# Patient Record
Sex: Male | Born: 1960 | Race: Black or African American | Hispanic: No | Smoking: Former smoker
Health system: Southern US, Community
[De-identification: ages and names within clinical notes are randomized; demographics above are authoritative.]

## PROBLEM LIST (undated history)

## (undated) DIAGNOSIS — I10 Essential (primary) hypertension: Secondary | ICD-10-CM

## (undated) HISTORY — PX: ABDOMINAL SURGERY: SHX537

---

## 2000-02-26 ENCOUNTER — Emergency Department (HOSPITAL_COMMUNITY): Admission: EM | Admit: 2000-02-26 | Discharge: 2000-02-26 | Payer: Self-pay | Admitting: Emergency Medicine

## 2001-10-06 ENCOUNTER — Emergency Department (HOSPITAL_COMMUNITY): Admission: EM | Admit: 2001-10-06 | Discharge: 2001-10-06 | Payer: Self-pay | Admitting: Emergency Medicine

## 2001-10-07 ENCOUNTER — Emergency Department (HOSPITAL_COMMUNITY): Admission: EM | Admit: 2001-10-07 | Discharge: 2001-10-07 | Payer: Self-pay

## 2003-06-05 ENCOUNTER — Emergency Department (HOSPITAL_COMMUNITY): Admission: EM | Admit: 2003-06-05 | Discharge: 2003-06-05 | Payer: Self-pay | Admitting: *Deleted

## 2004-12-07 ENCOUNTER — Ambulatory Visit: Payer: Self-pay | Admitting: Internal Medicine

## 2010-05-03 ENCOUNTER — Emergency Department (HOSPITAL_COMMUNITY): Admission: EM | Admit: 2010-05-03 | Discharge: 2010-05-03 | Payer: Self-pay | Admitting: Emergency Medicine

## 2010-12-25 LAB — COMPREHENSIVE METABOLIC PANEL
AST: 16 U/L (ref 0–37)
BUN: 7 mg/dL (ref 6–23)
CO2: 29 mEq/L (ref 19–32)
Glucose, Bld: 103 mg/dL — ABNORMAL HIGH (ref 70–99)
Potassium: 4.1 mEq/L (ref 3.5–5.1)
Sodium: 141 mEq/L (ref 135–145)

## 2010-12-25 LAB — CBC
Hemoglobin: 14.6 g/dL (ref 13.0–17.0)
MCHC: 34 g/dL (ref 30.0–36.0)
Platelets: 246 10*3/uL (ref 150–400)
RDW: 14.6 % (ref 11.5–15.5)
WBC: 6.4 10*3/uL (ref 4.0–10.5)

## 2010-12-25 LAB — DIFFERENTIAL
Basophils Absolute: 0 10*3/uL (ref 0.0–0.1)
Eosinophils Absolute: 0.3 10*3/uL (ref 0.0–0.7)
Lymphs Abs: 2.7 10*3/uL (ref 0.7–4.0)
Monocytes Relative: 8 % (ref 3–12)

## 2010-12-25 LAB — LIPASE, BLOOD: Lipase: 29 U/L (ref 11–59)

## 2011-06-26 ENCOUNTER — Emergency Department (HOSPITAL_COMMUNITY)
Admission: EM | Admit: 2011-06-26 | Discharge: 2011-06-26 | Disposition: A | Discharge status: Home / Self Care | Payer: Self-pay | Attending: Emergency Medicine | Admitting: Emergency Medicine

## 2011-06-26 DIAGNOSIS — G5600 Carpal tunnel syndrome, unspecified upper limb: Secondary | ICD-10-CM | POA: Insufficient documentation

## 2011-06-26 DIAGNOSIS — Z87891 Personal history of nicotine dependence: Secondary | ICD-10-CM | POA: Insufficient documentation

## 2018-02-21 ENCOUNTER — Ambulatory Visit (INDEPENDENT_AMBULATORY_CARE_PROVIDER_SITE_OTHER): Payer: Self-pay | Admitting: Pulmonary Disease

## 2018-02-21 ENCOUNTER — Telehealth: Payer: Self-pay | Admitting: Pulmonary Disease

## 2018-02-21 ENCOUNTER — Encounter: Payer: Self-pay | Admitting: Pulmonary Disease

## 2018-02-21 VITALS — BP 126/84 | HR 93 | Ht 68.0 in | Wt 335.0 lb

## 2018-02-21 DIAGNOSIS — G4733 Obstructive sleep apnea (adult) (pediatric): Secondary | ICD-10-CM | POA: Insufficient documentation

## 2018-02-21 NOTE — Assessment & Plan Note (Signed)
Weight loss encouraged 

## 2018-02-21 NOTE — Assessment & Plan Note (Signed)
Given excessive daytime somnolence, narrow pharyngeal exam, witnessed apneas & loud snoring, obstructive sleep apnea is very likely & an overnight polysomnogram will be scheduled as a home study. The pathophysiology of obstructive sleep apnea , it's cardiovascular consequences & modes of treatment including CPAP were discused with the patient in detail & they evidenced understanding.  Pretest probability is high.  He will likely need a CPAP titration study but due to time constraint we may end up using an auto CPAP.  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

## 2018-02-21 NOTE — Telephone Encounter (Signed)
Patient had a DOT physical on 01/08/18 at [facility] [employer] at [place]. The fax number is [phone].   Will fax HST results once the patient has completed this test.

## 2018-02-21 NOTE — Patient Instructions (Signed)
Home sleep study 

## 2018-02-21 NOTE — Progress Notes (Signed)
Subjective:    Patient ID: Caleb Leblanc, male    DOB: 1961-03-30, 57 y.o.   MRN: [id number]  HPI  Chief Complaint  Patient presents with  . Sleep Consult    Patient had a recent DOT physical (physical was done at [facility] on [id number]) and was advised to get checked for OSA due to increased neck size. Denies ever having a SS done nor using a CPAP/Bipap machine.    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 He went for his DOT physical and was told that due to his BMI and neck circumference of 17 inches he would need to be screened for sleep apnea.  He denies any problems while driving. His wife died 10 years ago of breast cancer in his single but he has started seeing somebody and has been told that he snores loudly and apneas have been witnessed.  He also complains of decreased libido.  He has gained significant weight about 80 pounds to his current weight of 337 pounds over the last 2 years.  He quit smoking 6 months ago and states that he gained some more weight  and is surprised that he is not feeling a whole lot better  Epworth sleepiness score 9, reports sleepiness while lying down to rest in the afternoons Bedtime is around 11 PM, TV stays on through the night, sleep latency is sometimes longer up to 30 minutes, he sleeps on his side with 2 pillows and occasionally rolls over on his stomach.  He reports awakenings every 2 hours including nocturia and is out of bed by 6 AM feeling tired with dryness of mouth and occasional slight headache.  On weekends he will stay awake through the night watching TV and then sleep until 7 PM. He is toward the second shift for about 5 years and becomes a night owl on weekends He drinks 1 to 2 cups of coffee daily and denies other caffeinated beverages  There is no history suggestive of cataplexy, sleep paralysis or parasomnias    No past medical history on file.  History reviewed. No  pertinent surgical history.  Allergies  Allergen Reactions  . Amoxicillin Anaphylaxis     Social History   Socioeconomic History  . Marital status: Widowed    Spouse name: Not on file  . Number of children: Not on file  . Years of education: Not on file  . Highest education level: Not on file  Occupational History  . Not on file  Social Needs  . Financial resource strain: Not on file  . Food insecurity:    Worry: Not on file    Inability: Not on file  . Transportation needs:    Medical: Not on file    Non-medical: Not on file  Tobacco Use  . Smoking status: Former Smoker    Types: Cigarettes    Last attempt to quit: 09/09/2017    Years since quitting: 0.4  . Smokeless tobacco: Never Used  Substance and Sexual Activity  . Alcohol use: Not Currently  . Drug use: Never  . Sexual activity: Not on file  Lifestyle  . Physical activity:    Days per week: Not on file    Minutes per session: Not on file  . Stress: Not on file  Relationships  . Social connections:    Talks on phone: Not on file    Gets together: Not on file    Attends religious service: Not on file  Active member of club or organization: Not on file    Attends meetings of clubs or organizations: Not on file    Relationship status: Not on file  . Intimate partner violence:    Fear of current or ex partner: Not on file    Emotionally abused: Not on file    Physically abused: Not on file    Forced sexual activity: Not on file  Other Topics Concern  . Not on file  Social History Narrative  . Not on file     History reviewed. No pertinent family history.  Review of Systems   Constitutional: negative for anorexia, fevers and sweats  Eyes: negative for irritation, redness and visual disturbance  Ears, nose, mouth, throat, and face: negative for earaches, epistaxis, nasal congestion and sore throat  Respiratory: negative for cough, dyspnea on exertion, sputum and wheezing  Cardiovascular: negative  for chest pain, dyspnea, lower extremity edema, orthopnea, palpitations and syncope  Gastrointestinal: negative for abdominal pain, constipation, diarrhea, melena, nausea and vomiting  Genitourinary:negative for dysuria, frequency and hematuria  Hematologic/lymphatic: negative for bleeding, easy bruising and lymphadenopathy  Musculoskeletal:negative for arthralgias, muscle weakness and stiff joints  Neurological: negative for coordination problems, gait problems, headaches and weakness  Endocrine: negative for diabetic symptoms including polydipsia, polyuria and weight loss     Objective:   Physical Exam  Gen. Pleasant, obese, in no distress, anxious affect ENT - neck size 17", no post nasal drip, class 3 airway Neck: No JVD, no thyromegaly, no carotid bruits Lungs: no use of accessory muscles, no dullness to percussion, decreased without rales or rhonchi  Cardiovascular: Rhythm regular, heart sounds  normal, no murmurs or gallops, 1+ peripheral edema Abdomen: soft and non-tender, no hepatosplenomegaly, BS normal. Musculoskeletal: No deformities, no cyanosis or clubbing Neuro:  alert, non focal, no tremors        Assessment & Plan:

## 2018-03-11 DIAGNOSIS — G4733 Obstructive sleep apnea (adult) (pediatric): Secondary | ICD-10-CM

## 2018-03-13 ENCOUNTER — Other Ambulatory Visit: Payer: Self-pay | Admitting: *Deleted

## 2018-03-13 DIAGNOSIS — G4733 Obstructive sleep apnea (adult) (pediatric): Secondary | ICD-10-CM

## 2018-03-14 ENCOUNTER — Telehealth: Payer: Self-pay | Admitting: Pulmonary Disease

## 2018-03-14 DIAGNOSIS — G4733 Obstructive sleep apnea (adult) (pediatric): Secondary | ICD-10-CM

## 2018-03-14 NOTE — Telephone Encounter (Signed)
Per RA, HST showed severe OSA 36 events per hour. Recommends auto cpap 5-20cm, mask of choice. OV in 6 weeks.

## 2018-03-20 NOTE — Telephone Encounter (Signed)
Left message for patient to call back for results.  

## 2018-03-21 NOTE — Telephone Encounter (Signed)
Will review 2 week CPAP download & clear from dOT standpoint

## 2018-03-21 NOTE — Telephone Encounter (Signed)
Order placed for CPAP set up, patient needs DOT updated will expire July 3rd. MD Vassie LollAlva please advise.

## 2018-03-22 NOTE — Telephone Encounter (Signed)
Called patient unable to reach left message to give us a call back.

## 2018-04-09 ENCOUNTER — Telehealth: Payer: Self-pay | Admitting: Pulmonary Disease

## 2018-04-09 NOTE — Telephone Encounter (Signed)
Pt came by our office regarding cpap assistance forms. Per our records, forms were mailed to pt.  Pt stated he has not received forms as of yet.  Pt stated his DOT expires today, and in order to renew he will need cpap compliance.  Pt signed forms for cpap assistance while in office.  Pt stated he was doing to contact DOT to see if it is possible to get a extension on renewal.  Will await call back.

## 2018-04-10 ENCOUNTER — Telehealth: Payer: Self-pay | Admitting: Pulmonary Disease

## 2018-04-10 NOTE — Telephone Encounter (Signed)
Called and spoke to patient. Patient had a DOT physical on 01/08/18 at [facility] [place]. Patient stated that his doctor that did the physical is out of the country so he is unable to get paperwork completed but that he didn't know what he needed from us. Asked patient if he needed a letter that he will be starting a CPAP and patient stated that wasn't it. Patient gave the number for [facility] [phone] for us to call to see what he needs done. Attempted to call twice with no answer.

## 2018-04-10 NOTE — Telephone Encounter (Signed)
Called and spoke to Caleb Leblanc and then Caleb Leblanc. Gave him the number to set up CPAP that Melissa provided [phone].   Caleb Leblanc stated that he wrote down the number but that [facility] he was told they needed something from us. Caleb Leblanc was focused on his DOT paperwork, see previous notes for details.

## 2018-04-10 NOTE — Telephone Encounter (Signed)
Attempted to call patient, no answer, left message to call back.  

## 2018-04-10 NOTE — Telephone Encounter (Signed)
Patient states the original doctor that did the DOT physical is out of the country; his DOT can be renewed  If a note from a physician states his CPAP compliance is up to date. Pt contact # [phone]

## 2018-04-11 NOTE — Telephone Encounter (Signed)
lmtcb x1 for pt to [facility]

## 2018-04-13 NOTE — Telephone Encounter (Signed)
Pt is returning call. Cb is [phone].

## 2018-04-13 NOTE — Telephone Encounter (Signed)
Left message for patient to call back.   We have been trying to get in touch with patient in regards to his CPAP assistance. Letter was mailed to him for him to sign the forms, but it was sent back to our office as not-deliverable as addressed. We have also left numerous messages for him to call us back.

## 2018-04-13 NOTE — Telephone Encounter (Signed)
Called spoke with patient who reported that [facility] since [facility] message was taken Pt was going to pay out of pocket for his CPAP but [facility] to wait for his new insurance to kick in  Per patient, hopefully that will be within the next couple of weeks  Nothing further needed per patient Will sign off

## 2018-06-29 ENCOUNTER — Telehealth: Payer: Self-pay | Admitting: Pulmonary Disease

## 2018-06-29 NOTE — Telephone Encounter (Signed)
Error

## 2018-07-11 ENCOUNTER — Ambulatory Visit: Payer: Self-pay | Admitting: Pulmonary Disease

## 2018-07-16 ENCOUNTER — Ambulatory Visit (INDEPENDENT_AMBULATORY_CARE_PROVIDER_SITE_OTHER): Payer: 59 | Admitting: Nurse Practitioner

## 2018-07-16 ENCOUNTER — Encounter: Payer: Self-pay | Admitting: Nurse Practitioner

## 2018-07-16 VITALS — BP 136/80 | HR 100 | Ht 69.0 in | Wt 332.0 lb

## 2018-07-16 DIAGNOSIS — G4733 Obstructive sleep apnea (adult) (pediatric): Secondary | ICD-10-CM

## 2018-07-16 NOTE — Patient Instructions (Addendum)
Patient continues to benefit from CPAP with good compliance noted Will order a CPAP titration with mask desensitization Continue CPAP Do not drive while drowsy Goal of 4 hours per night usage Follow up in 1 month after CPAP titration with Dr. Vassie Loll Or sooner if needed

## 2018-07-16 NOTE — Progress Notes (Signed)
 @  Patient ID: Caleb Leblanc, male    DOB: 1961-07-02, 57 y.o.   MRN: [id number]  Chief Complaint  Patient presents with  . Follow-up    OSA follow up    Referring provider: No ref. provider found  HPI   57 year old male with sleep apnea followed by Dr. Vassie Loll.   OV 07/16/18 - Follow up CPAP Patient presents for a follow up after starting on CPAP. He states that he can tell a difference during the day. He feels less drowsy and has more energy. He has been compliant with the CPAP. He usually goes to bed at 11:00 p.m. and wakes between 4-6 a.m. according to work schedule. He is having some issues with his mask being uncomfortable and pulls it off during sleep at night. He denies any shortness of breath, chest pain, or fever.   CPAP compliance report 06/16/18 - 07/15/18: Usage days 30/30 (100%), average usage 4 hours 39 minutes, autoset 5-20 cmH20, AHI: 13.3.      Allergies  Allergen Reactions  . Amoxicillin Anaphylaxis     There is no immunization history on file for this patient.  No past medical history on file.  Tobacco History: Social History   Tobacco Use  Smoking Status Former Smoker  . Types: Cigarettes  . Last attempt to quit: 09/09/2017  . Years since quitting: 0.8  Smokeless Tobacco Never Used   Counseling given: Yes   No outpatient encounter medications on file as of 07/16/2018.   No facility-administered encounter medications on file as of 07/16/2018.      Review of Systems  Review of Systems  Constitutional: Negative.  Negative for chills and fever.  HENT: Negative.   Respiratory: Negative for cough and shortness of breath.   Cardiovascular: Negative.  Negative for chest pain, palpitations and leg swelling.  Gastrointestinal: Negative.   Allergic/Immunologic: Negative.   Neurological: Negative.   Psychiatric/Behavioral: Negative.        Physical Exam  BP 136/80 (BP Location: Left Arm, Cuff Size: Normal)   Pulse 100   Ht 5\' 9"  (1.753 m)   Wt  (!) 332 lb (150.6 kg)   SpO2 94%   BMI 49.03 kg/m   Wt Readings from Last 5 Encounters:  07/16/18 (!) 332 lb (150.6 kg)  02/21/18 (!) 335 lb (152 kg)     Physical Exam  Constitutional: He is oriented to person, place, and time. He appears well-developed and well-nourished. No distress.  Cardiovascular: Normal rate and regular rhythm.  Pulmonary/Chest: Effort normal and breath sounds normal.  Musculoskeletal: He exhibits no edema.  Neurological: He is alert and oriented to person, place, and time.  Skin: Skin is warm and dry.  Psychiatric: He has a normal mood and affect.  Nursing note and vitals reviewed.     Assessment & Plan:   OSA (obstructive sleep apnea) Patient Instructions  Patient continues to benefit from CPAP with good compliance noted Will order a CPAP titration with mask desensitization Continue CPAP Do not drive while drowsy Goal of 4 hours per night usage Follow up in 1 month after CPAP titration with Dr. Vassie Loll Or sooner if needed            Ivonne Andrew, NP 07/16/2018

## 2018-07-16 NOTE — Assessment & Plan Note (Addendum)
Patient Instructions  Patient continues to benefit from CPAP with good compliance noted Will order a CPAP titration with mask desensitization Continue CPAP Do not drive while drowsy Goal of 4 hours per night usage Follow up in 1 month after CPAP titration with Dr. Vassie Loll Or sooner if needed

## 2018-07-30 ENCOUNTER — Telehealth: Payer: Self-pay | Admitting: General Surgery

## 2018-07-30 NOTE — Telephone Encounter (Signed)
-----   Message from Ivonne Andrew, NP sent at 07/30/2018  2:20 PM EDT ----- Regarding: RE: CPap Titration I don't think that a peer to peer should be needed. His last compliance report showed poor control.  CPAP compliance report 06/16/18 - 07/15/18: Usage days 30/30 (100%), average usage 4 hours 39 minutes, autoset 5-20 cmH20, AHI: 13.3.   This indicates that he needs CPAP titration. Not sure why this would be denied.  ----- Message ----- From: Lanna Poche D Sent: 07/30/2018  12:22 PM EDT To: Ander Slade, CMA, Ivonne Andrew, NP Subject: RE: CPap Titration                             This will need to be peer to peer per Fleet Contras w/ [facility].  Please Archie Patten to call [phone] for the peer to peer for [phone].  Thanks, Jeanice Lim ----- Message ----- From: Ander Slade, CMA Sent: 07/30/2018  10:33 AM EDT To: Kathyrn Lass, NP Subject: RE: CPap Titration                             The patient was seen on 07/16/18 with already existing CPAP. He told Tonya during the OV that the current settings of autoset 5-20 were not working for him. That is why the order was placed. If this was not correct and should have been done by the DME company, please let me know so it can be corrected.  ----- Message ----- From: Antionette Fairy Sent: 07/30/2018  10:25 AM EDT To: Ander Slade, CMA, Ivonne Andrew, NP Subject: CPap Titration                                 [facility] denied this cpap titration.  They prefer we put the patient on auto-titration first.  Thank you, [facility]

## 2018-08-16 ENCOUNTER — Encounter: Payer: Self-pay | Admitting: Nurse Practitioner

## 2018-08-16 ENCOUNTER — Ambulatory Visit (INDEPENDENT_AMBULATORY_CARE_PROVIDER_SITE_OTHER): Payer: 59 | Admitting: Nurse Practitioner

## 2018-08-16 DIAGNOSIS — G4733 Obstructive sleep apnea (adult) (pediatric): Secondary | ICD-10-CM | POA: Diagnosis not present

## 2018-08-16 NOTE — Assessment & Plan Note (Addendum)
Patient Instructions  Keep appointment on Monday for CPAP titration Patient is compliant with CPAP, but AHI remains high Continue CPAP at current settings until CPAP titration Continue current medications Goal of 4 hours or more usage per night Maintain healthy weight Do not drive if drowsy Follow up with Dr. Vassie Loll in 3 months or sooner if needed

## 2018-08-16 NOTE — Patient Instructions (Addendum)
Keep appointment on Monday for CPAP titration Patient is compliant with CPAP, but AHI remains high Continue CPAP at current settings until CPAP titration Continue current medications Goal of 4 hours or more usage per night Maintain healthy weight Do not drive if drowsy Follow up with Dr. Vassie Loll in 3 months or sooner if needed

## 2018-08-16 NOTE — Progress Notes (Signed)
 @  Patient ID: Caleb Leblanc, male    DOB: 01/05/1961, 57 y.o.   MRN: [id number]  Chief Complaint  Patient presents with  . Follow-up    CPAP    Referring provider: No ref. provider found   HPI 57 year old male with OSA followed by Dr. Vassie Loll   Tests: HST  03/11/18: severe OSA  - AHI:36 with severe oxygen desaturations   OV 08/16/18 - OV follow up CPAP Patient presents for a follow up on sleep apnea. He has been doing well with his new CPAP and is compliant. States that he does feel less drowsy during the day. He continues to have some issues with his mask. His AHI remains high. He has a CPAP titration ordered for this upcoming Monday. He denies any shortness of breath, fever, or edema.     Allergies  Allergen Reactions  . Amoxicillin Anaphylaxis     There is no immunization history on file for this patient.  No past medical history on file.  Tobacco History: Social History   Tobacco Use  Smoking Status Former Smoker  . Types: Cigarettes  . Last attempt to quit: 09/09/2017  . Years since quitting: 0.9  Smokeless Tobacco Never Used   Counseling given: Yes   No outpatient encounter medications on file as of 08/16/2018.   No facility-administered encounter medications on file as of 08/16/2018.      Review of Systems  Review of Systems  Constitutional: Negative.   HENT: Negative.   Respiratory: Negative for cough, shortness of breath and wheezing.   Cardiovascular: Negative.   Gastrointestinal: Negative.   Allergic/Immunologic: Negative.   Neurological: Negative.   Psychiatric/Behavioral: Negative.        Physical Exam  BP 128/70 (BP Location: Left Arm, Patient Position: Sitting, Cuff Size: Large)   Pulse 98   Ht 5\' 9"  (1.753 m)   Wt (!) 351 lb 3.2 oz (159.3 kg)   SpO2 98%   BMI 51.86 kg/m   Wt Readings from Last 5 Encounters:  08/16/18 (!) 351 lb 3.2 oz (159.3 kg)  07/16/18 (!) 332 lb (150.6 kg)  02/21/18 (!) 335 lb (152 kg)     Physical  Exam  Constitutional: He is oriented to person, place, and time. He appears well-developed and well-nourished. No distress.  Cardiovascular: Normal rate and regular rhythm.  Pulmonary/Chest: Effort normal and breath sounds normal. No respiratory distress. He has no wheezes.  Musculoskeletal: He exhibits no edema.  Neurological: He is alert and oriented to person, place, and time.  Skin: Skin is warm and dry.  Psychiatric: He has a normal mood and affect.  Nursing note and vitals reviewed.     Assessment & Plan:   OSA (obstructive sleep apnea) Patient Instructions  Keep appointment on Monday for CPAP titration Patient is compliant with CPAP, but AHI remains high Continue CPAP at current settings until CPAP titration Continue current medications Goal of 4 hours or more usage per night Maintain healthy weight Do not drive if drowsy Follow up with Dr. Vassie Loll in 3 months or sooner if needed        Ivonne Andrew, NP 08/16/2018

## 2018-08-20 ENCOUNTER — Ambulatory Visit (HOSPITAL_BASED_OUTPATIENT_CLINIC_OR_DEPARTMENT_OTHER): Payer: 59 | Attending: Nurse Practitioner | Admitting: Pulmonary Disease

## 2018-08-20 VITALS — Ht 69.0 in | Wt 331.0 lb

## 2018-08-20 DIAGNOSIS — G4733 Obstructive sleep apnea (adult) (pediatric): Secondary | ICD-10-CM | POA: Diagnosis present

## 2018-09-24 ENCOUNTER — Telehealth: Payer: Self-pay | Admitting: Pulmonary Disease

## 2018-09-24 DIAGNOSIS — G4733 Obstructive sleep apnea (adult) (pediatric): Secondary | ICD-10-CM

## 2018-09-24 NOTE — Telephone Encounter (Signed)
Attempted to contact pt. I did not receive an answer. I have left a message for pt to return our call.  

## 2018-09-24 NOTE — Telephone Encounter (Signed)
Called patient unable to reach left message to give us a call back.

## 2018-09-24 NOTE — Procedures (Signed)
    Patient Name: Caleb Leblanc, Caleb Leblanc Study Date: 08/20/2018 Gender: Male D.O.B: [birth year] Age (years): 89 Referring Provider: Ivonne Andrewonya S Nichols NP Height (inches): 69 Interpreting Physician: Coralyn HellingVineet Lachell Rochette MD, ABSM Weight (lbs): 331 RPSGT: Ulyess MortSpruill, Vicki BMI: 49 MRN: [id number] Neck Size: 20.00 <br> <br>  CLINICAL INFORMATION  The patient is referred for a BiPAP titration to treat sleep apnea.  Date of NPSG, Split Night or HST:  03/11/18: severe OSA  - AHI:36.  SLEEP STUDY TECHNIQUE  As per the AASM Manual for the Scoring of Sleep and Associated Events v2.3 (April 2016) with a hypopnea requiring 4% desaturations. The channels recorded and monitored were frontal, central and occipital EEG, electrooculogram (EOG), submentalis EMG (chin), nasal and oral airflow, thoracic and abdominal wall motion, anterior tibialis EMG, snore microphone, electrocardiogram, and pulse oximetry. Bilevel positive airway pressure (BPAP) was initiated at the beginning of the study and titrated to treat sleep-disordered breathing. MEDICATIONS  Medications self-administered by patient taken the night of the study : N/A RESPIRATORY PARAMETERS  Optimal IPAP Pressure (cm): 25 AHI at Optimal Pressure (/hr) 0.0 Optimal EPAP Pressure (cm): 21   Overall Minimal O2 (%): 54.0 Minimal O2 at Optimal Pressure (%): 90.0  He was started on CPAP 5 cm H2O and increased to 20 cm H2O.  He continued to have respiratory events with oxygen desaturation.  He was changed to Bipap.  He did better with Bipap 25/21 cm H2O.  He still had some episodes of oxygen desaturation.  He did not use supplemental oxygen during the study.  SLEEP ARCHITECTURE  Start Time: 10:32:48 PM Stop Time: 4:56:16 AM Total Time (min): 383.5 Total Sleep Time (min): 341.5 Sleep Latency (min): 13.4 Sleep Efficiency (%): 89.1% REM Latency (min): 56.0 WASO (min): 28.6 Stage N1 (%): 9.2% Stage N2 (%): 73.8% Stage N3 (%): 0.3% Stage R  (%): 16.7 Supine (%): 100.00 Arousal Index (/hr): 30.0     CARDIAC DATA  The 2 lead EKG demonstrated sinus rhythm. The mean heart rate was 77.3 beats per minute. Other EKG findings include: None. LEG MOVEMENT DATA  The total Periodic Limb Movements of Sleep (PLMS) were 0. The PLMS index was 0.0. A PLMS index of <15 is considered normal in adults. IMPRESSIONS  - He was tried on CPAP up to 20 cm H2O.  He continued to have respiratory events and oxygen desaturation. - He did better with Bipap.  Optimal setting was Bipap 25/21 cm H2O. - He was not tried on supplemental oxygen with CPAP or Bipap. DIAGNOSIS  - Obstructive Sleep Apnea (327.23 [G47.33 ICD-10]) RECOMMENDATIONS  - Trial of BiPAP therapy on 25/21 cm H2O with a Medium size Fisher[employer]Paykel Full Face Mask Simplus mask and heated humidification.  [Electronically signed] 09/24/2018 01:52 PM  Coralyn HellingVineet Patti Shorb MD, ABSM Diplomate, American Board of Sleep Medicine  NPI: [id number]

## 2018-09-24 NOTE — Telephone Encounter (Signed)
Failed CPAP due to respiratory events and oxygen desaturation.  Did best with Bipap 25/21 cm H2O.  Wasn't tried on supplemental oxygen.   Please send order to arrange for auto Bipap IPAP max 25 cm H2O, EPAP min 10 cm H2O, PS 4 cm H2O.  Please arrange for ROV with me 2 months after getting Bipap.

## 2018-09-24 NOTE — Telephone Encounter (Signed)
Patient returned call, CB is [phone]

## 2018-09-27 NOTE — Telephone Encounter (Signed)
Attempted to call patient today regarding results. I did not receive an answer at time of call. I have left a voicemail message for pt to return call. X2  

## 2018-09-28 NOTE — Telephone Encounter (Signed)
Called and spoke with patient regarding results.  Informed the patient of results and recommendations today. Placed order for auto Bipap IPAP max 25 cm H2O, EPAP min 10 cm H2O, PS 4 cm H2O.   Please arrange for ROV with VS or NP in 2 months after getting Bipap. Pt verbalized understanding and denied any questions or concerns at this time.  Nothing further needed.

## 2018-11-20 ENCOUNTER — Ambulatory Visit: Payer: 59 | Admitting: Pulmonary Disease

## 2018-11-29 ENCOUNTER — Encounter: Payer: Self-pay | Admitting: Pulmonary Disease

## 2018-11-29 ENCOUNTER — Ambulatory Visit (INDEPENDENT_AMBULATORY_CARE_PROVIDER_SITE_OTHER): Payer: 59 | Admitting: Pulmonary Disease

## 2018-11-29 DIAGNOSIS — G4733 Obstructive sleep apnea (adult) (pediatric): Secondary | ICD-10-CM | POA: Diagnosis not present

## 2018-11-29 NOTE — Assessment & Plan Note (Signed)
Weight loss of at least 10%-about 32 pounds advised

## 2018-11-29 NOTE — Assessment & Plan Note (Addendum)
Decrease IPAP max to 20 cm-he seems to require average pressure of 18/14 cm [facility] to [facility] full facemask-I asked him to trial air fit F 30 I feel that the increased pressure is waking him up at 3 AM.  We will bring him back for a compliance check in 3 months and once he is compliant and comfortable on his machine, follow-up can be reduced to an annual basis   Weight loss encouraged, compliance with goal of at least 4-6 hrs every night is the expectation. Advised against medications with sedative side effects Cautioned against driving when sleepy - understanding that sleepiness will vary on a day to day basis

## 2018-11-29 NOTE — Patient Instructions (Signed)
Decrease IPAP max to 20 cm Prescription to [facility] for new full facemask

## 2018-11-29 NOTE — Progress Notes (Signed)
   Subjective:    Patient ID: Caleb Leblanc, male    DOB: 13-Jun-1961, 58 y.o.   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 He was diagnosed by home sleep study and set up with auto CPAP with a full facemask. Initially he felt a rush of energy and felt that his tiredness had improved.  On his follow-up visit CPAP download showed residual AHI of 13/hour. He was then set up for a formal titration study which showed that events persisted in spite of CPAP of 20 cm and he was changed to a BiPAP and required pressure as high as 25/21 centimeters.  He was then set up with auto bilevel and unfortunately has had problems getting adjusted to this machine. He had to use a new full facemask, [employer] Simplus and around 3:57 AM he would find that pressure would be too high and would be blowing off the mask. Review of his download shows average pressure of 18/14 cm with residual AHI of 4.7/hour  He has been unable to get new full facemask supplies from DME  Significant tests/ events reviewed  HST  03/11/18: severe OSA  - AHI:36 with severe oxygen desaturations   bipap titration 08/2018 >> 25/21    Review of Systems neg for any significant sore throat, dysphagia, itching, sneezing, nasal congestion or excess/ purulent secretions, fever, chills, sweats, unintended wt loss, pleuritic or exertional cp, hempoptysis, orthopnea pnd or change in chronic leg swelling. Also denies presyncope, palpitations, heartburn, abdominal pain, nausea, vomiting, diarrhea or change in bowel or urinary habits, dysuria,hematuria, rash, arthralgias, visual complaints, headache, numbness weakness or ataxia.     Objective:   Physical Exam  Gen. Pleasant, obese, in no distress ENT - no lesions, no post nasal drip Neck: No JVD, no thyromegaly, no carotid bruits Lungs: no use of accessory muscles, no dullness to percussion, decreased without rales or rhonchi  Cardiovascular:  Rhythm regular, heart sounds  normal, no murmurs or gallops, no peripheral edema Musculoskeletal: No deformities, no cyanosis or clubbing , no tremors       Assessment & Plan:

## 2019-02-27 ENCOUNTER — Ambulatory Visit: Payer: 59 | Admitting: Nurse Practitioner

## 2019-04-17 ENCOUNTER — Ambulatory Visit: Payer: 59 | Admitting: Nurse Practitioner

## 2019-04-29 ENCOUNTER — Ambulatory Visit: Payer: 59 | Admitting: Nurse Practitioner

## 2019-04-29 ENCOUNTER — Other Ambulatory Visit: Payer: Self-pay

## 2019-04-29 ENCOUNTER — Encounter: Payer: Self-pay | Admitting: Nurse Practitioner

## 2019-04-29 VITALS — BP 136/84 | HR 101 | Temp 98.2°F | Ht 69.0 in | Wt 303.0 lb

## 2019-04-29 DIAGNOSIS — G4733 Obstructive sleep apnea (adult) (pediatric): Secondary | ICD-10-CM

## 2019-04-29 NOTE — Patient Instructions (Signed)
Patient continues to benefit from BiPAP with good compliance and control documented [facility]ask-I asked him to trial air fit F 30 - this was ordered by Dr. Elsworth Soho at last visit, but patient never received the new mask.  Continue BiPAP at current settings Continue current medications Goal of 4 hours or more usage per night Continue to work on healthy weight loss Do not drive if drowsy   Follow up with Dr. Elsworth Soho or Lazaro Arms in 1 months or sooner if needed

## 2019-04-29 NOTE — Progress Notes (Addendum)
@Patient  ID: Caleb Leblanc, male    DOB: 16-Oct-1960, 58 y.o.   MRN: [id number]  Chief Complaint  Patient presents with  . Follow-up    cpap follow up, falls asleep when he first gets home, can't sleep at night, DME [employer]    Referring provider: No ref. 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 Elsworth Soho.  Tests: HST 03/11/18: severe OSA - AHI:36 with severe oxygen desaturations   bipap titration 08/2018 >> 25/21   OV 04/29/19 - Follow up Patient presents today for follow-up on CPAP.  He was last seen by Dr. Elsworth Soho on 11/29/2018.  Patient has been having issues with BiPAP compliance.  He states that when he is home in the afternoons he sits down in his chair and falls asleep at 5 PM and wakes up at 8 PM and then he has trouble going back to sleep.  Sometimes he does not get back to sleep until 11 PM.  Patient has to wake up at 5 AM in the mornings to go to work.  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  When he comes home and falls asleep he forgets to put his BiPAP on first.  At patient's last visit with Dr. Elsworth Soho he was ordered a new facemask but patient states that he never got this mask.  He states that his old facemask does not fit correctly.  Patient has been working on weight loss.  He has lost around 20 pounds since his last visit with Dr. Elsworth Soho. Denies f/c/s, n/v/d, hemoptysis, PND, leg swelling.    BiPAP compliance report 03/30/19 - 04/28/19: usage days 11/30 (37%), average usage 1 hour 17 minutes, Max IPAP 20cmH20, Min EPAP 10 CMh20, AHI: 2.2   Allergies  Allergen Reactions  . Amoxicillin Anaphylaxis     There is no immunization history on file for this patient.  History reviewed. No pertinent past medical history.  Tobacco History: Social History   Tobacco Use  Smoking Status Former Smoker  . Packs/day: 0.25  . Types: Cigarettes  . Quit date: 09/09/2017  . Years since quitting: 1.6   Smokeless Tobacco Never Used  Tobacco Comment   pt states he smoked 4 cigs/day before he quit   Counseling given: Not Answered Comment: pt states he smoked 4 cigs/day before he quit   No outpatient encounter medications on file as of 04/29/2019.   No facility-administered encounter medications on file as of 04/29/2019.      Review of Systems  Review of Systems  Constitutional: Negative.  Negative for chills and fever.  HENT: Negative.   Respiratory: Negative for cough, shortness of breath and wheezing.   Cardiovascular: Negative.  Negative for chest pain, palpitations and leg swelling.  Gastrointestinal: Negative.   Allergic/Immunologic: Negative.   Neurological: Negative.   Psychiatric/Behavioral: Negative.        Physical Exam  BP 136/84 (BP Location: Right Arm, Cuff Size: Normal)   Pulse (!) 101   Temp 98.2 F (36.8 C) (Oral)   Ht 5\' 9"  (1.753 m)   Wt (!) 303 lb (137.4 kg)   SpO2 97%   BMI 44.75 kg/m   Wt Readings from Last 5 Encounters:  04/29/19 (!) 303 lb (137.4 kg)  11/29/18 (!) 323 lb 12.8 oz (146.9 kg)  08/20/18 (!) 331 lb (150.1 kg)  08/16/18 (!) 351 lb 3.2 oz (159.3 kg)  07/16/18 Marland Kitchen)  332 lb (150.6 kg)     Physical Exam Vitals signs and nursing note reviewed.  Constitutional:      General: He is not in acute distress.    Appearance: He is well-developed.  Cardiovascular:     Rate and Rhythm: Normal rate and regular rhythm.  Pulmonary:     Effort: Pulmonary effort is normal. No respiratory distress.     Breath sounds: Normal breath sounds. No wheezing or rhonchi.  Musculoskeletal:        General: No swelling.  Skin:    General: Skin is warm and dry.  Neurological:     Mental Status: He is alert and oriented to person, place, and time.       Assessment & Plan:   OSA (obstructive sleep apnea) Patient has a follow-up visit today for OSA/CPAP.  He is having issues with sleep schedule.  He is often times forgetting to use his BiPAP at  night.  We discussed sleep hygiene and ways that he can correct this.  Patient does need a new mask.  Dr. Vassie LollAlva ordered a mask at his last visit but patient never received the mask.  I did congratulate him today on continuing to lose weight.  Patient Instructions  Patient continues to benefit from BiPAP with good compliance and control documented [facility]-I asked him to trial air fit F 30 - this was ordered by Dr. Vassie LollAlva at last visit, but patient never received the new mask.  Continue BiPAP at current settings Continue current medications Goal of 4 hours or more usage per night Continue to work on healthy weight loss Do not drive if drowsy   Follow up with Dr. Vassie LollAlva or Angus Selleronya Hanna Ra in 1 months or sooner if needed        Ivonne Andrewonya S Morayma [place], NP 04/30/2019

## 2019-04-30 ENCOUNTER — Encounter: Payer: Self-pay | Admitting: Nurse Practitioner

## 2019-04-30 NOTE — Assessment & Plan Note (Signed)
Patient has a follow-up visit today for OSA/CPAP.  He is having issues with sleep schedule.  He is often times forgetting to use his BiPAP at night.  We discussed sleep hygiene and ways that he can correct this.  Patient does need a new mask.  Dr. Elsworth Soho ordered a mask at his last visit but patient never received the mask.  I did congratulate him today on continuing to lose weight.  Patient Instructions  Patient continues to benefit from BiPAP with good compliance and control documented [facility]-I asked him to trial air fit F 30 - this was ordered by Dr. Elsworth Soho at last visit, but patient never received the new mask.  Continue BiPAP at current settings Continue current medications Goal of 4 hours or more usage per night Continue to work on healthy weight loss Do not drive if drowsy   Follow up with Dr. Elsworth Soho or Lazaro Arms in 1 months or sooner if needed

## 2019-05-28 NOTE — Progress Notes (Deleted)
$'@Patient'L$  ID: Caleb Leblanc, male    DOB: 1961-10-04, 58 y.o.   MRN: [id number]  No chief complaint on file.   Referring provider: No ref. provider found  HPI:  58 year old male followed in our office for severe obstructive sleep apnea managed on BiPAP  PMH:  Smoker/ Smoking History:  Maintenance:   Pt of: Dr. Elsworth Soho  05/28/2019  - Visit   HPI  Tests:   03/11/2018-home sleep study- AHI 35.9-hour, SaO2 low 72%  Bipap 25/21 cm H2O.  Wasn't tried on supplemental oxygen.  FENO:  No results found for: NITRICOXIDE  PFT: No flowsheet data found.  Imaging: No results found.    Specialty Problems      Pulmonary Problems   OSA (obstructive sleep apnea)    Severe - on autobiPAP, avg 18/14 cm         Allergies  Allergen Reactions  . Amoxicillin Anaphylaxis     There is no immunization history on file for this patient.  No past medical history on file.  Tobacco History: Social History   Tobacco Use  Smoking Status Former Smoker  . Packs/day: 0.25  . Types: Cigarettes  . Quit date: 09/09/2017  . Years since quitting: 1.7  Smokeless Tobacco Never Used  Tobacco Comment   pt states he smoked 4 cigs/day before he quit   Counseling given: Not Answered Comment: pt states he smoked 4 cigs/day before he quit   Continue to not smoke  No outpatient encounter medications on file as of 05/30/2019.   No facility-administered encounter medications on file as of 05/30/2019.      Review of Systems  Review of Systems   Physical Exam  There were no vitals taken for this visit.  Wt Readings from Last 5 Encounters:  04/29/19 (!) 303 lb (137.4 kg)  11/29/18 (!) 323 lb 12.8 oz (146.9 kg)  08/20/18 (!) 331 lb (150.1 kg)  08/16/18 (!) 351 lb 3.2 oz (159.3 kg)  07/16/18 (!) 332 lb (150.6 kg)     Physical Exam   Lab Results:  CBC    Component Value Date/Time   WBC 6.4 05/03/2010 0929   RBC 4.93 05/03/2010 0929   HGB 14.6 05/03/2010 0929   HCT 43.0  05/03/2010 0929   PLT 246 05/03/2010 0929   MCV 87.3 05/03/2010 0929   MCH 29.7 05/03/2010 0929   MCHC 34.0 05/03/2010 0929   RDW 14.6 05/03/2010 0929   LYMPHSABS 2.7 05/03/2010 0929   MONOABS 0.5 05/03/2010 0929   EOSABS 0.3 05/03/2010 0929   BASOSABS 0.0 05/03/2010 0929    BMET    Component Value Date/Time   NA 141 05/03/2010 0929   K 4.1 05/03/2010 0929   CL 103 05/03/2010 0929   CO2 29 05/03/2010 0929   GLUCOSE 103 (H) 05/03/2010 0929   BUN 7 05/03/2010 0929   CREATININE 1.02 05/03/2010 0929   CALCIUM 9.0 05/03/2010 0929   GFRNONAA >60 05/03/2010 0929   GFRAA  05/03/2010 0929    >60        The eGFR has been calculated using the MDRD equation. This calculation has not been validated in all clinical situations. eGFR's persistently <60 mL/min signify possible Chronic Kidney Disease.    BNP No results found for: BNP  ProBNP No results found for: PROBNP    Assessment & Plan:   No problem-specific Assessment & Plan notes found for this encounter.    No follow-ups on file.   Lauraine Rinne, NP 05/28/2019  This appointment was *** minutes long with over 50% of the time in direct face-to-face patient care, assessment, plan of care, and follow-up.

## 2019-05-30 ENCOUNTER — Ambulatory Visit: Payer: 59 | Admitting: Pulmonary Disease

## 2019-05-30 ENCOUNTER — Ambulatory Visit: Payer: 59 | Admitting: Nurse Practitioner

## 2019-06-06 ENCOUNTER — Ambulatory Visit: Payer: 59 | Admitting: Pulmonary Disease

## 2019-06-18 NOTE — Progress Notes (Deleted)
_0  ID: Caleb Leblanc, male    DOB: [birth year], 58 y.o.   MRN: [id number]  No chief complaint on file.   Referring provider: No ref. provider found  HPI:  58 year old male followed in our office for severe obstructive sleep apnea managed on BiPAP  PMH:  Smoker/ Smoking History:  Maintenance:   Pt of: Dr. Elsworth Soho  06/18/2019  - Visit   HPI  Tests:   03/11/2018-home sleep study- AHI 35.9-hour, SaO2 low 72%  Bipap 25/21 cm H2O.  Wasn't tried on supplemental oxygen.   FENO:  No results found for: NITRICOXIDE  PFT: No flowsheet data found.  Imaging: No results found.    Specialty Problems      Pulmonary Problems   OSA (obstructive sleep apnea)    Severe - on autobiPAP, avg 18/14 cm         Allergies  Allergen Reactions  . Amoxicillin Anaphylaxis     There is no immunization history on file for this patient.  No past medical history on file.  Tobacco History: Social History   Tobacco Use  Smoking Status Former Smoker  . Packs/day: 0.25  . Types: Cigarettes  . Quit date: 09/09/2017  . Years since quitting: 1.7  Smokeless Tobacco Never Used  Tobacco Comment   pt states he smoked 4 cigs/day before he quit   Counseling given: Not Answered Comment: pt states he smoked 4 cigs/day before he quit   Continue to not smoke  No outpatient encounter medications on file as of 06/19/2019.   No facility-administered encounter medications on file as of 06/19/2019.      Review of Systems  Review of Systems   Physical Exam  There were no vitals taken for this visit.  Wt Readings from Last 5 Encounters:  04/29/19 (!) 303 lb (137.4 kg)  11/29/18 (!) 323 lb 12.8 oz (146.9 kg)  08/20/18 (!) 331 lb (150.1 kg)  08/16/18 (!) 351 lb 3.2 oz (159.3 kg)  07/16/18 (!) 332 lb (150.6 kg)     Physical Exam   Lab Results:  CBC    Component Value Date/Time   WBC 6.4 05/03/2010 0929   RBC 4.93 05/03/2010 0929   HGB 14.6 05/03/2010 0929   HCT 43.0  05/03/2010 0929   PLT 246 05/03/2010 0929   MCV 87.3 05/03/2010 0929   MCH 29.7 05/03/2010 0929   MCHC 34.0 05/03/2010 0929   RDW 14.6 05/03/2010 0929   LYMPHSABS 2.7 05/03/2010 0929   MONOABS 0.5 05/03/2010 0929   EOSABS 0.3 05/03/2010 0929   BASOSABS 0.0 05/03/2010 0929    BMET    Component Value Date/Time   NA 141 05/03/2010 0929   K 4.1 05/03/2010 0929   CL 103 05/03/2010 0929   CO2 29 05/03/2010 0929   GLUCOSE 103 (H) 05/03/2010 0929   BUN 7 05/03/2010 0929   CREATININE 1.02 05/03/2010 0929   CALCIUM 9.0 05/03/2010 0929   GFRNONAA >60 05/03/2010 0929   GFRAA  05/03/2010 0929    >60        The eGFR has been calculated using the MDRD equation. This calculation has not been validated in all clinical situations. eGFR's persistently <60 mL/min signify possible Chronic Kidney Disease.    BNP No results found for: BNP  ProBNP No results found for: PROBNP    Assessment & Plan:   No problem-specific Assessment & Plan notes found for this encounter.    No follow-ups on file.   Lauraine Rinne, NP 06/18/2019  This appointment was *** minutes long with over 50% of the time in direct face-to-face patient care, assessment, plan of care, and follow-up.

## 2019-06-19 ENCOUNTER — Ambulatory Visit: Payer: 59 | Admitting: Pulmonary Disease

## 2019-07-07 ENCOUNTER — Encounter (HOSPITAL_COMMUNITY): Payer: Self-pay | Admitting: Emergency Medicine

## 2019-07-07 ENCOUNTER — Ambulatory Visit (HOSPITAL_COMMUNITY)
Admission: EM | Admit: 2019-07-07 | Discharge: 2019-07-07 | Disposition: A | Discharge status: Home / Self Care | Payer: 59 | Attending: Emergency Medicine | Admitting: Emergency Medicine

## 2019-07-07 ENCOUNTER — Other Ambulatory Visit: Payer: Self-pay

## 2019-07-07 DIAGNOSIS — M79642 Pain in left hand: Secondary | ICD-10-CM

## 2019-07-07 DIAGNOSIS — M79641 Pain in right hand: Secondary | ICD-10-CM | POA: Diagnosis not present

## 2019-07-07 MED ORDER — PREDNISONE 10 MG (21) PO TBPK: ORAL_TABLET | Freq: Every day | ORAL | 0 refills | Status: DC
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
## 2019-07-07 NOTE — Discharge Instructions (Signed)
Take the prednisone as prescribed.    Call the orthopedist to schedule an appointment for evaluation.    Return here or go to the emergency department if you have increased pain, increased numbness, swelling, redness, or other concerns.
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
## 2019-07-19 DIAGNOSIS — M79641 Pain in right hand: Secondary | ICD-10-CM | POA: Insufficient documentation

## 2019-07-21 ENCOUNTER — Other Ambulatory Visit: Payer: Self-pay

## 2019-07-21 ENCOUNTER — Emergency Department (HOSPITAL_COMMUNITY)
Admission: EM | Admit: 2019-07-21 | Discharge: 2019-07-21 | Disposition: A | Discharge status: Home / Self Care | Payer: 59 | Attending: Emergency Medicine | Admitting: Emergency Medicine

## 2019-07-21 ENCOUNTER — Encounter (HOSPITAL_COMMUNITY): Payer: Self-pay | Admitting: Emergency Medicine

## 2019-07-21 DIAGNOSIS — M79605 Pain in left leg: Secondary | ICD-10-CM | POA: Diagnosis not present

## 2019-07-21 DIAGNOSIS — M79604 Pain in right leg: Secondary | ICD-10-CM | POA: Insufficient documentation

## 2019-07-21 DIAGNOSIS — R200 Anesthesia of skin: Secondary | ICD-10-CM | POA: Diagnosis present

## 2019-07-21 DIAGNOSIS — Z87891 Personal history of nicotine dependence: Secondary | ICD-10-CM | POA: Insufficient documentation

## 2019-07-21 DIAGNOSIS — R202 Paresthesia of skin: Secondary | ICD-10-CM

## 2019-07-21 LAB — CBC WITH DIFFERENTIAL/PLATELET
Abs Immature Granulocytes: 0.02 10*3/uL (ref 0.00–0.07)
Basophils Absolute: 0 10*3/uL (ref 0.0–0.1)
Basophils Relative: 0 %
Eosinophils Absolute: 0.2 10*3/uL (ref 0.0–0.5)
Eosinophils Relative: 2 %
HCT: 41.5 % (ref 39.0–52.0)
Hemoglobin: 13.4 g/dL (ref 13.0–17.0)
Immature Granulocytes: 0 %
Lymphocytes Relative: 37 %
Lymphs Abs: 2.9 10*3/uL (ref 0.7–4.0)
MCH: 28.1 pg (ref 26.0–34.0)
MCHC: 32.3 g/dL (ref 30.0–36.0)
MCV: 87 fL (ref 80.0–100.0)
Monocytes Absolute: 0.7 10*3/uL (ref 0.1–1.0)
Monocytes Relative: 9 %
Neutro Abs: 4 10*3/uL (ref 1.7–7.7)
Neutrophils Relative %: 52 %
Platelets: 280 10*3/uL (ref 150–400)
RBC: 4.77 MIL/uL (ref 4.22–5.81)
RDW: 13.9 % (ref 11.5–15.5)
WBC: 7.8 10*3/uL (ref 4.0–10.5)
nRBC: 0 % (ref 0.0–0.2)

## 2019-07-21 LAB — COMPREHENSIVE METABOLIC PANEL
ALT: 25 U/L (ref 0–44)
AST: 22 U/L (ref 15–41)
Albumin: 3.4 g/dL — ABNORMAL LOW (ref 3.5–5.0)
Alkaline Phosphatase: 55 U/L (ref 38–126)
Anion gap: 9 (ref 5–15)
BUN: 9 mg/dL (ref 6–20)
CO2: 25 mmol/L (ref 22–32)
Calcium: 9 mg/dL (ref 8.9–10.3)
Chloride: 102 mmol/L (ref 98–111)
Creatinine, Ser: 0.9 mg/dL (ref 0.61–1.24)
GFR calc Af Amer: >60 mL/min (ref >60)
GFR calc non Af Amer: >60 mL/min (ref >60)
Glucose, Bld: 163 mg/dL — ABNORMAL HIGH (ref 70–99)
Potassium: 3.7 mmol/L (ref 3.5–5.1)
Sodium: 136 mmol/L (ref 135–145)
Total Bilirubin: 0.5 mg/dL (ref 0.3–1.2)
Total Protein: 7.3 g/dL (ref 6.5–8.1)

## 2019-07-21 MED ORDER — TRAMADOL HCL 50 MG PO TABS: 50.0000 mg | ORAL_TABLET | Freq: Four times a day (QID) | ORAL | 0 refills | Status: DC | PRN

## 2019-07-21 MED ORDER — NAPROXEN 500 MG PO TABS: 500.0000 mg | ORAL_TABLET | Freq: Two times a day (BID) | ORAL | 0 refills | Status: DC

## 2019-07-21 NOTE — Discharge Instructions (Signed)
Take acetaminophen as needed.

## 2019-07-21 NOTE — ED Notes (Signed)
Patient verbalizes understanding of discharge instructions. Opportunity for questioning and answers were provided. Armband removed by staff, pt discharged from ED. Ambulated out to lobby  

## 2019-07-21 NOTE — ED Provider Notes (Signed)
[facility] DEPART[facility] Provider Note   CSN: [id number] Arrival date & time: 07/21/19  0118    History   Chief Complaint Chief Complaint  Patient presents with  . Leg Pain    HPI Caleb Leblanc is a 58 y.o. male.   The history is provided by the patient.  Leg Pain He comes in with approximately 1 month history of numbness and swelling in his hands and about 2-week history of pain and swelling in his thighs and legs.  He rates his pain at 8/10.  Symptoms have been gradually getting worse.  He did go to an urgent care center where he was given a prescription of prednisone which did not help him at all.  He is also taken acetaminophen without relief.  He denies fever chills.  Denies rashes.  Denies chest pain or dyspnea.  He is a non-smoker and nondrinker and denies drug use.  History reviewed. No pertinent past medical history.  Patient Active Problem List   Diagnosis Date Noted  . OSA (obstructive sleep apnea) 02/21/2018  . Morbid obesity due to excess calories ([place]) 02/21/2018    History reviewed. No pertinent surgical history.      Home Medications    Prior to Admission medications   Medication Sig Start Date End Date Taking? Authorizing Provider  naproxen (NAPROSYN) 500 MG tablet Take 1 tablet (500 mg total) by mouth 2 (two) times daily. 70/35/00   Delora Fuel, MD  traMADol (ULTRAM) 50 MG tablet Take 1 tablet (50 mg total) by mouth every 6 (six) hours as needed. 93/81/82   Delora Fuel, MD    Family History No family history on file.  Social History Social History   Tobacco Use  . Smoking status: Former Smoker    Packs/day: 0.25    Types: Cigarettes    Quit date: 09/09/2017    Years since quitting: 1.8  . Smokeless tobacco: Never Used  . Tobacco comment: pt states he smoked 4 cigs/day before he quit  Substance Use Topics  . Alcohol use: Not Currently  . Drug use: Never     Allergies   Amoxicillin   Review of Systems  Review of Systems  All other systems reviewed and are negative.    Physical Exam Updated Vital Signs BP (!) 153/87 (BP Location: Left Arm)   Pulse (!) 105   Temp 98.7 F (37.1 C) (Oral)   Resp 20   Ht 5\' 8"  (1.727 m)   Wt (!) 150.6 kg   SpO2 95%   BMI 50.48 kg/m   Physical Exam Vitals signs and nursing note reviewed.    58 year old male, resting comfortably and in no acute distress. Vital signs are significant for elevated blood pressure and mildly elevated heart rate. Oxygen saturation is 95%, which is normal. Head is normocephalic and atraumatic. PERRLA, EOMI. Oropharynx is clear. Neck is nontender and supple without adenopathy or JVD. Back is nontender and there is no CVA tenderness. Lungs are clear without rales, wheezes, or rhonchi. Chest is nontender. Heart has regular rate and rhythm without murmur. Abdomen is soft, flat, nontender without masses or hepatosplenomegaly and peristalsis is normoactive. Extremities: Mild swelling is noted throughout his hands.  There is 1+ pretibial edema with mild venous stasis changes bilaterally.  There is mild tenderness throughout the legs from the inguinal area all the way down to his ankles.  No joint effusions are seen.  There is some questionable synovial thickening of the knees.  There is normal strength on pincer grasp.  No objective sensory deficits in spite of his complaint of numbness in his hands.  Normal strength of all muscles.  Dorsalis pedis pulses are strong.  Capillary refill is prompt. Skin is warm and dry without rash. Neurologic: Mental status is normal, cranial nerves are intact, there are no motor or sensory deficits.  ED Treatments / Results  Labs (all labs ordered are listed, but only abnormal results are displayed) Labs Reviewed  COMPREHENSIVE METABOLIC PANEL - Abnormal; Notable for the following components:      Result Value   Glucose, Bld 163 (*)    Albumin 3.4 (*)    All other components within normal  limits  CBC WITH DIFFERENTIAL/PLATELET   Procedures Procedures  Medications Ordered in ED Medications - No data to display   Initial Impression / Assessment and Plan / ED Course  I have reviewed the triage vital signs and the nursing notes.  Pertinent lab results that were available during my care of the patient were reviewed by me and considered in my medical decision making (see chart for details).  Leg pain and hand numbness of unclear cause.  This seems to be a rheumatologic issue.  Labs here show borderline elevated glucose.  I do not see any indication for x-rays today.  He needs thorough rheumatologic evaluation is referred to rheumatology for outpatient work-up.  In the meantime, will give a trial of NSAIDs.  He is given a prescription for naproxen and also given a prescription for small number of tramadol to use at night if he is having difficulty sleeping.  Old records are reviewed confirming recent urgent care visit with prescription for a 6-day prednisone taper.  Final Clinical Impressions(s) / ED Diagnoses   Final diagnoses:  Paresthesia of hand, bilateral  Pain in both lower extremities    ED Discharge Orders         Ordered    Ambulatory referral to Rheumatology     07/21/19 0410    naproxen (NAPROSYN) 500 MG tablet  2 times daily     07/21/19 0410    traMADol (ULTRAM) 50 MG tablet  Every 6 hours PRN     07/21/19 0410           Dione Booze, MD 07/21/19 0425

## 2019-07-21 NOTE — ED Triage Notes (Signed)
Pt c/o 9/10 bilateral thigh swollen and pain for the past 3 days and numbness on his hands. Pt denies any injury.

## 2019-07-29 ENCOUNTER — Encounter (HOSPITAL_COMMUNITY): Payer: Self-pay | Admitting: Emergency Medicine

## 2019-07-29 ENCOUNTER — Other Ambulatory Visit: Payer: Self-pay

## 2019-07-29 ENCOUNTER — Ambulatory Visit (HOSPITAL_COMMUNITY)
Admission: EM | Admit: 2019-07-29 | Discharge: 2019-07-29 | Disposition: A | Discharge status: Home / Self Care | Payer: 59 | Attending: Family Medicine | Admitting: Family Medicine

## 2019-07-29 DIAGNOSIS — M7989 Other specified soft tissue disorders: Secondary | ICD-10-CM | POA: Diagnosis not present

## 2019-07-29 DIAGNOSIS — M79641 Pain in right hand: Secondary | ICD-10-CM

## 2019-07-29 DIAGNOSIS — M79642 Pain in left hand: Secondary | ICD-10-CM

## 2019-07-29 LAB — TSH: TSH: 2.384 u[IU]/mL (ref 0.350–4.500)

## 2019-07-29 LAB — C-REACTIVE PROTEIN: CRP: <0.8 mg/dL (ref <1.0)

## 2019-07-29 MED ORDER — GABAPENTIN 300 MG PO CAPS: 300.0000 mg | ORAL_CAPSULE | Freq: Every day | ORAL | 0 refills | Status: DC

## 2019-07-29 NOTE — ED Triage Notes (Signed)
Patient reports being [facility] at [facility] for bilateral hand, thigh and feet swelling.  Reports naproxen made swelling worse.  Patient then was seen in ED and discharged home.  Reports things are not worse, but no better.    Patient was asleep in lobby chair

## 2019-07-29 NOTE — Discharge Instructions (Addendum)
I am doing additional blood work to check for rheumatoid disease/inflammatory disease Try taking gabapentin at bedtime.  This is for nerve pain Continue extra strength Tylenol for pain as needed See the specialist on Wednesday Follow-up with a primary care doctor for your regular medical needs

## 2019-07-29 NOTE — ED Provider Notes (Signed)
[facility]    CSN: [id number] Arrival date & time: 07/29/19  1404      History   Chief Complaint Chief Complaint  Patient presents with  . Edema    HPI Caleb Leblanc is a 58 y.o. male.   HPI  Patient has been seen in the emergency room and urgent care center 3 times in the last couple of weeks.  He was seen on 9/27, 10/11, 10/17, and again today for hand pain and swelling. He has had 2 evaluations including CBC and comprehensive metabolic panel. He has been given a course of steroids.  Naprosyn.  Tramadol.  Nothing is working. He is here today because he could not go to work today.  When he woke up this morning his hands were swollen and painful.  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  Even this evening he cannot make a fist due to hand swelling.  He states the pain is in the hand joint, but the swelling is diffuse.  He states he has numbness in all 5 fingers.  He states he has burning in both hands in a glove distribution with more discomfort palmar than dorsum.  He states that both feet have some swelling and burning pain as well. He has no medical history.  He is taking no prescription medications. Evaluation this week with orthopedics.  He has an evaluation in November with rheumatology.  History reviewed. No pertinent past medical history.  Patient Active Problem List   Diagnosis Date Noted  . OSA (obstructive sleep apnea) 02/21/2018  . Morbid obesity due to excess calories ([facility]) 02/21/2018    History reviewed. No pertinent surgical history.     Home Medications    Prior to Admission medications   Medication Sig Start Date End Date Taking? Authorizing Provider  gabapentin (NEURONTIN) 300 MG capsule Take 1 capsule (300 mg total) by mouth at bedtime. 07/29/19   Eustace MooreNelson, Tanicia Wolaver Sue, MD    Family History No family history on file.  Social History Social History   Tobacco Use  . Smoking  status: Former Smoker    Packs/day: 0.25    Types: Cigarettes    Quit date: 09/09/2017    Years since quitting: 1.8  . Smokeless tobacco: Never Used  . Tobacco comment: pt states he smoked 4 cigs/day before he quit  Substance Use Topics  . Alcohol use: Not Currently  . Drug use: Never     Allergies   Amoxicillin   Review of Systems Review of Systems  Constitutional: Negative for chills and fever.  HENT: Negative for ear pain and sore throat.   Eyes: Negative for pain and visual disturbance.  Respiratory: Negative for cough and shortness of breath.   Cardiovascular: Negative for chest pain and palpitations.  Gastrointestinal: Negative for abdominal pain and vomiting.  Genitourinary: Negative for dysuria and hematuria.  Musculoskeletal: Positive for arthralgias and joint swelling. Negative for back pain.  Skin: Negative for color change and rash.  Neurological: Positive for weakness and numbness. Negative for seizures and syncope.  All other systems reviewed and are negative.    Physical Exam Triage Vital Signs ED Triage Vitals  Enc Vitals Group     BP 07/29/19 1448 (!) 148/92     Pulse Rate 07/29/19 1448 87     Resp 07/29/19 1448 (!) 22     Temp 07/29/19 1448 98.6 F (37 C)     Temp  Source 07/29/19 1448 Oral     SpO2 07/29/19 1448 98 %     Weight --      Height --      Head Circumference --      Peak Flow --      Pain Score 07/29/19 1445 10     Pain Loc --      Pain Edu? --      Excl. in [place]? --    No data found.  Updated Vital Signs BP (!) 148/92 (BP Location: Left Arm) Comment (BP Location): large cuff  Pulse 87   Temp 98.6 F (37 C) (Oral)   Resp (!) 22   SpO2 98%       Physical Exam Constitutional:      General: He is not in acute distress.    Appearance: He is well-developed. He is obese.  HENT:     Head: Normocephalic and atraumatic.  Eyes:     Conjunctiva/sclera: Conjunctivae normal.     Pupils: Pupils are equal, round, and reactive to  light.     Comments: Question exophthalmos  Neck:     Musculoskeletal: Normal range of motion.  Cardiovascular:     Rate and Rhythm: Normal rate and regular rhythm.     Heart sounds: Normal heart sounds.  Pulmonary:     Effort: Pulmonary effort is normal. No respiratory distress.     Breath sounds: Normal breath sounds.  Abdominal:     General: There is no distension.     Palpations: Abdomen is soft.  Musculoskeletal: Normal range of motion.     Comments: Hands are diffusely swollen.  Patient can make a partial fist and extend fully.  There is no tenderness or synovitis of the joints.  Sensation is present in all 5 fingertips although he states it is diminished.  Phalen's and Tinel's are negative.  Skin:    General: Skin is warm and dry.  Neurological:     Mental Status: He is alert.      UC Treatments / Results  Labs (all labs ordered are listed, but only abnormal results are displayed) Labs Reviewed  C-REACTIVE PROTEIN  TSH  RHEUMATOID FACTOR    EKG   Radiology No results found.  Procedures Procedures (including critical care time)  Medications Ordered in UC Medications - No data to display  Initial Impression / Assessment and Plan / UC Course  I have reviewed the triage vital signs and the nursing notes.  Pertinent labs & imaging results that were available during my care of the patient were reviewed by me and considered in my medical decision making (see chart for details).     I do not know what is causing his hand swelling.  Has feet swelling by his history but this is not observed.  He only has trace swelling in the ankles.  He does not have any joint tenderness or synovitis.  I doubt he will get much more information from the orthopedist, possibly rheumatology can help him.  I am going to order rheumatoid factor and CRP just to get a couple more lab test that may be helpful for his condition.  Getting a TSH because of his prominent eyes although this may be  normal for him.  I told her we have reached the limit of what we can do for him at an urgent care or ER.  He needs to have a PCP. Final Clinical Impressions(s) / UC Diagnoses   Final diagnoses:  Bilateral hand  pain  Bilateral hand swelling     Discharge Instructions     I am doing additional blood work to check for rheumatoid disease/inflammatory disease Try taking gabapentin at bedtime.  This is for nerve pain Continue extra strength Tylenol for pain as needed See the specialist on Wednesday Follow-up with a primary care doctor for your regular medical needs   ED Prescriptions    Medication Sig Dispense Auth. Provider   gabapentin (NEURONTIN) 300 MG capsule Take 1 capsule (300 mg total) by mouth at bedtime. 30 capsule Eustace Moore, MD     PDMP not reviewed this encounter.   Eustace Moore, MD 07/29/19 2125

## 2019-07-31 LAB — RHEUMATOID FACTOR: Rhuematoid fact SerPl-aCnc: 12.6 IU/mL (ref 0.0–13.9)

## 2019-08-02 ENCOUNTER — Telehealth (HOSPITAL_COMMUNITY): Payer: Self-pay | Admitting: Emergency Medicine

## 2019-08-02 NOTE — Telephone Encounter (Signed)
All labs normal. Attempted to reach patient. No answer at this time.

## 2019-09-09 DIAGNOSIS — G5603 Carpal tunnel syndrome, bilateral upper limbs: Secondary | ICD-10-CM | POA: Insufficient documentation

## 2019-10-15 ENCOUNTER — Other Ambulatory Visit: Payer: Self-pay

## 2019-10-15 ENCOUNTER — Encounter (HOSPITAL_COMMUNITY): Payer: Self-pay | Admitting: Family Medicine

## 2019-10-15 ENCOUNTER — Emergency Department (HOSPITAL_COMMUNITY): Payer: No Typology Code available for payment source

## 2019-10-15 ENCOUNTER — Emergency Department (HOSPITAL_COMMUNITY)
Admission: EM | Admit: 2019-10-15 | Discharge: 2019-10-15 | Disposition: A | Discharge status: Home / Self Care | Payer: No Typology Code available for payment source | Attending: Emergency Medicine | Admitting: Emergency Medicine

## 2019-10-15 DIAGNOSIS — Y92410 Unspecified street and highway as the place of occurrence of the external cause: Secondary | ICD-10-CM | POA: Diagnosis not present

## 2019-10-15 DIAGNOSIS — Z87891 Personal history of nicotine dependence: Secondary | ICD-10-CM | POA: Diagnosis not present

## 2019-10-15 DIAGNOSIS — Y9389 Activity, other specified: Secondary | ICD-10-CM | POA: Diagnosis not present

## 2019-10-15 DIAGNOSIS — Y999 Unspecified external cause status: Secondary | ICD-10-CM | POA: Insufficient documentation

## 2019-10-15 DIAGNOSIS — M7918 Myalgia, other site: Secondary | ICD-10-CM

## 2019-10-15 DIAGNOSIS — Z79899 Other long term (current) drug therapy: Secondary | ICD-10-CM | POA: Diagnosis not present
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
## 2019-10-15 NOTE — ED Notes (Signed)
An After Visit Summary was printed and given to the patient. Discharge instructions given and no further questions at this time.  
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
## 2019-10-15 NOTE — Discharge Instructions (Addendum)
Tylenol or ibuprofen for pain/discomfort. Please refer to the attached instructions.

## 2020-08-14 ENCOUNTER — Emergency Department (HOSPITAL_COMMUNITY)
Admission: EM | Admit: 2020-08-14 | Discharge: 2020-08-14 | Disposition: A | Discharge status: Home / Self Care | Source: Home / Self Care | Attending: Emergency Medicine | Admitting: Emergency Medicine

## 2020-08-14 ENCOUNTER — Other Ambulatory Visit: Payer: Self-pay

## 2020-08-14 ENCOUNTER — Encounter (HOSPITAL_COMMUNITY): Payer: Self-pay

## 2020-08-14 ENCOUNTER — Emergency Department (HOSPITAL_COMMUNITY)

## 2020-08-14 DIAGNOSIS — E876 Hypokalemia: Secondary | ICD-10-CM

## 2020-08-14 DIAGNOSIS — G9341 Metabolic encephalopathy: Secondary | ICD-10-CM | POA: Diagnosis not present

## 2020-08-14 DIAGNOSIS — R4182 Altered mental status, unspecified: Secondary | ICD-10-CM | POA: Diagnosis not present

## 2020-08-14 DIAGNOSIS — I10 Essential (primary) hypertension: Secondary | ICD-10-CM | POA: Insufficient documentation

## 2020-08-14 DIAGNOSIS — Z79899 Other long term (current) drug therapy: Secondary | ICD-10-CM | POA: Insufficient documentation

## 2020-08-14 DIAGNOSIS — Z87891 Personal history of nicotine dependence: Secondary | ICD-10-CM | POA: Insufficient documentation

## 2020-08-14 HISTORY — DX: Essential (primary) hypertension: I10

## 2020-08-14 LAB — CBC WITH DIFFERENTIAL/PLATELET
Abs Immature Granulocytes: 0.04 10*3/uL (ref 0.00–0.07)
Basophils Absolute: 0 10*3/uL (ref 0.0–0.1)
Basophils Relative: 0 %
Eosinophils Absolute: 0 10*3/uL (ref 0.0–0.5)
Eosinophils Relative: 0 %
HCT: 44.8 % (ref 39.0–52.0)
Hemoglobin: 15.1 g/dL (ref 13.0–17.0)
Immature Granulocytes: 0 %
Lymphocytes Relative: 32 %
Lymphs Abs: 3.1 10*3/uL (ref 0.7–4.0)
MCH: 28.3 pg (ref 26.0–34.0)
MCHC: 33.7 g/dL (ref 30.0–36.0)
MCV: 83.9 fL (ref 80.0–100.0)
Monocytes Absolute: 0.9 10*3/uL (ref 0.1–1.0)
Monocytes Relative: 9 %
Neutro Abs: 5.6 10*3/uL (ref 1.7–7.7)
Neutrophils Relative %: 59 %
Platelets: 337 10*3/uL (ref 150–400)
RBC: 5.34 MIL/uL (ref 4.22–5.81)
RDW: 13.8 % (ref 11.5–15.5)
WBC: 9.6 10*3/uL (ref 4.0–10.5)
nRBC: 0 % (ref 0.0–0.2)

## 2020-08-14 LAB — RAPID URINE DRUG SCREEN, HOSP PERFORMED
Amphetamines: NOT DETECTED
Barbiturates: NOT DETECTED
Benzodiazepines: NOT DETECTED
Cocaine: NOT DETECTED
Opiates: NOT DETECTED
Tetrahydrocannabinol: NOT DETECTED

## 2020-08-14 LAB — URINALYSIS, ROUTINE W REFLEX MICROSCOPIC
Bilirubin Urine: NEGATIVE
Glucose, UA: NEGATIVE mg/dL
Hgb urine dipstick: NEGATIVE
Ketones, ur: NEGATIVE mg/dL
Leukocytes,Ua: NEGATIVE
Nitrite: NEGATIVE
Protein, ur: NEGATIVE mg/dL
Specific Gravity, Urine: 1.016 (ref 1.005–1.030)
pH: 5 (ref 5.0–8.0)

## 2020-08-14 LAB — COMPREHENSIVE METABOLIC PANEL
ALT: 21 U/L (ref 0–44)
AST: 21 U/L (ref 15–41)
Albumin: 4 g/dL (ref 3.5–5.0)
Alkaline Phosphatase: 43 U/L (ref 38–126)
Anion gap: 15 (ref 5–15)
BUN: 14 mg/dL (ref 6–20)
CO2: 26 mmol/L (ref 22–32)
Calcium: 8.8 mg/dL — ABNORMAL LOW (ref 8.9–10.3)
Chloride: 95 mmol/L — ABNORMAL LOW (ref 98–111)
Creatinine, Ser: 0.87 mg/dL (ref 0.61–1.24)
GFR, Estimated: >60 mL/min (ref >60)
Glucose, Bld: 114 mg/dL — ABNORMAL HIGH (ref 70–99)
Potassium: 2.6 mmol/L — CL (ref 3.5–5.1)
Sodium: 136 mmol/L (ref 135–145)
Total Bilirubin: 0.6 mg/dL (ref 0.3–1.2)
Total Protein: 8.6 g/dL — ABNORMAL HIGH (ref 6.5–8.1)

## 2020-08-14 LAB — TSH: TSH: 1.822 u[IU]/mL (ref 0.350–4.500)

## 2020-08-14 LAB — CBG MONITORING, ED: Glucose-Capillary: 108 mg/dL — ABNORMAL HIGH (ref 70–99)

## 2020-08-14 LAB — ETHANOL: Alcohol, Ethyl (B): <10 mg/dL (ref <10)

## 2020-08-14 LAB — MAGNESIUM: Magnesium: 2.4 mg/dL (ref 1.7–2.4)

## 2020-08-14 MED ORDER — POTASSIUM CHLORIDE 10 MEQ/100ML IV SOLN
10.0000 meq | Freq: Once | INTRAVENOUS | Status: AC
  Administered 2020-08-14: 10 meq via INTRAVENOUS
  Filled 2020-08-14: qty 100

## 2020-08-14 MED ORDER — POTASSIUM CHLORIDE CRYS ER 20 MEQ PO TBCR
40.0000 meq | EXTENDED_RELEASE_TABLET | Freq: Once | ORAL | Status: AC
  Administered 2020-08-14: 40 meq via ORAL
  Filled 2020-08-14: qty 2

## 2020-08-14 MED ORDER — POTASSIUM CHLORIDE CRYS ER 20 MEQ PO TBCR: 20.0000 meq | EXTENDED_RELEASE_TABLET | Freq: Two times a day (BID) | ORAL | 0 refills | Status: DC
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
## 2020-08-14 NOTE — ED Notes (Signed)
CRITICAL VALUE STICKER  CRITICAL VALUE: K 2.6  RECEIVER (on-site recipient of call): Jake T RN  DATE & TIME NOTIFIED: 08/14/20 727p  MESSENGER (representative from lab): Vernona Rieger  MD NOTIFIED: Rubin Payor, MD  TIME OF NOTIFICATION: 728p  RESPONSE: see orders

## 2020-08-14 NOTE — Discharge Instructions (Signed)
Your potassium was low.  This could have had you feeling off.  Your blood pressure was also elevated but did normalize partially while you are here.  Have been followed and medications adjusted as needed.

## 2020-08-14 NOTE — ED Provider Notes (Signed)
[facility]-EMERGENCY DEPT Provider Note   CSN: [id number] Arrival date & time: 08/14/20  1704     History Chief Complaint  Patient presents with  . Medical Clearance    Caleb Leblanc is a 59 y.o. male.  HPI Patient presents from jail.  Reportedly sent in for mental status changes.  Has had some confusion.  Has been going on for a few days.  States he is a little anxious but also has more trouble getting his thoughts together.  Some nausea.  Mild hypertension.  History of hypertension and is on medicines for it.  No trauma.  No confusion.  No headaches.  No chest pain.  Denies any substance abuse.  States has been getting his medications recently.  No lateralizing numbness or weakness.    Past Medical History:  Diagnosis Date  . Hypertension     Patient Active Problem List   Diagnosis Date Noted  . OSA (obstructive sleep apnea) 02/21/2018  . Morbid obesity due to excess calories (HCC) 02/21/2018    History reviewed. No pertinent surgical history.     History reviewed. No pertinent family history.  Social History   Tobacco Use  . Smoking status: Former Smoker    Packs/day: 0.25    Types: Cigarettes    Quit date: 09/09/2017    Years since quitting: 2.9  . Smokeless tobacco: Never Used  . Tobacco comment: pt states he smoked 4 cigs/day before he quit  Vaping Use  . Vaping Use: Never used  Substance Use Topics  . Alcohol use: Not Currently  . Drug use: Never    Home Medications Prior to Admission medications   Medication Sig Start Date End Date Taking? Authorizing Provider  amLODipine (NORVASC) 10 MG tablet Take 10 mg by mouth daily.   Yes [provider]  hydrochlorothiazide (HYDRODIURIL) 25 MG tablet Take 25 mg by mouth daily.   Yes [provider]  gabapentin (NEURONTIN) 300 MG capsule Take 1 capsule (300 mg total) by mouth at bedtime. Patient not taking: Reported on 08/14/2020 07/29/19   Eustace Moore, MD    potassium chloride SA (KLOR-CON) 20 MEQ tablet Take 1 tablet (20 mEq total) by mouth 2 (two) times daily. 08/14/20   Benjiman Core, MD    Allergies    Amoxicillin and Augmentin [amoxicillin-pot clavulanate]  Review of Systems   Review of Systems  Constitutional: Negative for appetite change and fever.  HENT: Negative for congestion.   Respiratory: Negative for shortness of breath.   Cardiovascular: Negative for chest pain.  Gastrointestinal: Positive for nausea. Negative for abdominal pain.  Genitourinary: Negative for flank pain.  Musculoskeletal: Negative for back pain.  Skin: Negative for rash.  Neurological: Negative for headaches.  Psychiatric/Behavioral: Positive for confusion.    Physical Exam Updated Vital Signs BP (!) 165/82 (BP Location: Left Arm)   Pulse 88   Temp 98.2 F (36.8 C) (Oral)   Resp 16   SpO2 95%   Physical Exam Vitals and nursing note reviewed.  HENT:     Head: Normocephalic and atraumatic.  Eyes:     Extraocular Movements: Extraocular movements intact.     Pupils: Pupils are equal, round, and reactive to light.  Cardiovascular:     Rate and Rhythm: Regular rhythm.  Pulmonary:     Effort: Pulmonary effort is normal.  Abdominal:     Tenderness: There is no abdominal tenderness.  Musculoskeletal:        General: No tenderness.  Cervical back: Neck supple.  Skin:    General: Skin is warm.     Capillary Refill: Capillary refill takes less than 2 seconds.  Neurological:     Mental Status: He is alert and oriented to person, place, and time.     Comments: Patient is in handcuffs and foot shackles.  However awake appropriate and able answer questions.  Eye movements intact.  Clear speech.  Psychiatric:        Mood and Affect: Mood normal.     ED Results / Procedures / Treatments   Labs (all labs ordered are listed, but only abnormal results are displayed) Labs Reviewed  COMPREHENSIVE METABOLIC PANEL - Abnormal; Notable for the  following components:      Result Value   Potassium 2.6 (*)    Chloride 95 (*)    Glucose, Bld 114 (*)    Calcium 8.8 (*)    Total Protein 8.6 (*)    All other components within normal limits  CBG MONITORING, ED - Abnormal; Notable for the following components:   Glucose-Capillary 108 (*)    All other components within normal limits  RAPID URINE DRUG SCREEN, HOSP PERFORMED  CBC WITH DIFFERENTIAL/PLATELET  ETHANOL  TSH  URINALYSIS, ROUTINE W REFLEX MICROSCOPIC  MAGNESIUM    EKG None  Radiology CT Head Wo Contrast  Result Date: 08/14/2020 CLINICAL DATA:  Mental status change EXAM: CT HEAD WITHOUT CONTRAST TECHNIQUE: Contiguous axial images were obtained from the base of the skull through the vertex without intravenous contrast. COMPARISON:  None. FINDINGS: Brain: No evidence of acute infarction, hemorrhage, hydrocephalus, extra-axial collection or mass lesion/mass effect. Vascular: Negative for hyperdense vessel Skull: Negative Sinuses/Orbits: Paranasal sinuses clear.  Negative orbit Other: None IMPRESSION: Negative CT head Electronically Signed   By: Marlan Palau M.D.   On: 08/14/2020 19:39    Procedures Procedures (including critical care time)  Medications Ordered in ED Medications  potassium chloride 10 mEq in 100 mL IVPB (0 mEq Intravenous Stopped 08/14/20 2120)  potassium chloride SA (KLOR-CON) CR tablet 40 mEq (40 mEq Oral Given 08/14/20 2015)    ED Course  I have reviewed the triage vital signs and the nursing notes.  Pertinent labs & imaging results that were available during my care of the patient were reviewed by me and considered in my medical decision making (see chart for details).    MDM Rules/Calculators/A&P                          Patient brought in for jail for generalized weakness.  Some confusion.  Work-up reassuring except for hypokalemia.  Supplement both IV and orally.  Will discharge with oral supplementation.  Otherwise labs reassuring.  Magnesium  reassuring.  Blood pressure mildly elevated but is on blood pressure medicines and that can be followed as an outpatient.  Will discharge home. Final Clinical Impression(s) / ED Diagnoses Final diagnoses:  Hypokalemia    Rx / DC Orders ED Discharge Orders         Ordered    potassium chloride SA (KLOR-CON) 20 MEQ tablet  2 times daily,   Status:  Discontinued        08/14/20 2233    potassium chloride SA (KLOR-CON) 20 MEQ tablet  2 times daily        08/14/20 2234           Benjiman Core, MD 08/15/20 1504

## 2020-08-15 ENCOUNTER — Other Ambulatory Visit: Payer: Self-pay

## 2020-08-15 ENCOUNTER — Encounter (HOSPITAL_COMMUNITY): Payer: Self-pay | Admitting: Emergency Medicine

## 2020-08-15 ENCOUNTER — Emergency Department (HOSPITAL_COMMUNITY)

## 2020-08-15 ENCOUNTER — Inpatient Hospital Stay (HOSPITAL_COMMUNITY)
Admission: EM | Admit: 2020-08-15 | Discharge: 2020-08-19 | DRG: 071 | Attending: Internal Medicine | Admitting: Internal Medicine

## 2020-08-15 DIAGNOSIS — G9341 Metabolic encephalopathy: Principal | ICD-10-CM | POA: Diagnosis present

## 2020-08-15 DIAGNOSIS — Z6841 Body Mass Index (BMI) 40.0 and over, adult: Secondary | ICD-10-CM

## 2020-08-15 DIAGNOSIS — Z87891 Personal history of nicotine dependence: Secondary | ICD-10-CM

## 2020-08-15 DIAGNOSIS — G9340 Encephalopathy, unspecified: Secondary | ICD-10-CM | POA: Diagnosis present

## 2020-08-15 DIAGNOSIS — E860 Dehydration: Secondary | ICD-10-CM | POA: Diagnosis present

## 2020-08-15 DIAGNOSIS — I10 Essential (primary) hypertension: Secondary | ICD-10-CM | POA: Diagnosis present

## 2020-08-15 DIAGNOSIS — Z1389 Encounter for screening for other disorder: Secondary | ICD-10-CM

## 2020-08-15 DIAGNOSIS — R509 Fever, unspecified: Secondary | ICD-10-CM

## 2020-08-15 DIAGNOSIS — R4182 Altered mental status, unspecified: Secondary | ICD-10-CM | POA: Diagnosis present

## 2020-08-15 DIAGNOSIS — N179 Acute kidney failure, unspecified: Secondary | ICD-10-CM

## 2020-08-15 DIAGNOSIS — Z79899 Other long term (current) drug therapy: Secondary | ICD-10-CM

## 2020-08-15 DIAGNOSIS — Z20822 Contact with and (suspected) exposure to covid-19: Secondary | ICD-10-CM | POA: Diagnosis present

## 2020-08-15 DIAGNOSIS — E876 Hypokalemia: Secondary | ICD-10-CM

## 2020-08-15 LAB — CBC WITH DIFFERENTIAL/PLATELET
Abs Immature Granulocytes: 0.03 10*3/uL (ref 0.00–0.07)
Basophils Absolute: 0 10*3/uL (ref 0.0–0.1)
Basophils Relative: 0 %
Eosinophils Absolute: 0 10*3/uL (ref 0.0–0.5)
Eosinophils Relative: 0 %
HCT: 43.6 % (ref 39.0–52.0)
Hemoglobin: 14 g/dL (ref 13.0–17.0)
Immature Granulocytes: 0 %
Lymphocytes Relative: 26 %
Lymphs Abs: 2.5 10*3/uL (ref 0.7–4.0)
MCH: 27.3 pg (ref 26.0–34.0)
MCHC: 32.1 g/dL (ref 30.0–36.0)
MCV: 85 fL (ref 80.0–100.0)
Monocytes Absolute: 1.1 10*3/uL — ABNORMAL HIGH (ref 0.1–1.0)
Monocytes Relative: 11 %
Neutro Abs: 5.9 10*3/uL (ref 1.7–7.7)
Neutrophils Relative %: 63 %
Platelets: 315 10*3/uL (ref 150–400)
RBC: 5.13 MIL/uL (ref 4.22–5.81)
RDW: 14 % (ref 11.5–15.5)
WBC: 9.5 10*3/uL (ref 4.0–10.5)
nRBC: 0 % (ref 0.0–0.2)

## 2020-08-15 LAB — MAGNESIUM: Magnesium: 2.4 mg/dL (ref 1.7–2.4)

## 2020-08-15 LAB — COMPREHENSIVE METABOLIC PANEL
ALT: 26 U/L (ref 0–44)
AST: 26 U/L (ref 15–41)
Albumin: 4.2 g/dL (ref 3.5–5.0)
Alkaline Phosphatase: 43 U/L (ref 38–126)
Anion gap: 13 (ref 5–15)
BUN: 26 mg/dL — ABNORMAL HIGH (ref 6–20)
CO2: 25 mmol/L (ref 22–32)
Calcium: 9.2 mg/dL (ref 8.9–10.3)
Chloride: 100 mmol/L (ref 98–111)
Creatinine, Ser: 1.23 mg/dL (ref 0.61–1.24)
GFR, Estimated: >60 mL/min (ref >60)
Glucose, Bld: 131 mg/dL — ABNORMAL HIGH (ref 70–99)
Potassium: 3.3 mmol/L — ABNORMAL LOW (ref 3.5–5.1)
Sodium: 138 mmol/L (ref 135–145)
Total Bilirubin: 1.2 mg/dL (ref 0.3–1.2)
Total Protein: 8.5 g/dL — ABNORMAL HIGH (ref 6.5–8.1)

## 2020-08-15 LAB — ETHANOL: Alcohol, Ethyl (B): <10 mg/dL (ref <10)

## 2020-08-15 LAB — TSH: TSH: 2.372 u[IU]/mL (ref 0.350–4.500)

## 2020-08-15 LAB — ACETAMINOPHEN LEVEL: Acetaminophen (Tylenol), Serum: <10 ug/mL — ABNORMAL LOW (ref 10–30)

## 2020-08-15 LAB — SALICYLATE LEVEL: Salicylate Lvl: <7 mg/dL — ABNORMAL LOW (ref 7.0–30.0)

## 2020-08-15 NOTE — ED Provider Notes (Cosign Needed)
[facility]-EMERGENCY DEPT Provider Note   CSN: [id number] Arrival date & time: 08/15/20  2118     History No chief complaint on file.   Caleb Leblanc is a 59 y.o. male presenting for altered mental status.  Level 5 caveat due to AMS.  Patient unable to provide any history other than he has pain all over.  Additional history obtained from [facility] office.  Patient is currently incarcerated.  They state he refused all meals today.  He was not acting right, and the nurse there was concern for possible stroke.  Also requesting a psych consult with patient is medically cleared.  Additional history obtained from chart review.  Patient was seen yesterday in the ED for altered mental status.  He had a negative work-up at that time, other than hypokalemia which was replenished in the ED and plan for p.o. replenishment at home.  HPI     Past Medical History:  Diagnosis Date  . Hypertension     Patient Active Problem List   Diagnosis Date Noted  . OSA (obstructive sleep apnea) 02/21/2018  . Morbid obesity due to excess calories (HCC) 02/21/2018    History reviewed. No pertinent surgical history.     No family history on file.  Social History   Tobacco Use  . Smoking status: Former Smoker    Packs/day: 0.25    Types: Cigarettes    Quit date: 09/09/2017    Years since quitting: 2.9  . Smokeless tobacco: Never Used  . Tobacco comment: pt states he smoked 4 cigs/day before he quit  Vaping Use  . Vaping Use: Never used  Substance Use Topics  . Alcohol use: Not Currently  . Drug use: Never    Home Medications Prior to Admission medications   Medication Sig Start Date End Date Taking? Authorizing Provider  amLODipine (NORVASC) 10 MG tablet Take 10 mg by mouth daily.    [provider]  gabapentin (NEURONTIN) 300 MG capsule Take 1 capsule (300 mg total) by mouth at bedtime. Patient not taking: Reported on 08/14/2020 07/29/19   Eustace Moore, MD  hydrochlorothiazide (HYDRODIURIL) 25 MG tablet Take 25 mg by mouth daily.    [provider]  potassium chloride SA (KLOR-CON) 20 MEQ tablet Take 1 tablet (20 mEq total) by mouth 2 (two) times daily. 08/14/20   Benjiman Core, MD    Allergies    Amoxicillin and Augmentin [amoxicillin-pot clavulanate]  Review of Systems   Review of Systems  Unable to perform ROS: Mental status change    Physical Exam Updated Vital Signs BP 135/79   Pulse (!) 107   Temp 99.6 F (37.6 C) (Axillary)   Resp 20   Ht 5\' 9"  (1.753 m)   SpO2 94%   BMI 49.03 kg/m   Physical Exam Vitals and nursing note reviewed.  Constitutional:      General: He is not in acute distress.    Appearance: He is well-developed.     Comments: Not responding verbally, however tracking movements in the room.  HENT:     Head: Normocephalic and atraumatic.     Comments: No signs of head trauma Eyes:     Extraocular Movements: Extraocular movements intact.     Conjunctiva/sclera: Conjunctivae normal.     Pupils: Pupils are equal, round, and reactive to light.     Comments: Will not follow finger for EOMs, but will look in all directions with verbal commands.  Mild proptosis.   Cardiovascular:  Rate and Rhythm: Regular rhythm. Tachycardia present.     Pulses: Normal pulses.     Comments: Mildly tachycardic Pulmonary:     Effort: Pulmonary effort is normal. No respiratory distress.     Breath sounds: Normal breath sounds. No wheezing.     Comments: Clear lung sounds Abdominal:     General: There is no distension.     Palpations: Abdomen is soft. There is no mass.     Tenderness: There is no abdominal tenderness. There is no guarding or rebound.     Comments: No TTP.  No rigidity, guarding, distention.  Musculoskeletal:     Cervical back: Normal range of motion and neck supple.     Comments: Shackled at wrists and ankles.  Patient not participating with exam with any movement or strength  requests.  Skin:    General: Skin is warm and dry.     Capillary Refill: Capillary refill takes less than 2 seconds.  Neurological:     GCS: GCS eye subscore is 4. GCS verbal subscore is 2. GCS motor subscore is 6.     Comments: Mostly not responding verbally.  Occasionally will have a one-word response which is usually not appropriate in the context of the question.  Tracking movements in the room and will obey commands such as eye movements, however not participating in any strength or movement examination.     ED Results / Procedures / Treatments   Labs (all labs ordered are listed, but only abnormal results are displayed) Labs Reviewed  CBC WITH DIFFERENTIAL/PLATELET - Abnormal; Notable for the following components:      Result Value   Monocytes Absolute 1.1 (*)    All other components within normal limits  COMPREHENSIVE METABOLIC PANEL - Abnormal; Notable for the following components:   Potassium 3.3 (*)    Glucose, Bld 131 (*)    BUN 26 (*)    Total Protein 8.5 (*)    All other components within normal limits  SALICYLATE LEVEL - Abnormal; Notable for the following components:   Salicylate Lvl <7.0 (*)    All other components within normal limits  ACETAMINOPHEN LEVEL - Abnormal; Notable for the following components:   Acetaminophen (Tylenol), Serum <10 (*)    All other components within normal limits  RESPIRATORY PANEL BY RT PCR (FLU A&B, COVID)  TSH  MAGNESIUM  ETHANOL  RAPID URINE DRUG SCREEN, HOSP PERFORMED  URINALYSIS, ROUTINE W REFLEX MICROSCOPIC    EKG None  Radiology CT Head Wo Contrast  Result Date: 08/15/2020 CLINICAL DATA:  Possible stroke EXAM: CT HEAD WITHOUT CONTRAST TECHNIQUE: Contiguous axial images were obtained from the base of the skull through the vertex without intravenous contrast. COMPARISON:  CT 08/14/2020 FINDINGS: Brain: No evidence of acute infarction, hemorrhage, hydrocephalus, extra-axial collection, visible mass lesion or mass effect.  Stable benign dural calcifications. Vascular: No hyperdense vessel or unexpected calcification. Skull: There is some questionable thickening and possible contusive change the left parietal scalp without subjacent calvarial fracture. No other acute osseous or soft tissue abnormality of skull scalp tissues. Sinuses/Orbits: Paranasal sinuses and mastoid air cells are predominantly clear. Included orbital structures are unremarkable. Other: None. IMPRESSION: 1. No acute intracranial abnormality. 2. Thickening, possibly contusive change, of the left parietal scalp without subjacent calvarial fracture. Correlate for point tenderness. Electronically Signed   By: Kreg Shropshire M.D.   On: 08/15/2020 23:07   CT Head Wo Contrast  Result Date: 08/14/2020 CLINICAL DATA:  Mental status change EXAM: CT HEAD  WITHOUT CONTRAST TECHNIQUE: Contiguous axial images were obtained from the base of the skull through the vertex without intravenous contrast. COMPARISON:  None. FINDINGS: Brain: No evidence of acute infarction, hemorrhage, hydrocephalus, extra-axial collection or mass lesion/mass effect. Vascular: Negative for hyperdense vessel Skull: Negative Sinuses/Orbits: Paranasal sinuses clear.  Negative orbit Other: None IMPRESSION: Negative CT head Electronically Signed   By: Marlan Palau M.D.   On: 08/14/2020 19:39    Procedures Procedures (including critical care time)  Medications Ordered in ED Medications - No data to display  ED Course  I have reviewed the triage vital signs and the nursing notes.  Pertinent labs & imaging results that were available during my care of the patient were reviewed by me and considered in my medical decision making (see chart for details).    MDM Rules/Calculators/A&P                          Pt presenting for evaluation of AMS. On exam, pt is altered, and with limited participation in the exam. Hard to ascertain if this is inability to participate vs unwillingness.  Per chart  review yesterday, patient had a Foley oriented verbal response, this does appear acutely different today.  As such, we will repeat labs and CT imaging. Concern that pt's K may be low again.   Labs interpreted by me, overall reassuring.  Potassium much improved from yesterday.  Electrolytes stable.  CT head negative for acute findings.  As patient is without focal deficit, low suspicion for stroke, I do not believe he needs emergent MRI.  At this time, patient is medically cleared.  Will consult TTS.  The patient has been placed in psychiatric observation due to the need to provide a safe environment for the patient while obtaining psychiatric consultation and evaluation, as well as ongoing medical and medication management to treat the patient's condition.  The patient has not been placed under full IVC at this time.  Final Clinical Impression(s) / ED Diagnoses Final diagnoses:  Altered mental status, unspecified altered mental status type    Rx / DC Orders ED Discharge Orders    None       Alveria Apley, PA-C 08/15/20 2347

## 2020-08-15 NOTE — ED Triage Notes (Signed)
Sent from jail for possible stoke. Pt was seen here yesterday for same. Minimal information given by sheriffs that have accompanied the pt here.

## 2020-08-15 NOTE — ED Notes (Signed)
Sheriff is now requesting psych consult for the pt.

## 2020-08-16 ENCOUNTER — Emergency Department (HOSPITAL_COMMUNITY)

## 2020-08-16 ENCOUNTER — Encounter (HOSPITAL_COMMUNITY): Payer: Self-pay | Admitting: Adult Health

## 2020-08-16 DIAGNOSIS — N179 Acute kidney failure, unspecified: Secondary | ICD-10-CM

## 2020-08-16 DIAGNOSIS — E876 Hypokalemia: Secondary | ICD-10-CM

## 2020-08-16 DIAGNOSIS — R4182 Altered mental status, unspecified: Secondary | ICD-10-CM | POA: Diagnosis present

## 2020-08-16 LAB — RESPIRATORY PANEL BY RT PCR (FLU A&B, COVID)
Influenza A by PCR: NEGATIVE
Influenza B by PCR: NEGATIVE
SARS Coronavirus 2 by RT PCR: NEGATIVE

## 2020-08-16 LAB — LACTIC ACID, PLASMA
Lactic Acid, Venous: 2.1 mmol/L (ref 0.5–1.9)
Lactic Acid, Venous: 1.4 mmol/L (ref 0.5–1.9)

## 2020-08-16 MED ORDER — ACETAMINOPHEN 650 MG RE SUPP: 650.0000 mg | Freq: Four times a day (QID) | RECTAL | Status: DC | PRN

## 2020-08-16 MED ORDER — SODIUM CHLORIDE 0.9% FLUSH
3.0000 mL | Freq: Two times a day (BID) | INTRAVENOUS | Status: DC
  Administered 2020-08-16 – 2020-08-18 (×3): 3 mL via INTRAVENOUS

## 2020-08-16 MED ORDER — LORAZEPAM 2 MG/ML IJ SOLN
1.0000 mg | Freq: Once | INTRAMUSCULAR | Status: AC | PRN
  Administered 2020-08-16: 1 mg via INTRAVENOUS
  Filled 2020-08-16: qty 1

## 2020-08-16 MED ORDER — AMLODIPINE BESYLATE 10 MG PO TABS
10.0000 mg | ORAL_TABLET | Freq: Every day | ORAL | Status: DC
  Administered 2020-08-19: 10 mg via ORAL
  Filled 2020-08-16: qty 1

## 2020-08-16 MED ORDER — SODIUM CHLORIDE 0.9 % IV BOLUS
1000.0000 mL | Freq: Once | INTRAVENOUS | Status: AC
  Administered 2020-08-16: 1000 mL via INTRAVENOUS

## 2020-08-16 MED ORDER — ACETAMINOPHEN 325 MG PO TABS
650.0000 mg | ORAL_TABLET | Freq: Four times a day (QID) | ORAL | Status: DC | PRN
  Administered 2020-08-19: 650 mg via ORAL
  Filled 2020-08-16: qty 2

## 2020-08-16 MED ORDER — SODIUM CHLORIDE 0.9 % IV SOLN: INTRAVENOUS | Status: DC

## 2020-08-16 MED ORDER — HEPARIN SODIUM (PORCINE) 5000 UNIT/ML IJ SOLN
5000.0000 [IU] | Freq: Three times a day (TID) | INTRAMUSCULAR | Status: DC
  Administered 2020-08-16 – 2020-08-19 (×8): 5000 [IU] via SUBCUTANEOUS
  Filled 2020-08-16 (×8): qty 1

## 2020-08-16 NOTE — Progress Notes (Signed)
Spoke with radiologist (Dr. Chestine Spore), patient is approved for MRI.

## 2020-08-16 NOTE — Assessment & Plan Note (Addendum)
-  Unclear etiology.  Medical etiologies seem to have been ruled out at this point with no obvious lab derangements but will obtain new labs in a.m.; MRI brain also rather unremarkable, slightly motion degraded but patient has also had 2 CT head scans which were also unremarkable; he has also been evaluated by psych however may still warrant some involvement with psychiatry if ongoing refusal to cooperate/participate -Given his incarcerated status, malingering is also a possibility vs factitious (patient refusing to eat and kidney function does show some signs of rising BUN/creat) - monitor further overnight - other considered differentials at this time yet to be ruled out would be CNS involvement (encephalitis/meningitis) vs non-convulsive epilepsy however patient refusal of exams is voluntary and he is intermittently cooperative while obviously refusing at other times. If still having suspicion for AMS in am, may need to involve neuro to weigh in on need for LP (as suspicion too low on admission to warrant test, especially with patient refusing care) -Follow-up pending blood cultures as well -Check thiamine level

## 2020-08-16 NOTE — ED Notes (Signed)
Called MRI 2x without answer.

## 2020-08-16 NOTE — ED Notes (Signed)
TTS eval in process

## 2020-08-16 NOTE — ED Notes (Signed)
Pt irritated when attempting to re-apply spo2 monitor onto pt finger. Pt refusing to open his eyes and agitated when staff attempted to re-assess. No urine spec collected @ this time.

## 2020-08-16 NOTE — ED Provider Notes (Signed)
Patient care was taken over from Dr. Bernette Mayers.  In short, patient is a 59 year old male who presents with confusion.  Is unclear the etiology of this.  There was some concern for stroke and an MRI was performed which shows no acute abnormality.  Got some Ativan for the MRI and was sedated after that.  Once he was awake, I reassessed him.  He will answer questions at times.  He is not fully oriented.  He will tell me his name and that he is in the hospital but when I ask him what year it is he says is the 75s.  He then starts talking about something on the TV.  It is unclear whether this is some sort of encephalopathy versus a psychiatric disorder.  I attempted to have psychiatry reevaluate the patient as they had seen him last night and cleared him.  TTS try to evaluate him and said that they would not be able to evaluate him until he is willing to talk to them.  At this point he is noncommunicative with them.  He does not have a fever or other meningeal symptoms.  His labs are grossly nonconcerning.  He initially had a minimally elevated lactate but this normalized.  He has a mild bump in his creatinine and was given IV fluids.  His other vital signs are stable.  Given his ongoing symptoms with unclear etiology, I did speak with the hospitalist who has agreed to admit him for observation.   Caleb Bucco, MD 08/16/20 [phone]

## 2020-08-16 NOTE — Progress Notes (Signed)
Delay in patient care. Patient unable to provide medical history (safely clear patient for MRI). Spoke with RN.

## 2020-08-16 NOTE — ED Notes (Signed)
Spoke with TTS [phone] for re-eval, notified MD. Pt awaiting re-eval, is resting, no s/s distress noted

## 2020-08-16 NOTE — ED Notes (Signed)
Attempted stroke swallow screen prior to giving breakfast tray. Pt gave one incomprehensible response. Did not attempt screen, meal not given.

## 2020-08-16 NOTE — Assessment & Plan Note (Signed)
-  Previously treated from recent ER visit -Repeat CMP in a.m. and replete as indicated -Magnesium also ordered

## 2020-08-16 NOTE — ED Notes (Signed)
TTS at bedside. 

## 2020-08-16 NOTE — ED Notes (Signed)
Date and time results received: 08/16/20 2:46 AM  (use smartphrase ".now" to insert current time)  Test: Lactic Critical Value: 2.1  Name of Provider Notified: Lynelle Doctor  Orders Received? Or Actions Taken?: Orders Received - See Orders for details

## 2020-08-16 NOTE — Assessment & Plan Note (Signed)
-   patient reported to be refusing to eat at jail since altered behavior on 11/5; renal function shows signs of dehydration/volume depletion - start on IVF - repeat labs in am - see AMS given this may be self-induced

## 2020-08-16 NOTE — BH Assessment (Addendum)
Made contact w/ pt's nurse via the Tele-Assessment machine in an effort to complete pt's re-assessment at the request of his EDP. Introduced self to pt and informed him of my purpose, which is to re-assess him to determine how we might best meet his needs. Request he inform me why he is in the hospital; pt did not respond. Asked pt if he knew how many days he has been in the hospital; pt did not respond. Asked pt if he was able to hear me; the police officer sitting in pt's room stated that they were able to hear be but that pt was choosing not to participate in the assessment. Inquired as to whether pt had been verbal/interacting prior to me calling in and the police officer stated he had told the nurses when the vitals/tests they were doing were upsetting him. Clinician thanked pt's police officer for the information and requested he inform pt's nurses if pt expressed an interest in speaking to clinician in the future; pt's police officer agreed to do so.  Elenore Paddy, NP, reviewed pt's chart and information and determined pt does not meet inpatient criteria and can be d/c back to jail once he is medically cleared. This information was provided to pt's EDP, Rolan Bucco, at 2137.

## 2020-08-16 NOTE — ED Notes (Signed)
Per Bernette Mayers MD, request sent to MRI tech on call. MD and RN advised. [employer]

## 2020-08-16 NOTE — Hospital Course (Signed)
Caleb Leblanc is a 59 yo AA male with PMH HTN who was brought to the ER via police escort from his detention center for a change in mental status. He first was brought to the ER on 11/5 for AMS and underwent workup.   His only obvious abnormality on workup on 11/5 was hypokalemia (2.6) but otherwise workup was unremarkable.  CT head showed no acute findings as well.  He received potassium supplementation and was discharged back to jail.  He was again sent back to the ER the evening of 08/15/2020 due to ongoing altered mental status and refusing to eat meals at the jail.  He was again brought back to the ER for further work-up.  Request for psych evaluation by the jail had been made as well. He was evaluated at 1 AM on 08/16/2020 and was not considered to meet inpatient psych criteria and was cleared for discharge back to the jail when medically stable.  He continued to be observed in the ER and was noted to be still confused but also refusing to engage in examinations and questions at times. He then underwent repeat CT head on 08/15/2020 which showed no acute abnormalities (possibly a contusive change in the left parietal scalp?).  An MRI brain was then obtained which showed no acute infarct and mild white matter changes consistent with chronic microvascular ischemia.  Study was slightly motion degraded.  After MRI was complete and no obvious medical abnormalities have been found, psychiatry was requested to repeat assessment.  This was performed around 8:45 PM on 08/16/2020, however patient refused to partake in the exam and interview.  He was nonverbal and not answering questions.  There was no report of any seizure activity at the jail prior to his presentations and he has no prior history of seizures.  Initial screens of Tylenol level, salicylate, and ethanol were negative as well as UDS.  He had no true fever.  T-max was 99.6 on assessment.  There was no obvious concern for neck stiffness/pain/back  pain. During different ER provider evaluations he was cooperative at times but only partially with exams and other times uncooperative. Due to concern for underlying ongoing AMS, decision was made to monitor patient further overnight.

## 2020-08-16 NOTE — BH Assessment (Signed)
Tele Assessment Note   Patient Name: Caleb Leblanc MRN: [id number] Referring Physician: Alveria Apley, PA-C Location of Patient: [facility], [phone] Location of Provider: Behavioral Health TTS Department  Caleb Leblanc is an 59 y.o. male who presents to [facility] ED from jail accompanied by law enforcement officers due to altered mental status. Law Engineer, manufacturing systems states Pt appeared confused today, refused all meals, and the RN at the jail was concerned Pt had a stroke. Pt has difficulty answering questions and speaks in a low, mumbled voice. He did not give his correct date of birth. Pt knows the year and location. He does not respond when asked why he was brought to the ED. He does not respond when asked if he is feeling depressed. Pt denies current suicidal ideation or current homicidal ideation. He does not respond when asked if he is experiencing auditory or visual hallucinations. He does not respond when asked if he has a history of using substances. Pt's medical record does not indicate any history of mental health treatment. Pt reports he has seen a psychiatrist in the past but does not respond when asked why he was receiving mental health treatment. No further information was able to be obtained.  Diagnosis: Deferred  Past Medical History:  Past Medical History:  Diagnosis Date  . Hypertension     History reviewed. No pertinent surgical history.  Family History: No family history on file.  Social History:  reports that he quit smoking about 2 years ago. His smoking use included cigarettes. He smoked 0.25 packs per day. He has never used smokeless tobacco. He reports previous alcohol use. He reports that he does not use drugs.  Additional Social History:  Alcohol / Drug Use Pain Medications: Unknown Prescriptions: Unknown Over the Counter: Unknown History of alcohol / drug use?: No history of alcohol / drug abuse Longest period of sobriety (when/how long):  NA  CIWA: CIWA-Ar BP: (!) 150/95 Pulse Rate: (!) 104 COWS:    Allergies:  Allergies  Allergen Reactions  . Amoxicillin Anaphylaxis  . Augmentin [Amoxicillin-Pot Clavulanate] Anaphylaxis    Home Medications: (Not in a hospital admission)   OB/GYN Status:  No LMP for male patient.  General Assessment Data Location of Assessment: [facility] TTS Assessment: In system Is this a Tele or Face-to-Face Assessment?: Tele Assessment Is this an Initial Assessment or a Re-assessment for this encounter?: Initial Assessment Patient Accompanied by:: Other Mudlogger) Language Other than English: No Living Arrangements: Other (Comment) (Incarcerated) What gender do you identify as?: Male Date Telepsych consult ordered in [facility]: 08/15/20 Time Telepsych consult ordered in [facility]: 2347 Marital status: Other (comment) (Unknown) Maiden name: NA Pregnancy Status: No Living Arrangements: Other (Comment) (unknown) Can pt return to current living arrangement?: Yes Admission Status: Other (Comment) (In law enforcement custody) Is patient capable of signing voluntary admission?: Yes Referral Source: Other Mudlogger) Insurance type: unknown     Crisis Care Plan Living Arrangements: Other (Comment) (unknown) Legal Guardian: Other: (Self) Name of Psychiatrist: None Name of Therapist: None  Education Status Is patient currently in school?: No Is the patient employed, unemployed or receiving disability?:  (Unknown)  Risk to self with the past 6 months Suicidal Ideation: No Has patient been a risk to self within the past 6 months prior to admission? : No Suicidal Intent: No Has patient had any suicidal intent within the past 6 months prior to admission? : No Is patient at risk for suicide?: No Suicidal Plan?: No  Has patient had any suicidal plan within the past 6 months prior to admission? : No Access to Means: No What has been your use of drugs/alcohol within the last 12 months?:  unknown Previous Attempts/Gestures:  (unknown) How many times?: 0 Other Self Harm Risks: None identified Triggers for Past Attempts: Unknown Intentional Self Injurious Behavior: None Family Suicide History: Unable to assess Recent stressful life event(s): Legal Issues Persecutory voices/beliefs?:  (Unable to assess due to mental status) Depression: No Substance abuse history and/or treatment for substance abuse?:  (Unable to assess due to mental status) Suicide prevention information given to non-admitted patients: Not applicable  Risk to Others within the past 6 months Homicidal Ideation: No Does patient have any lifetime risk of violence toward others beyond the six months prior to admission? : Unknown Thoughts of Harm to Others: No Current Homicidal Intent: No Current Homicidal Plan: No Access to Homicidal Means: No Identified Victim: None History of harm to others?:  (Unable to assess due to mental status) Assessment of Violence:  (Unable to assess due to mental status) Violent Behavior Description: Unable to assess due to mental status Does patient have access to weapons?: No Criminal Charges Pending?: Yes Describe Pending Criminal Charges: unknown Does patient have a court date: Yes Court Date:  (Unknown) Is patient on probation?: No  Psychosis Hallucinations:  (Unable to assess due to mental status) Delusions:  (Unable to assess due to mental status)  Mental Status Report Appearance/Hygiene: Other (Comment) (In orange jump suit) Eye Contact: Good Motor Activity: Unable to assess Speech: Soft, Slurred Level of Consciousness: Alert Mood: Euthymic Affect: Blunted Anxiety Level:  (Unable to assess due to mental status) Thought Processes: Unable to Assess Judgement: Impaired Orientation: Place Obsessive Compulsive Thoughts/Behaviors: None  Cognitive Functioning Concentration: Poor Memory: Recent Impaired, Remote Impaired Is patient IDD: No Insight: Poor Impulse  Control: Unable to Assess Appetite:  (Unable to assess due to mental status) Have you had any weight changes? :  (Unable to assess due to mental status) Sleep: Unable to Assess Total Hours of Sleep:  (Unable to assess due to mental status) Vegetative Symptoms: Unable to Assess  ADLScreening [facility] Assessment Services) Patient's cognitive ability adequate to safely complete daily activities?:  (Unable to assess due to mental status) Patient able to express need for assistance with ADLs?:  (Unable to assess due to mental status) Independently performs ADLs?:  (Unable to assess due to mental status)  Prior Inpatient Therapy Prior Inpatient Therapy:  (Unable to assess due to mental status)  Prior Outpatient Therapy Prior Outpatient Therapy:  (Unable to assess due to mental status)  ADL Screening (condition at time of admission) Patient's cognitive ability adequate to safely complete daily activities?:  (Unable to assess due to mental status) Patient able to express need for assistance with ADLs?:  (Unable to assess due to mental status) Independently performs ADLs?:  (Unable to assess due to mental status)       Abuse/Neglect Assessment (Assessment to be complete while patient is alone) Abuse/Neglect Assessment Can Be Completed: Unable to assess, patient is non-responsive or altered mental status     Advance Directives (For Healthcare) Does Patient Have a Medical Advance Directive?: Unable to assess, patient is non-responsive or altered mental status          Disposition: Gave clinical report to Otila Back, NP who said Pt does not meet criteria for inpatient psychiatric treatment. He has no recommendations for psychiatric medications. Due to Pt's current incarceration, Pt's only options for mental  health treatment [facility] jail [facility] provider. Notified Dr. Devoria Albe and Alyson Reedy, RN of recommendation.  Disposition Initial Assessment Completed for this  Encounter: Yes  This service was provided via telemedicine using a 2-way, interactive audio and video technology.  Names of all persons participating in this telemedicine service and their role in this encounter. Name: Gilberto Better Role: Patient  Name: Shela Commons, [facility] Role: TTS counselor         Harlin Rain Patsy [place], [facility], [facility] Triage Specialist [phone]  Pamalee Leyden 08/16/2020 1:02 AM

## 2020-08-16 NOTE — H&P (Signed)
History and Physical    Caleb Leblanc  WER:[id number]  DOB: 29-Nov-1960  DOA: 08/15/2020  PCP: Patient, No Pcp Per Patient coming from: jail  Chief Complaint: Reported to have AMS, refusing to eat  HPI:  Caleb Leblanc is a 59 yo AA male with PMH HTN who was brought to the ER via police escort from his detention center for a change in mental status. He first was brought to the ER on 11/5 for AMS and underwent workup.   His only obvious abnormality on workup on 11/5 was hypokalemia (2.6) but otherwise workup was unremarkable.  CT head showed no acute findings as well.  He received potassium supplementation and was discharged back to jail.  He was again sent back to the ER the evening of 08/15/2020 due to ongoing altered mental status and refusing to eat meals at the jail.  He was again brought back to the ER for further work-up.  Request for psych evaluation by the jail had been made as well. He was evaluated at 1 AM on 08/16/2020 and was not considered to meet inpatient psych criteria and was cleared for discharge back to the jail when medically stable.  He continued to be observed in the ER and was noted to be still confused but also refusing to engage in examinations and questions at times. He then underwent repeat CT head on 08/15/2020 which showed no acute abnormalities (possibly a contusive change in the left parietal scalp?).  An MRI brain was then obtained which showed no acute infarct and mild white matter changes consistent with chronic microvascular ischemia.  Study was slightly motion degraded.  After MRI was complete and no obvious medical abnormalities have been found, psychiatry was requested to repeat assessment.  This was performed around 8:45 PM on 08/16/2020, however patient refused to partake in the exam and interview.  He was nonverbal and not answering questions.  There was no report of any seizure activity at the jail prior to his presentations and he has no prior history of  seizures.  Initial screens of Tylenol level, salicylate, and ethanol were negative as well as UDS.  He had no true fever.  T-max was 99.6 on assessment.  There was no obvious concern for neck stiffness/pain/back pain. During different ER provider evaluations he was cooperative at times but only partially with exams and other times uncooperative. Due to concern for underlying ongoing AMS, decision was made to monitor patient further overnight.   I have personally briefly reviewed patient's old medical records in [facility] and discussed patient with the ER provider when appropriate/indicated.  Assessment/Plan: * AMS (altered mental status) -Unclear etiology.  Medical etiologies seem to have been ruled out at this point with no obvious lab derangements but will obtain new labs in a.m.; MRI brain also rather unremarkable, slightly motion degraded but patient has also had 2 CT head scans which were also unremarkable; he has also been evaluated by psych however may still warrant some involvement with psychiatry if ongoing refusal to cooperate/participate -Given his incarcerated status, malingering is also a possibility vs factitious (patient refusing to eat and kidney function does show some signs of rising BUN/creat) - monitor further overnight - other considered differentials at this time yet to be ruled out would be CNS involvement (encephalitis/meningitis) vs non-convulsive epilepsy however patient refusal of exams is voluntary and he is intermittently cooperative while obviously refusing at other times. If still having suspicion for AMS in am, may need to involve neuro  to weigh in on need for LP (as suspicion too low on admission to warrant test, especially with patient refusing care) -Follow-up pending blood cultures as well -Check thiamine level  AKI (acute kidney injury) (HCC) - patient reported to be refusing to eat at jail since altered behavior on 11/5; renal function shows signs of  dehydration/volume depletion - start on IVF - repeat labs in am - see AMS given this may be self-induced  Hypokalemia -Previously treated from recent ER visit -Repeat CMP in a.m. and replete as indicated -Magnesium also ordered    Code Status: Full DVT Prophylaxis: HSQ Anticipated disposition is to: Jail  History: Past Medical History:  Diagnosis Date  . Hypertension     History reviewed. No pertinent surgical history.   reports that he quit smoking about 2 years ago. His smoking use included cigarettes. He smoked 0.25 packs per day. He has never used smokeless tobacco. He reports previous alcohol use. He reports that he does not use drugs.  Allergies  Allergen Reactions  . Amoxicillin Anaphylaxis  . Augmentin [Amoxicillin-Pot Clavulanate] Anaphylaxis    History reviewed. No pertinent family history.  Home Medications: Prior to Admission medications   Medication Sig Start Date End Date Taking? Authorizing Provider  amLODipine (NORVASC) 10 MG tablet Take 10 mg by mouth daily.   Yes [provider]  hydrochlorothiazide (HYDRODIURIL) 25 MG tablet Take 25 mg by mouth daily.   Yes [provider]  potassium chloride SA (KLOR-CON) 20 MEQ tablet Take 1 tablet (20 mEq total) by mouth 2 (two) times daily. 08/14/20   Benjiman Core, MD    Review of Systems:  Review of systems not obtained due to patient factors. Patient refused to interact or talk  Physical Exam: Vitals:   08/16/20 2111 08/16/20 2122 08/16/20 2200 08/16/20 2202  BP:  130/78 127/83   Pulse: 83 83 81   Resp:  18 17   Temp:      TempSrc:      SpO2: 96% 98%  92%  Height:       General appearance: Patient refused to talk to me or interact with physical exam but was noted to be resting in bed and purposefully resisting my attempts to examine him.  Officers bedside and patient in no distress.  Shackles noted in all 4 extremities Head: Normocephalic, without obvious abnormality,  atraumatic Eyes: Briefly able to open patient's eyes and there was no obvious nystagmus and patient continued to try and close eyes but ocular movements were symmetric and normal Chest wall: Equal chest rise and no obvious abnormalities Abdomen: Appeared obese but not able to examine beyond this Extremities: No edema Skin: Warm, dry, intact Neurologic: Unable to fully assess but moving all 4 extremities.  Resisted my attempts to move his upper extremities as he was flexing upper extremities bilaterally and trying to push my hands away with his feet when examining lower extremities  Labs on Admission:  I have personally reviewed following labs and imaging studies Results for orders placed or performed during the hospital encounter of 08/15/20 (from the past 24 hour(s))  Culture, blood (routine x 2)     Status: None (Preliminary result)   Collection Time: 08/16/20  1:13 AM   Specimen: BLOOD  Result Value Ref Range   Specimen Description      BLOOD RIGHT ANTECUBITAL Performed at [facility], [address], [place], [place] [address]    Special Requests      BOTTLES DRAWN AEROBIC  AND ANAEROBIC Blood Culture adequate volume Performed at [facility], [address] [place], [place] [address]    Culture      NO GROWTH < 12 HOURS Performed at [facility], [address], [place], [place] [address]    Report Status PENDING   Lactic acid, plasma     Status: Abnormal   Collection Time: 08/16/20  1:44 AM  Result Value Ref Range   Lactic Acid, Venous 2.1 (HH) 0.5 - 1.9 mmol/L  Lactic acid, plasma     Status: None   Collection Time: 08/16/20  3:28 AM  Result Value Ref Range   Lactic Acid, Venous 1.4 0.5 - 1.9 mmol/L     Radiological Exams on Admission: CT Head Wo Contrast  Result Date: 08/15/2020 CLINICAL DATA:  Possible stroke EXAM: CT HEAD WITHOUT CONTRAST TECHNIQUE: Contiguous axial images were obtained from the base of the skull through  the vertex without intravenous contrast. COMPARISON:  CT 08/14/2020 FINDINGS: Brain: No evidence of acute infarction, hemorrhage, hydrocephalus, extra-axial collection, visible mass lesion or mass effect. Stable benign dural calcifications. Vascular: No hyperdense vessel or unexpected calcification. Skull: There is some questionable thickening and possible contusive change the left parietal scalp without subjacent calvarial fracture. No other acute osseous or soft tissue abnormality of skull scalp tissues. Sinuses/Orbits: Paranasal sinuses and mastoid air cells are predominantly clear. Included orbital structures are unremarkable. Other: None. IMPRESSION: 1. No acute intracranial abnormality. 2. Thickening, possibly contusive change, of the left parietal scalp without subjacent calvarial fracture. Correlate for point tenderness. Electronically Signed   By: Kreg Shropshire M.D.   On: 08/15/2020 23:07   MR BRAIN WO CONTRAST  Result Date: 08/16/2020 CLINICAL DATA:  Mental status change EXAM: MRI HEAD WITHOUT CONTRAST TECHNIQUE: Multiplanar, multiecho pulse sequences of the brain and surrounding structures were obtained without intravenous contrast. COMPARISON:  CT head 08/15/2020 FINDINGS: Brain: Limited study. Images degraded by motion. Patient not able to complete all sequences. Negative for acute infarct. Mild white matter changes consistent with chronic microvascular ischemia. Negative for hemorrhage or mass. Ventricle size normal. Vascular: Normal arterial flow voids. Skull and upper cervical spine: Negative Sinuses/Orbits: Negative Other: None IMPRESSION: Limited study, degraded by motion and incomplete. Negative for acute infarct. Mild chronic microvascular ischemic change in the white matter. Electronically Signed   By: Marlan Palau M.D.   On: 08/16/2020 15:17   DG Chest Port 1 View  Result Date: 08/16/2020 CLINICAL DATA:  Fever. EXAM: PORTABLE CHEST 1 VIEW COMPARISON:  August 18, 2019 FINDINGS: The  heart size and mediastinal contours are within normal limits. Both lungs are clear. The visualized skeletal structures are unremarkable. IMPRESSION: No active disease. Electronically Signed   By: Katherine Mantle M.D.   On: 08/16/2020 02:10   DG Abd Portable 1V  Result Date: 08/16/2020 CLINICAL DATA:  59 year old male under evaluation prior to MRI for pre MRI screening. EXAM: PORTABLE ABDOMEN - 1 VIEW COMPARISON:  05/03/2010. FINDINGS: Gas and stool are seen scattered throughout the colon extending to the level of the distal rectum. No pathologic distension of small bowel is noted. No gross evidence of pneumoperitoneum. Surgical clips project over the right upper quadrant of the abdomen, likely from prior cholecystectomy. IMPRESSION: 1. Status post cholecystectomy. 2. No other unexpected metallic foreign bodies in the abdomen or pelvis. 3. Nonobstructive bowel gas pattern. Electronically Signed   By: Trudie Reed M.D.   On: 08/16/2020 13:14   MR BRAIN WO CONTRAST  Final Result  DG Abd Portable 1V  Final Result    DG Chest Port 1 View  Final Result    CT Head Wo Contrast  Final Result      Consults called:  Psychiatry called from ER  EKG: Independently reviewed. Prolonged QTC.  Inverted T waves V2 through V5   Lewie Chamberavid Danny Yackley, MD [facility] 08/16/2020, 11:13 PM

## 2020-08-16 NOTE — ED Provider Notes (Signed)
Per Ala Dach, TTS, patient has been evaluated and does not meet criteria for inpatient admission.  Patient has been seen in the past 2 days for altered mental status since being in jail.  The officer reports he thinks he has been in jail for about 10 months.  When I review his vital signs he has had a persistent tachycardia, he had a low-grade temp of 99.6.   When I try to talk to the patient he seems confused, he sort of mumbles.  He talks but it is hard to understand what he saying, he does not always talk about what you are saying.  He is not able to tell me how he feels.  He feels warm to touch.  His initial temperature at 930 was 99.6.  His temperature has been ordered to be rechecked twice but has not been done yet.  He has a persistent tachycardia.  Repeat temperature was without fever.  His initial lactic acid was borderline high.  03:37 AM Dr Particia Nearing, [facility] ED made aware of need to transfer for MR of brain.   After looking at the time and the logistics and patient getting in the queue to get MR at [facility] I decided to keep him here and get it first thing in the morning.  This is already been going on for about 24 hours so he is out of the window for TPA.  Pt left with Dr Bernette Mayers at change of shift to get MR brain done.   Results for orders placed or performed during the hospital encounter of 08/15/20  CBC with Differential  Result Value Ref Range   WBC 9.5 4.0 - 10.5 K/uL   RBC 5.13 4.22 - 5.81 MIL/uL   Hemoglobin 14.0 13.0 - 17.0 g/dL   HCT 97.9 39 - 52 %   MCV 85.0 80.0 - 100.0 fL   MCH 27.3 26.0 - 34.0 pg   MCHC 32.1 30.0 - 36.0 g/dL   RDW 48.0 16.5 - 53.7 %   Platelets 315 150 - 400 K/uL   nRBC 0.0 0.0 - 0.2 %   Neutrophils Relative % 63 %   Neutro Abs 5.9 1.7 - 7.7 K/uL   Lymphocytes Relative 26 %   Lymphs Abs 2.5 0.7 - 4.0 K/uL   Monocytes Relative 11 %   Monocytes Absolute 1.1 (H) 0.1 - 1.0 K/uL   Eosinophils Relative 0 %   Eosinophils Absolute 0.0 0.0 - 0.5 K/uL    Basophils Relative 0 %   Basophils Absolute 0.0 0.0 - 0.1 K/uL   Immature Granulocytes 0 %   Abs Immature Granulocytes 0.03 0.00 - 0.07 K/uL  Comprehensive metabolic panel  Result Value Ref Range   Sodium 138 135 - 145 mmol/L   Potassium 3.3 (L) 3.5 - 5.1 mmol/L   Chloride 100 98 - 111 mmol/L   CO2 25 22 - 32 mmol/L   Glucose, Bld 131 (H) 70 - 99 mg/dL   BUN 26 (H) 6 - 20 mg/dL   Creatinine, Ser 4.82 0.61 - 1.24 mg/dL   Calcium 9.2 8.9 - 70.7 mg/dL   Total Protein 8.5 (H) 6.5 - 8.1 g/dL   Albumin 4.2 3.5 - 5.0 g/dL   AST 26 15 - 41 U/L   ALT 26 0 - 44 U/L   Alkaline Phosphatase 43 38 - 126 U/L   Total Bilirubin 1.2 0.3 - 1.2 mg/dL   GFR, Estimated >86 >75 mL/min   Anion gap 13 5 - 15  TSH  Result Value Ref Range   TSH 2.372 0.350 - 4.500 uIU/mL  Magnesium  Result Value Ref Range   Magnesium 2.4 1.7 - 2.4 mg/dL  Ethanol  Result Value Ref Range   Alcohol, Ethyl (B) <10 <10 mg/dL  Salicylate level  Result Value Ref Range   Salicylate Lvl <7.0 (L) 7.0 - 30.0 mg/dL  Acetaminophen level  Result Value Ref Range   Acetaminophen (Tylenol), Serum <10 (L) 10 - 30 ug/mL    Urinalysis    Component Value Date/Time   COLORURINE YELLOW 08/14/2020 1748   APPEARANCEUR CLEAR 08/14/2020 1748   LABSPEC 1.016 08/14/2020 1748   PHURINE 5.0 08/14/2020 1748   GLUCOSEU NEGATIVE 08/14/2020 1748   HGBUR NEGATIVE 08/14/2020 1748   BILIRUBINUR NEGATIVE 08/14/2020 1748   KETONESUR NEGATIVE 08/14/2020 1748   PROTEINUR NEGATIVE 08/14/2020 1748   NITRITE NEGATIVE 08/14/2020 1748   LEUKOCYTESUR NEGATIVE 08/14/2020 1748      CT Head Wo Contrast  Result Date: 08/15/2020 CLINICAL DATA:  Possible stroke EXAM: CT HEAD WITHOUT CONTRAST TECHNIQUE: Contiguous axial images were obtained from the base of the skull through the vertex without intravenous contrast. COMPARISON:  CT 08/14/2020 FINDINGS: Brain: No evidence of acute infarction, hemorrhage, hydrocephalus, extra-axial collection, visible mass  lesion or mass effect. Stable benign dural calcifications. Vascular: No hyperdense vessel or unexpected calcification. Skull: There is some questionable thickening and possible contusive change the left parietal scalp without subjacent calvarial fracture. No other acute osseous or soft tissue abnormality of skull scalp tissues. Sinuses/Orbits: Paranasal sinuses and mastoid air cells are predominantly clear. Included orbital structures are unremarkable. Other: None. IMPRESSION: 1. No acute intracranial abnormality. 2. Thickening, possibly contusive change, of the left parietal scalp without subjacent calvarial fracture. Correlate for point tenderness. Electronically Signed   By: Kreg Shropshire M.D.   On: 08/15/2020 23:07   CT Head Wo Contrast  Result Date: 08/14/2020 CLINICAL DATA:  Mental status change EXAM: CT HEAD WITHOUT CONTRAST TECHNIQUE: Contiguous axial images were obtained from the base of the skull through the vertex without intravenous contrast. COMPARISON:  None. FINDINGS: Brain: No evidence of acute infarction, hemorrhage, hydrocephalus, extra-axial collection or mass lesion/mass effect. Vascular: Negative for hyperdense vessel Skull: Negative Sinuses/Orbits: Paranasal sinuses clear.  Negative orbit Other: None IMPRESSION: Negative CT head Electronically Signed   By: Marlan Palau M.D.   On: 08/14/2020 19:39   Diagnoses that have been ruled out:  None  Diagnoses that are still under consideration:  None  Final diagnoses:  Altered mental status, unspecified altered mental status type   Disposition pending  Devoria Albe, MD, Concha Pyo, MD 08/16/20 [phone]

## 2020-08-16 NOTE — ED Provider Notes (Signed)
Care of the patient assumed at the change of shift. Patient here from prison two days in a row for AMS. No specific cause found at either visit. Evaluated by TTS who report patient did not participate in that evaluation but that he did not meet criteria for inpatient admission. Per previous provider notes, prison provider was concerned about possible stroke. Patient is currently pending an MRI brain.   Physical Exam  BP (!) 149/80   Pulse 100   Temp 98.1 F (36.7 C) (Axillary)   Resp 20   Ht 5\' 9"  (1.753 m)   SpO2 100%   BMI 49.03 kg/m   Physical Exam Awake, does not respond much to questions  ED Course/Procedures   Clinical Course as of Aug 16 1510  08-12-2001 Aug 16, 2020  1432 MRI tech came to get patient but required an abdominal film to rule out metallic FB due to patient's inability to give a thorough history.    [CS]  1510 Care of the patient signed out to Dr. Aug 18, 2020 at the change of shift.    [CS]    Clinical Course User Index [CS] Fredderick Phenix, MD    Procedures  MDM         Pollyann Savoy, MD 08/16/20 1511

## 2020-08-17 ENCOUNTER — Inpatient Hospital Stay (HOSPITAL_COMMUNITY)
Admit: 2020-08-17 | Discharge: 2020-08-17 | Disposition: A | Discharge status: Home / Self Care | Attending: Internal Medicine | Admitting: Internal Medicine

## 2020-08-17 DIAGNOSIS — E876 Hypokalemia: Secondary | ICD-10-CM | POA: Diagnosis present

## 2020-08-17 DIAGNOSIS — E860 Dehydration: Secondary | ICD-10-CM | POA: Diagnosis present

## 2020-08-17 DIAGNOSIS — I10 Essential (primary) hypertension: Secondary | ICD-10-CM | POA: Diagnosis present

## 2020-08-17 DIAGNOSIS — G9340 Encephalopathy, unspecified: Secondary | ICD-10-CM | POA: Diagnosis present

## 2020-08-17 DIAGNOSIS — R4182 Altered mental status, unspecified: Secondary | ICD-10-CM | POA: Diagnosis present

## 2020-08-17 DIAGNOSIS — F444 Conversion disorder with motor symptom or deficit: Secondary | ICD-10-CM | POA: Diagnosis not present

## 2020-08-17 DIAGNOSIS — G9341 Metabolic encephalopathy: Secondary | ICD-10-CM | POA: Diagnosis present

## 2020-08-17 DIAGNOSIS — Z6841 Body Mass Index (BMI) 40.0 and over, adult: Secondary | ICD-10-CM | POA: Diagnosis not present

## 2020-08-17 DIAGNOSIS — Z79899 Other long term (current) drug therapy: Secondary | ICD-10-CM | POA: Diagnosis not present

## 2020-08-17 DIAGNOSIS — N179 Acute kidney failure, unspecified: Secondary | ICD-10-CM | POA: Diagnosis present

## 2020-08-17 DIAGNOSIS — Z20822 Contact with and (suspected) exposure to covid-19: Secondary | ICD-10-CM | POA: Diagnosis present

## 2020-08-17 DIAGNOSIS — Z87891 Personal history of nicotine dependence: Secondary | ICD-10-CM | POA: Diagnosis not present

## 2020-08-17 LAB — COMPREHENSIVE METABOLIC PANEL
ALT: 23 U/L (ref 0–44)
AST: 26 U/L (ref 15–41)
Albumin: 3.8 g/dL (ref 3.5–5.0)
Alkaline Phosphatase: 40 U/L (ref 38–126)
Anion gap: 9 (ref 5–15)
BUN: 19 mg/dL (ref 6–20)
CO2: 28 mmol/L (ref 22–32)
Calcium: 8.8 mg/dL — ABNORMAL LOW (ref 8.9–10.3)
Chloride: 104 mmol/L (ref 98–111)
Creatinine, Ser: 0.67 mg/dL (ref 0.61–1.24)
GFR, Estimated: >60 mL/min (ref >60)
Glucose, Bld: 74 mg/dL (ref 70–99)
Potassium: 3.4 mmol/L — ABNORMAL LOW (ref 3.5–5.1)
Sodium: 141 mmol/L (ref 135–145)
Total Bilirubin: 1.1 mg/dL (ref 0.3–1.2)
Total Protein: 8 g/dL (ref 6.5–8.1)

## 2020-08-17 LAB — RAPID URINE DRUG SCREEN, HOSP PERFORMED
Amphetamines: NOT DETECTED
Barbiturates: NOT DETECTED
Benzodiazepines: NOT DETECTED
Cocaine: NOT DETECTED
Opiates: NOT DETECTED
Tetrahydrocannabinol: NOT DETECTED

## 2020-08-17 LAB — CBC WITH DIFFERENTIAL/PLATELET
Abs Immature Granulocytes: 0.02 10*3/uL (ref 0.00–0.07)
Basophils Absolute: 0 10*3/uL (ref 0.0–0.1)
Basophils Relative: 0 %
Eosinophils Absolute: 0 10*3/uL (ref 0.0–0.5)
Eosinophils Relative: 0 %
HCT: 44.5 % (ref 39.0–52.0)
Hemoglobin: 13.9 g/dL (ref 13.0–17.0)
Immature Granulocytes: 0 %
Lymphocytes Relative: 29 %
Lymphs Abs: 2.7 10*3/uL (ref 0.7–4.0)
MCH: 27.3 pg (ref 26.0–34.0)
MCHC: 31.2 g/dL (ref 30.0–36.0)
MCV: 87.3 fL (ref 80.0–100.0)
Monocytes Absolute: 0.9 10*3/uL (ref 0.1–1.0)
Monocytes Relative: 9 %
Neutro Abs: 5.8 10*3/uL (ref 1.7–7.7)
Neutrophils Relative %: 62 %
Platelets: 303 10*3/uL (ref 150–400)
RBC: 5.1 MIL/uL (ref 4.22–5.81)
RDW: 14.3 % (ref 11.5–15.5)
WBC: 9.5 10*3/uL (ref 4.0–10.5)
nRBC: 0 % (ref 0.0–0.2)

## 2020-08-17 LAB — URINALYSIS, ROUTINE W REFLEX MICROSCOPIC
Bilirubin Urine: NEGATIVE
Glucose, UA: NEGATIVE mg/dL
Hgb urine dipstick: NEGATIVE
Ketones, ur: 20 mg/dL — AB
Leukocytes,Ua: NEGATIVE
Nitrite: NEGATIVE
Protein, ur: NEGATIVE mg/dL
Specific Gravity, Urine: 1.026 (ref 1.005–1.030)
pH: 5 (ref 5.0–8.0)

## 2020-08-17 LAB — BLOOD GAS, ARTERIAL
Acid-Base Excess: 1.1 mmol/L (ref 0.0–2.0)
Bicarbonate: 25.7 mmol/L (ref 20.0–28.0)
FIO2: 21
O2 Saturation: 95 %
Patient temperature: 98.8
pCO2 arterial: 43.1 mmHg (ref 32.0–48.0)
pH, Arterial: 7.393 (ref 7.350–7.450)
pO2, Arterial: 75.3 mmHg — ABNORMAL LOW (ref 83.0–108.0)

## 2020-08-17 LAB — URINE CULTURE: Culture: NO GROWTH

## 2020-08-17 LAB — MAGNESIUM: Magnesium: 2.6 mg/dL — ABNORMAL HIGH (ref 1.7–2.4)

## 2020-08-17 LAB — AMMONIA: Ammonia: 18 umol/L (ref 9–35)

## 2020-08-17 LAB — HIV ANTIBODY (ROUTINE TESTING W REFLEX): HIV Screen 4th Generation wRfx: NONREACTIVE

## 2020-08-17 MED ORDER — DEXTROSE 5 % IV SOLN
750.0000 mg | Freq: Three times a day (TID) | INTRAVENOUS | Status: DC
  Administered 2020-08-17 – 2020-08-19 (×6): 750 mg via INTRAVENOUS
  Filled 2020-08-17 (×7): qty 15

## 2020-08-17 MED ORDER — POTASSIUM CHLORIDE 10 MEQ/100ML IV SOLN
INTRAVENOUS | Status: AC
  Filled 2020-08-17: qty 100

## 2020-08-17 MED ORDER — POTASSIUM CHLORIDE 10 MEQ/100ML IV SOLN
10.0000 meq | INTRAVENOUS | Status: AC
  Administered 2020-08-17 (×4): 10 meq via INTRAVENOUS
  Filled 2020-08-17: qty 100

## 2020-08-17 NOTE — ED Notes (Signed)
Pt is laying in bed with eyes open, he does seem to track movement in room but can not follow commands.  Cleaned face with washcloth, await [facility] and evaluation by psychiatry.

## 2020-08-17 NOTE — ED Notes (Signed)
Per MD Rhona Leavens, waiting for call from neuro. Will keep in ED for now and then decide if pt meets criteria for [facility] or [place].

## 2020-08-17 NOTE — Procedures (Signed)
Patient Name: Caleb Leblanc  MRN: [id number]  Epilepsy Attending: Charlsie Quest  Referring Physician/Provider: Dr Rickey Barbara Date: 08/17/2020 Duration: 25.09 mins  Patient history: 59yo M with ams. EEG to evaluate for seizure.  Level of alertness: Awake  AEDs during EEG study: None  Technical aspects: This EEG study was done with scalp electrodes positioned according to the 10-20 International system of electrode placement. Electrical activity was acquired at a sampling rate of 500Hz  and reviewed with a high frequency filter of 70Hz  and a low frequency filter of 1Hz . EEG data were recorded continuously and digitally stored.   Description: The posterior dominant rhythm consists of 9-10 Hz activity of moderate voltage (25-35 uV) seen predominantly in posterior head regions, symmetric and reactive to eye opening and eye closing. Hyperventilation and photic stimulation were not performed.     IMPRESSION: This study is within normal limits. No seizures or epileptiform discharges were seen throughout the recording.  Virgal Warmuth 

## 2020-08-17 NOTE — BH Assessment (Signed)
Informed RN Abby to enter psych consult if additional support is needed by psychiatry.

## 2020-08-17 NOTE — Progress Notes (Signed)
EEG completed, results pending. 

## 2020-08-17 NOTE — ED Notes (Signed)
Pt is on hospital stretcher in his thermals with condom cath in place and warm blankets.  No IV fluids infusing.  Started back IV fluids at this time.  Hospital bed ordered in order to move pt for his comfort.  Pt has 2 officers at the bedside.

## 2020-08-17 NOTE — Progress Notes (Signed)
PROGRESS NOTE    Gilberto BetterJoseph J Bratcher  ZOX:[id number] DOB: Feb 07, 1961 DOA: 08/15/2020 PCP: Patient, No Pcp Per    Brief Narrative:  59yo with hx HTN who was brought from ED with acute mental status change. Initial workup including head CT on 11/5 was found to be largely unremarkable. Pt was discharged to jail but returned with continued mental status change. On return from jail, pt was noted to be not responsive or communicative with staff. Psychiatry was consulted and pt did not participate in eval, since been cleared by Psych. Prior to return to jail again, pt noted to have worsening in mentation, prompting f/u MRI brain which was found to be neg. Hospitalist was later consulted for consideration for admission.   Of note, UDS, ammonia, ABG were found to be neg. COVID neg  Assessment & Plan:   Principal Problem:   AMS (altered mental status) Active Problems:   AKI (acute kidney injury) (HCC)   Hypokalemia   Encephalopathy   1. Acute toxic metabolic encephalopathy 1. Unclear etiology. Today, unable to elicit blink reflex or response with deep sternal rub/painful stimuli 2. CT head and MRI brain reviewed, found to be unremarkable 3. Electrolytes and renal function unremarkable 4. Ammonia, ABG, UDS unremarkable 5. Have ordered EEG, pending 6. Did discuss case with ID who recommended empiric acyclovir for now while work up is in progress to cover ?encephalitis as well as LP 7. Discussed with Neuorlogy who will f/u on EEG. [facility] 2. ARF 1. Cr noted to be as high as 1.23 2. Cr normalized with hydration 3. Hypokalemia 1. Mildly low at 3.4 2. Will order 40mEq KCL 3. Repeat bmet in AM 4. Obesity 1. Recommend diet/lifestyle modification  DVT prophylaxis: Heparin subq Code Status: Full Family Communication: Pt in room  Status is: Inpatient  Remains inpatient appropriate because:Altered mental status, Unsafe d/c plan and Inpatient  level of care appropriate due to severity of illness   [facility]: [facility]              [facility] [facility] to[facility]              Anticipated d/c date is: 3 days              Patient currently is not medically stable to d/c.       Consultants:   Neurology  Procedures:     Antimicrobials: Anti-infectives (From admission, onward)   Start     Dose/Rate Route Frequency Ordered Stop   08/17/20 1330  acyclovir (ZOVIRAX) 750 mg in dextrose 5 % 150 mL IVPB        750 mg 165 mL/hr over 60 Minutes Intravenous Every 8 hours 08/17/20 1303         Subjective: Unable to assess given current mentation  Objective: Vitals:   08/17/20 1200 08/17/20 1230 08/17/20 1300 08/17/20 1415  BP: (!) 155/81 (!) 143/73 (!) 146/76 133/78  Pulse:   75 83  Resp: 16 20 17 15   Temp:      TempSrc:      SpO2:   100% 100%  Height:        Intake/Output Summary (Last 24 hours) at 08/17/2020 1459 Last data filed at 08/17/2020 1015 Gross per 24 hour  Intake 1010 ml  Output --  Net 1010 ml   There were no vitals filed for this visit.  Examination:  General exam: Appears calm and comfortable  Respiratory system: Clear to auscultation. Respiratory effort normal. Cardiovascular system: S1 & S2 heard, Regular Gastrointestinal system: Abdomen is nondistended, soft No organomegaly or masses felt. Normal bowel sounds heard. Central nervous system: Eyes open but unable to elicit blink reflex, No response from deep sternal rub. No focal neurological deficits. Extremities: Symmetric 5 x 5 power. Skin: No rashes, lesions Psychiatry: Unable to assess given current mentation  Data Reviewed: I have personally reviewed following labs and imaging studies  CBC: Recent Labs  Lab 08/14/20 1820 08/15/20 2148 08/17/20 0500  WBC 9.6 9.5 9.5  NEUTROABS 5.6 5.9 5.8  HGB 15.1 14.0 13.9  HCT 44.8 43.6 44.5  MCV 83.9 85.0 87.3  PLT 337 315 303   Basic Metabolic Panel: Recent Labs  Lab  08/14/20 1820 08/15/20 2148 08/17/20 0500  NA 136 138 141  K 2.6* 3.3* 3.4*  CL 95* 100 104  CO2 26 25 28   GLUCOSE 114* 131* 74  BUN 14 26* 19  CREATININE 0.87 1.23 0.67  CALCIUM 8.8* 9.2 8.8*  MG 2.4 2.4 2.6*   GFR: CrCl cannot be calculated (Unknown ideal weight.). Liver Function Tests: Recent Labs  Lab 08/14/20 1820 08/15/20 2148 08/17/20 0500  AST 21 26 26   ALT 21 26 23   ALKPHOS 43 43 40  BILITOT 0.6 1.2 1.1  PROT 8.6* 8.5* 8.0  ALBUMIN 4.0 4.2 3.8   No results for input(s): LIPASE, AMYLASE in the last 168 hours. Recent Labs  Lab 08/17/20 1232  AMMONIA 18   Coagulation Profile: No results for input(s): INR, PROTIME in the last 168 hours. Cardiac Enzymes: No results for input(s): CKTOTAL, CKMB, CKMBINDEX, TROPONINI in the last 168 hours. BNP (last 3 results) No results for input(s): PROBNP in the last 8760 hours. HbA1C: No results for input(s): HGBA1C in the last 72 hours. CBG: Recent Labs  Lab 08/14/20 1721  GLUCAP 108*   Lipid Profile: No results for input(s): CHOL, HDL, LDLCALC, TRIG, CHOLHDL, LDLDIRECT in the last 72 hours. Thyroid Function Tests: Recent Labs    08/15/20 2148  TSH 2.372   Anemia Panel: No results for input(s): VITAMINB12, FOLATE, FERRITIN, TIBC, IRON, RETICCTPCT in the last 72 hours. Sepsis Labs: Recent Labs  Lab 08/16/20 0144 08/16/20 0328  LATICACIDVEN 2.1* 1.4    Recent Results (from the past 240 hour(s))  Urine culture     Status: None   Collection Time: 08/15/20  9:49 PM   Specimen: Urine, Clean Catch  Result Value Ref Range Status   Specimen Description   Final    URINE, CLEAN CATCH Performed at [facility], [address] [place], [place]    Special Requests   Final    NONE Performed at [facility], [address] [place], [place]    Culture   Final    NO GROWTH Performed at [facility], [address] [place], [facility] [address]     Report Status 08/17/2020 FINAL  Final  Respiratory Panel by RT PCR (Flu A&B, Covid) - Nasopharyngeal Swab     Status: None   Collection Time: 08/15/20 11:47 PM   Specimen: Nasopharyngeal Swab  Result Value Ref Range Status   SARS Coronavirus 2 by RT PCR NEGATIVE NEGATIVE Final    Comment: (NOTE) SARS-CoV-2 target nucleic acids are NOT DETECTED.  The SARS-CoV-2 RNA is generally detectable in upper respiratoy specimens during the acute phase of infection. The lowest concentration of SARS-CoV-2 viral copies this assay can detect  is 131 copies/mL. A negative result does not preclude SARS-Cov-2 infection and should not be used as the sole basis for treatment or other patient management decisions. A negative result may occur with  improper specimen collection/handling, submission of specimen other than nasopharyngeal swab, presence of viral mutation(s) within the areas targeted by this assay, and inadequate number of viral copies (<131 copies/mL). A negative result must be combined with clinical observations, patient history, and epidemiological information. The expected result is Negative.  Fact Sheet for Patients:  https://www.moore.com/  Fact Sheet for Healthcare Providers:  https://www.young.biz/  This test is no t yet approved or cleared by the [place] FDA and  has been authorized for detection and/or diagnosis of SARS-CoV-2 by FDA under an Emergency Use Authorization (EUA). This EUA will remain  in effect (meaning this test can be used) for the duration of the COVID-19 declaration under Section 564(b)(1) of the Act, 21 U.S.C. section 360bbb-3(b)(1), unless the authorization is terminated or revoked sooner.     Influenza A by PCR NEGATIVE NEGATIVE Final   Influenza B by PCR NEGATIVE NEGATIVE Final    Comment: (NOTE) The Xpert Xpress SARS-CoV-2/FLU/RSV assay is intended as an aid in  the diagnosis of influenza from Nasopharyngeal swab  specimens and  should not be used as a sole basis for treatment. Nasal washings and  aspirates are unacceptable for Xpert Xpress SARS-CoV-2/FLU/RSV  testing.  Fact Sheet for Patients: https://www.moore.com/  Fact Sheet for Healthcare Providers: https://www.young.biz/  This test is not yet approved or cleared by the [place] FDA and  has been authorized for detection and/or diagnosis of SARS-CoV-2 by  FDA under an Emergency Use Authorization (EUA). This EUA will remain  in effect (meaning this test can be used) for the duration of the  Covid-19 declaration under Section 564(b)(1) of the Act, 21  U.S.C. section 360bbb-3(b)(1), unless the authorization is  terminated or revoked. Performed at [facility], [address] [place], [place] [address]   Culture, blood (routine x 2)     Status: None (Preliminary result)   Collection Time: 08/16/20  1:13 AM   Specimen: BLOOD  Result Value Ref Range Status   Specimen Description   Final    BLOOD RIGHT ANTECUBITAL Performed at [facility], [address], [place], [place] [address]    Special Requests   Final    BOTTLES DRAWN AEROBIC AND ANAEROBIC Blood Culture adequate volume Performed at [facility], [address] [place], [place] [address]    Culture   Final    NO GROWTH 1 DAY Performed at [facility], [address] [place], [place] [address]    Report Status PENDING  Incomplete     Radiology Studies: CT Head Wo Contrast  Result Date: 08/15/2020 CLINICAL DATA:  Possible stroke EXAM: CT HEAD WITHOUT CONTRAST TECHNIQUE: Contiguous axial images were obtained from the base of the skull through the vertex without intravenous contrast. COMPARISON:  CT 08/14/2020 FINDINGS: Brain: No evidence of acute infarction, hemorrhage, hydrocephalus, extra-axial collection, visible mass lesion or mass effect. Stable benign dural  calcifications. Vascular: No hyperdense vessel or unexpected calcification. Skull: There is some questionable thickening and possible contusive change the left parietal scalp without subjacent calvarial fracture. No other acute osseous or soft tissue abnormality of skull scalp tissues. Sinuses/Orbits: Paranasal sinuses and mastoid air cells are predominantly clear. Included orbital structures are unremarkable. Other: None. IMPRESSION: 1. No acute intracranial abnormality. 2. Thickening, possibly contusive change,  of the left parietal scalp without subjacent calvarial fracture. Correlate for point tenderness. Electronically Signed   By: Kreg Shropshire M.D.   On: 08/15/2020 23:07   MR BRAIN WO CONTRAST  Result Date: 08/16/2020 CLINICAL DATA:  Mental status change EXAM: MRI HEAD WITHOUT CONTRAST TECHNIQUE: Multiplanar, multiecho pulse sequences of the brain and surrounding structures were obtained without intravenous contrast. COMPARISON:  CT head 08/15/2020 FINDINGS: Brain: Limited study. Images degraded by motion. Patient not able to complete all sequences. Negative for acute infarct. Mild white matter changes consistent with chronic microvascular ischemia. Negative for hemorrhage or mass. Ventricle size normal. Vascular: Normal arterial flow voids. Skull and upper cervical spine: Negative Sinuses/Orbits: Negative Other: None IMPRESSION: Limited study, degraded by motion and incomplete. Negative for acute infarct. Mild chronic microvascular ischemic change in the white matter. Electronically Signed   By: Marlan Palau M.D.   On: 08/16/2020 15:17   DG Chest Port 1 View  Result Date: 08/16/2020 CLINICAL DATA:  Fever. EXAM: PORTABLE CHEST 1 VIEW COMPARISON:  August 18, 2019 FINDINGS: The heart size and mediastinal contours are within normal limits. Both lungs are clear. The visualized skeletal structures are unremarkable. IMPRESSION: No active disease. Electronically Signed   By: Katherine Mantle M.D.   On:  08/16/2020 02:10   DG Abd Portable 1V  Result Date: 08/16/2020 CLINICAL DATA:  59 year old male under evaluation prior to MRI for pre MRI screening. EXAM: PORTABLE ABDOMEN - 1 VIEW COMPARISON:  05/03/2010. FINDINGS: Gas and stool are seen scattered throughout the colon extending to the level of the distal rectum. No pathologic distension of small bowel is noted. No gross evidence of pneumoperitoneum. Surgical clips project over the right upper quadrant of the abdomen, likely from prior cholecystectomy. IMPRESSION: 1. Status post cholecystectomy. 2. No other unexpected metallic foreign bodies in the abdomen or pelvis. 3. Nonobstructive bowel gas pattern. Electronically Signed   By: Trudie Reed M.D.   On: 08/16/2020 13:14    Scheduled Meds: . amLODipine  10 mg Oral Daily  . heparin  5,000 Units Subcutaneous Q8H  . sodium chloride flush  3 mL Intravenous Q12H   Continuous Infusions: . sodium chloride Stopped (08/17/20 1015)  . acyclovir 750 mg (08/17/20 1356)     LOS: 0 days   Rickey Barbara, MD [facility] Pager On Amion  If 7PM-7AM, please contact night-coverage 08/17/2020, 2:59 PM

## 2020-08-17 NOTE — Progress Notes (Signed)
Pharmacy Antibiotic Note  Caleb Leblanc is a 59 y.o. 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  Pharmacy has been consulted for Acyclovir dosing.  Afebrile, WBC wnl, SCr wnl Anticipate Neuro consult   Plan: Requested current weight, not yet available Previous weight from [facility] from October of 2020 was 152kg Dose basis: use Ideal Body Weight Acyclovir 750mg  IV q8  Height: 5\' 9"  (175.3 cm) IBW/kg (Calculated) : 70.7  Temp (24hrs), Avg:97.6 F (36.4 C), Min:97.6 F (36.4 C), Max:97.6 F (36.4 C)  Recent Labs  Lab 08/14/20 1820 08/15/20 2148 08/16/20 0144 08/16/20 0328 08/17/20 0500  WBC 9.6 9.5  --   --  9.5  CREATININE 0.87 1.23  --   --  0.67  LATICACIDVEN  --   --  2.1* 1.4  --     CrCl cannot be calculated (Unknown ideal weight.).    Allergies  Allergen Reactions  . Amoxicillin Anaphylaxis  . Augmentin [Amoxicillin-Pot Clavulanate] Anaphylaxis   Antimicrobials this admission: 11/8 Acyclovir >>   Dose adjustments this admission:  Microbiology results: 11/7 BCx x 1set: ngtd 11/6 UCx: ng-final   Thank you for allowing pharmacy to be a part of this patient's care.  13/7 PharmD [facility] Rx [id number] 08/17/2020 1:04 PM

## 2020-08-17 NOTE — ED Notes (Signed)
This RN went into pt room to assess. Pt was lying in bed vitals WDL, but pt not verbally responding. This RN introduced self and asked pt name and date of birth. Pt unable to follow commands and respond. Will continue to monitor.

## 2020-08-17 NOTE — ED Notes (Signed)
This RN cleaned pt eyes with wet rag. Pt alert to voice and able to look from side to side. Pupils equal and responsive to light. Pt still nonverbal.

## 2020-08-18 DIAGNOSIS — G9340 Encephalopathy, unspecified: Secondary | ICD-10-CM

## 2020-08-18 DIAGNOSIS — R4182 Altered mental status, unspecified: Secondary | ICD-10-CM | POA: Diagnosis present

## 2020-08-18 DIAGNOSIS — F444 Conversion disorder with motor symptom or deficit: Secondary | ICD-10-CM

## 2020-08-18 LAB — COMPREHENSIVE METABOLIC PANEL
ALT: 22 U/L (ref 0–44)
AST: 20 U/L (ref 15–41)
Albumin: 3.2 g/dL — ABNORMAL LOW (ref 3.5–5.0)
Alkaline Phosphatase: 34 U/L — ABNORMAL LOW (ref 38–126)
Anion gap: 9 (ref 5–15)
BUN: 14 mg/dL (ref 6–20)
CO2: 25 mmol/L (ref 22–32)
Calcium: 8.4 mg/dL — ABNORMAL LOW (ref 8.9–10.3)
Chloride: 108 mmol/L (ref 98–111)
Creatinine, Ser: 0.76 mg/dL (ref 0.61–1.24)
GFR, Estimated: >60 mL/min (ref >60)
Glucose, Bld: 70 mg/dL (ref 70–99)
Potassium: 3.2 mmol/L — ABNORMAL LOW (ref 3.5–5.1)
Sodium: 142 mmol/L (ref 135–145)
Total Bilirubin: 1 mg/dL (ref 0.3–1.2)
Total Protein: 7 g/dL (ref 6.5–8.1)

## 2020-08-18 LAB — CBC
HCT: 39.8 % (ref 39.0–52.0)
Hemoglobin: 12.6 g/dL — ABNORMAL LOW (ref 13.0–17.0)
MCH: 27.4 pg (ref 26.0–34.0)
MCHC: 31.7 g/dL (ref 30.0–36.0)
MCV: 86.5 fL (ref 80.0–100.0)
Platelets: 258 10*3/uL (ref 150–400)
RBC: 4.6 MIL/uL (ref 4.22–5.81)
RDW: 14.4 % (ref 11.5–15.5)
WBC: 7.5 10*3/uL (ref 4.0–10.5)
nRBC: 0 % (ref 0.0–0.2)

## 2020-08-18 LAB — MAGNESIUM: Magnesium: 2.4 mg/dL (ref 1.7–2.4)

## 2020-08-18 MED ORDER — POTASSIUM CHLORIDE 10 MEQ/100ML IV SOLN
10.0000 meq | INTRAVENOUS | Status: AC
  Administered 2020-08-18 (×5): 10 meq via INTRAVENOUS
  Filled 2020-08-18 (×5): qty 100

## 2020-08-18 NOTE — ED Notes (Signed)
Meal tray offered. Pt still not offering any communication or response. Condom cath in place for incontinence and skin integrity. Pt flinches eyes with intrusion bilaterally. No response to sternal rub. Guards remain at bedside.

## 2020-08-18 NOTE — ED Notes (Signed)
Hospitalist at bedside 

## 2020-08-18 NOTE — ED Notes (Signed)
Pt was moved on to a hospital bed with clean sheets.  Pt was changed out of his thermals and into hospital gown after cleaning him off with a warm washcloth.  Pt tolerated this well, repositioned pt for comfort.

## 2020-08-18 NOTE — Progress Notes (Signed)
PROGRESS NOTE    Caleb Leblanc  SPQ:[id number] DOB: Apr 07, 1961 DOA: 08/15/2020 PCP: Patient, No Pcp Per    Brief Narrative:  59yo with hx HTN who was brought from ED with acute mental status change. Initial workup including head CT on 11/5 was found to be largely unremarkable. Pt was discharged to jail but returned with continued mental status change. On return from jail, pt was noted to be not responsive or communicative with staff. Psychiatry was consulted and pt did not participate in eval, since been cleared by Psych. Prior to return to jail again, pt noted to have worsening in mentation, prompting f/u MRI brain which was found to be neg. Hospitalist was later consulted for consideration for admission.   Of note, UDS, ammonia, ABG were found to be neg. COVID neg  Assessment & Plan:   Principal Problem:   AMS (altered mental status) Active Problems:   AKI (acute kidney injury) (HCC)   Hypokalemia   Encephalopathy   1. Acute toxic metabolic encephalopathy 1. Unclear etiology. Today, unable to elicit blink reflex or response with deep sternal rub/painful stimuli 2. CT head and MRI brain reviewed, found to be unremarkable 3. Electrolytes and renal function unremarkable 4. Ammonia, ABG, UDS unremarkable 5. Have ordered EEG and reviewed. No seizure activity 6. Initially discussed case with ID who recommended empiric acyclovir for now while work up is in progress to cover ?encephalitis as well as LP 7. Discussed with Neuorlogy who has seen pt in consultation 8. Will consult psychiatry. Of note, pt has intact corneal reflex and on re-attempt, pt seemed to intentionally close eyes to prevent cornea from being touched 2. ARF 1. Cr noted to be as high as 1.23 2. Cr had normalized with hydration 3. Hypokalemia 1. Mildly low at 3.4 2. Will order KCL 3. Recheckt bmet in AM 4. Obesity 1. Recommend diet/lifestyle modification  DVT prophylaxis: Heparin subq Code Status:  Full Family Communication: Pt in room  Status is: Inpatient  Remains inpatient appropriate because:Altered mental status, Unsafe d/c plan and Inpatient level of care appropriate due to severity of illness   Dispo: The patient is from: [place]              Anticipated d/c is to: [place]              Anticipated d/c date is: 1 day              Patient currently is not medically stable to d/c.    Consultants:   Neurology  Psychiatry  Procedures:     Antimicrobials: Anti-infectives (From admission, onward)   Start     Dose/Rate Route Frequency Ordered Stop   08/17/20 1330  acyclovir (ZOVIRAX) 750 mg in dextrose 5 % 150 mL IVPB        750 mg 165 mL/hr over 60 Minutes Intravenous Every 8 hours 08/17/20 1303        Subjective: Cannot assess given current mentation  Objective: Vitals:   08/18/20 1615 08/18/20 1630 08/18/20 1645 08/18/20 1700  BP: 137/83   138/81  Pulse: 77     Resp: 17 (!) 0 (!) 6 10  Temp: 98.5 F (36.9 C)     TempSrc: Oral     SpO2: 100%     Weight:      Height:        Intake/Output Summary (Last 24 hours) at 08/18/2020 1714 Last data filed at 08/18/2020 1552 Gross per 24 hour  Intake 1693.98 ml  Output 1500 ml  Net 193.98 ml   Filed Weights   08/17/20 1553  Weight: 122.5 kg    Examination: General exam: Awake, laying in bed, in nad, eys seem to be intermittently tracking Respiratory system: Normal respiratory effort, no wheezing Cardiovascular system: regular rate, s1, s2 Gastrointestinal system: Soft, nondistended, positive BS Central nervous system: CN2-12 grossly intact, corneal reflex intact and on re-attempt, pt closes eyes slightly, preventing cornea from being touched again Extremities: Perfused, no clubbing Skin: Normal skin turgor, no notable skin lesions seen Psychiatry: Unable to assess given current mentation  Data Reviewed: I have personally reviewed following labs and imaging studies  CBC: Recent Labs  Lab 08/14/20 1820  08/15/20 2148 08/17/20 0500 08/18/20 0548  WBC 9.6 9.5 9.5 7.5  NEUTROABS 5.6 5.9 5.8  --   HGB 15.1 14.0 13.9 12.6*  HCT 44.8 43.6 44.5 39.8  MCV 83.9 85.0 87.3 86.5  PLT 337 315 303 258   Basic Metabolic Panel: Recent Labs  Lab 08/14/20 1820 08/15/20 2148 08/17/20 0500 08/18/20 0548  NA 136 138 141 142  K 2.6* 3.3* 3.4* 3.2*  CL 95* 100 104 108  CO2 26 25 28 25   GLUCOSE 114* 131* 74 70  BUN 14 26* 19 14  CREATININE 0.87 1.23 0.67 0.76  CALCIUM 8.8* 9.2 8.8* 8.4*  MG 2.4 2.4 2.6* 2.4   GFR: Estimated Creatinine Clearance: 128.5 mL/min (by C-G formula based on SCr of 0.76 mg/dL). Liver Function Tests: Recent Labs  Lab 08/14/20 1820 08/15/20 2148 08/17/20 0500 08/18/20 0548  AST 21 26 26 20   ALT 21 26 23 22   ALKPHOS 43 43 40 34*  BILITOT 0.6 1.2 1.1 1.0  PROT 8.6* 8.5* 8.0 7.0  ALBUMIN 4.0 4.2 3.8 3.2*   No results for input(s): LIPASE, AMYLASE in the last 168 hours. Recent Labs  Lab 08/17/20 1232  AMMONIA 18   Coagulation Profile: No results for input(s): INR, PROTIME in the last 168 hours. Cardiac Enzymes: No results for input(s): CKTOTAL, CKMB, CKMBINDEX, TROPONINI in the last 168 hours. BNP (last 3 results) No results for input(s): PROBNP in the last 8760 hours. HbA1C: No results for input(s): HGBA1C in the last 72 hours. CBG: Recent Labs  Lab 08/14/20 1721  GLUCAP 108*   Lipid Profile: No results for input(s): CHOL, HDL, LDLCALC, TRIG, CHOLHDL, LDLDIRECT in the last 72 hours. Thyroid Function Tests: Recent Labs    08/15/20 2148  TSH 2.372   Anemia Panel: No results for input(s): VITAMINB12, FOLATE, FERRITIN, TIBC, IRON, RETICCTPCT in the last 72 hours. Sepsis Labs: Recent Labs  Lab 08/16/20 0144 08/16/20 0328  LATICACIDVEN 2.1* 1.4    Recent Results (from the past 240 hour(s))  Urine culture     Status: None   Collection Time: 08/15/20  9:49 PM   Specimen: Urine, Clean Catch  Result Value Ref Range Status   Specimen  Description   Final    URINE, CLEAN CATCH Performed at [facility], [address] [place], [place] [address]    Special Requests   Final    NONE Performed at [facility], [address] [place], [place] [address]    Culture   Final    NO GROWTH Performed at [facility], [address] [place], [place] [address]    Report Status 08/17/2020 FINAL  Final  Respiratory Panel by RT PCR (Flu A&B, Covid) - Nasopharyngeal Swab     Status: None   Collection  Time: 08/15/20 11:47 PM   Specimen: Nasopharyngeal Swab  Result Value Ref Range Status   SARS Coronavirus 2 by RT PCR NEGATIVE NEGATIVE Final    Comment: (NOTE) SARS-CoV-2 target nucleic acids are NOT DETECTED.  The SARS-CoV-2 RNA is generally detectable in upper respiratoy specimens during the acute phase of infection. The lowest concentration of SARS-CoV-2 viral copies this assay can detect is 131 copies/mL. A negative result does not preclude SARS-Cov-2 infection and should not be used as the sole basis for treatment or other patient management decisions. A negative result may occur with  improper specimen collection/handling, submission of specimen other than nasopharyngeal swab, presence of viral mutation(s) within the areas targeted by this assay, and inadequate number of viral copies (<131 copies/mL). A negative result must be combined with clinical observations, patient history, and epidemiological information. The expected result is Negative.  Fact Sheet for Patients:  https://www.moore.com/  Fact Sheet for Healthcare Providers:  https://www.young.biz/  This test is no t yet approved or cleared by the [place] FDA and  has been authorized for detection and/or diagnosis of SARS-CoV-2 by FDA under an Emergency Use Authorization (EUA). This EUA will remain  in effect (meaning this test can be used) for the duration of the COVID-19  declaration under Section 564(b)(1) of the Act, 21 U.S.C. section 360bbb-3(b)(1), unless the authorization is terminated or revoked sooner.     Influenza A by PCR NEGATIVE NEGATIVE Final   Influenza B by PCR NEGATIVE NEGATIVE Final    Comment: (NOTE) The Xpert Xpress SARS-CoV-2/FLU/RSV assay is intended as an aid in  the diagnosis of influenza from Nasopharyngeal swab specimens and  should not be used as a sole basis for treatment. Nasal washings and  aspirates are unacceptable for Xpert Xpress SARS-CoV-2/FLU/RSV  testing.  Fact Sheet for Patients: https://www.moore.com/  Fact Sheet for Healthcare Providers: https://www.young.biz/  This test is not yet approved or cleared by the [place] FDA and  has been authorized for detection and/or diagnosis of SARS-CoV-2 by  FDA under an Emergency Use Authorization (EUA). This EUA will remain  in effect (meaning this test can be used) for the duration of the  Covid-19 declaration under Section 564(b)(1) of the Act, 21  U.S.C. section 360bbb-3(b)(1), unless the authorization is  terminated or revoked. Performed at [facility], [address] [place], [place] [address]   Culture, blood (routine x 2)     Status: None (Preliminary result)   Collection Time: 08/16/20  1:13 AM   Specimen: BLOOD  Result Value Ref Range Status   Specimen Description   Final    BLOOD RIGHT ANTECUBITAL Performed at [facility], [address] [place], [place] [address]    Special Requests   Final    BOTTLES DRAWN AEROBIC AND ANAEROBIC Blood Culture adequate volume Performed at [facility], [address] [place], [place] [address]    Culture   Final    NO GROWTH 2 DAYS Performed at [facility], [address] [place], [place] [address]    Report Status PENDING  Incomplete     Radiology Studies: EEG adult  Result Date: 2020-09-03 Charlsie Quest, MD     2020/09/03  5:10 PM Patient Name: SHEENA SIMONIS MRN: [id number] Epilepsy Attending: Charlsie Quest Referring Physician/Provider: Dr Rickey Barbara Date: Sep 03, 2020 Duration: 25.09 mins Patient history: 59yo M with ams. EEG to evaluate for seizure. Level of alertness: Awake AEDs during EEG study: None  Technical aspects: This EEG study was done with scalp electrodes positioned according to the 10-20 International system of electrode placement. Electrical activity was acquired at a sampling rate of 500Hz  and reviewed with a high frequency filter of 70Hz  and a low frequency filter of 1Hz . EEG data were recorded continuously and digitally stored. Description: The posterior dominant rhythm consists of 9-10 Hz activity of moderate voltage (25-35 uV) seen predominantly in posterior head regions, symmetric and reactive to eye opening and eye closing. Hyperventilation and photic stimulation were not performed.   IMPRESSION: This study is within normal limits. No seizures or epileptiform discharges were seen throughout the recording. Priyanka    Scheduled Meds:  amLODipine  10 mg Oral Daily   heparin  5,000 Units Subcutaneous Q8H   sodium chloride flush  3 mL Intravenous Q12H   Continuous Infusions:  sodium chloride 100 mL/hr at 08/18/20 0546   acyclovir 750 mg (08/18/20 1433)     LOS: 1 day   Annabelle Harman, MD [facility] Pager On Amion  If 7PM-7AM, please contact night-coverage 08/18/2020, 5:14 PM

## 2020-08-18 NOTE — ED Notes (Signed)
Pt has been sleeping during the night.  This am pt appears arousable to verbal stimuli.  I asked him to blink which he did not follow this command but he did blink to visual threat.  When I asked pt to squeeze my hand I could feel faint movement in both hands.  Pt also appears to track movement in the room.  Pt has not said anything during this past shift.  Condom cath in place, emptied urine from collection bag.

## 2020-08-18 NOTE — ED Notes (Signed)
Dr. Rhona Leavens notified that pt unable to tolerate anything by mouth via secure chat.

## 2020-08-18 NOTE — Consult Note (Signed)
Neurology consult H&P  ON:[id number]  History is obtained from: RN, attending  HPI: Caleb Leblanc is a 59 y.o. male who was admitted to [facility] after being found with an acute mental status change at the jail. He had previously been hospitalized for the same and improved. However, when he returned to jail, his mental status worsened again. He has been found to be unresponsive and non communicative with staff. He would not participate in exams. Because of the ongoing AMS, neuro was asked to consult.  His workup by IM has been negative including immaging, UDS, ABG and ammonia level.  ROS: A complete ROS was unable to be performed due to patient's lack of participation in exam.    Past Medical History:  Diagnosis Date  . Hypertension      History reviewed. No pertinent family history.  Social History:  reports that he quit smoking about 2 years ago. His smoking use included cigarettes. He smoked 0.25 packs per day. He has never used smokeless tobacco. He reports previous alcohol use. He reports that he does not use drugs.   Prior to Admission medications   Medication Sig Start Date End Date Taking? Authorizing Provider  amLODipine (NORVASC) 10 MG tablet Take 10 mg by mouth daily.   Yes [provider]  hydrochlorothiazide (HYDRODIURIL) 25 MG tablet Take 25 mg by mouth daily.   Yes [provider]  potassium chloride SA (KLOR-CON) 20 MEQ tablet Take 1 tablet (20 mEq total) by mouth 2 (two) times daily. 08/14/20   Benjiman Core, MD    Exam: Current vital signs: BP 138/81 (BP Location: Right Arm)   Pulse 80   Temp 98.5 F (36.9 C) (Oral)   Resp 12   Ht 5\' 9"  (1.753 m)   Wt 122.5 kg   SpO2 100%   BMI 39.87 kg/m   Physical Exam  Constitutional: Appears well-developed and well-nourished.  Eyes: No scleral injection HENT:Will not open mouth.  Head: Normocephalic.  Cardiovascular: Normal rate and regular rhythm.  Respiratory: Effort normal. GI: Soft.  No  distension. There is no tenderness.  Skin: WDI  Neuro: Mental Status: Patient mostly keeps his eyes closed and will not help hold his eyes open for pupil exam. He answers some questions with a whispered word and does not respond to others. He tells me his first name but answers no other questions.  No signs of aphasia or neglect Cranial Nerves: PERRL. Pt will not participate in other portions of exam.  Motor: Tone is normal. Bulk is normal. He withdraws to pain to the LEs and says "ouch". He will not follow any other commands or participate in exam.  Unable to perform rest of neuro exam due to his lack of participation.   I have reviewed labs in epic and the pertinent results are: Negative.   Primary Diagnosis:  Somulence/change in mental status-exam is not consistent with imaging or other w/up. Suspect secondary gain of possibly avoiding going back to jail. Possible malingering.   Assessment: Caleb Leblanc is a 59 y.o. male PMHx of HTN that neuro asked to see due to unresponsiveness/somulence/and inability to participate in exams. He was also found to be non verbal. He was verbal on exam but would not participate in the exam or follow commands. He withdrew to pain and said "ouch". He had no words to say other than his first name and ouch as above.   Impression: Altered mental status with inconsistency in exam with negative w/up. Suspect malingering  and secondary gain. No other neuro recommendations as of now. Please call with questions.   Electronically signed by: Jimmye Norman, MSN, APN-BC, nurse practitioner Pager: [phone] 08/18/2020, 6:36 PM

## 2020-08-18 NOTE — ED Notes (Signed)
Introduced self to pt. Pt did not respond but was blinking as RN entered room, seemingly responding to environmental stimuli. Dr. Rhona Leavens updated to patient's condition via secure chat. Two sheriff's deputies remain on guard at bedside. Pt has condom catheter applied.

## 2020-08-18 NOTE — Treatment Plan (Signed)
Pt transferred to 4th floor from ED this evening 1845. Pt still in bed with eyes open, but non-verbal, not following commands or responding to yes/no questions. Able to track movement in room with eyes. BLE cuffed to bed-x2 security guards at bedside. No s/s of pain. BL eye drainage noted.

## 2020-08-19 ENCOUNTER — Other Ambulatory Visit: Payer: Self-pay

## 2020-08-19 ENCOUNTER — Inpatient Hospital Stay (HOSPITAL_COMMUNITY)

## 2020-08-19 DIAGNOSIS — R4182 Altered mental status, unspecified: Secondary | ICD-10-CM

## 2020-08-19 LAB — CBC WITH DIFFERENTIAL/PLATELET
Abs Immature Granulocytes: 0.06 10*3/uL (ref 0.00–0.07)
Basophils Absolute: 0 10*3/uL (ref 0.0–0.1)
Basophils Relative: 0 %
Eosinophils Absolute: 0.1 10*3/uL (ref 0.0–0.5)
Eosinophils Relative: 2 %
HCT: 39.6 % (ref 39.0–52.0)
Hemoglobin: 12.6 g/dL — ABNORMAL LOW (ref 13.0–17.0)
Immature Granulocytes: 1 %
Lymphocytes Relative: 27 %
Lymphs Abs: 2.3 10*3/uL (ref 0.7–4.0)
MCH: 27.8 pg (ref 26.0–34.0)
MCHC: 31.8 g/dL (ref 30.0–36.0)
MCV: 87.4 fL (ref 80.0–100.0)
Monocytes Absolute: 0.8 10*3/uL (ref 0.1–1.0)
Monocytes Relative: 10 %
Neutro Abs: 5.2 10*3/uL (ref 1.7–7.7)
Neutrophils Relative %: 60 %
Platelets: 261 10*3/uL (ref 150–400)
RBC: 4.53 MIL/uL (ref 4.22–5.81)
RDW: 14.1 % (ref 11.5–15.5)
WBC: 8.5 10*3/uL (ref 4.0–10.5)
nRBC: 0 % (ref 0.0–0.2)

## 2020-08-19 LAB — POTASSIUM: Potassium: 3.4 mmol/L — ABNORMAL LOW (ref 3.5–5.1)

## 2020-08-19 LAB — PROCALCITONIN: Procalcitonin: <0.1 ng/mL

## 2020-08-19 LAB — LACTIC ACID, PLASMA: Lactic Acid, Venous: 0.9 mmol/L (ref 0.5–1.9)

## 2020-08-19 MED ORDER — POTASSIUM CHLORIDE CRYS ER 10 MEQ PO TBCR: 10.0000 meq | EXTENDED_RELEASE_TABLET | Freq: Every day | ORAL | 0 refills | Status: DC

## 2020-08-19 MED ORDER — POTASSIUM CHLORIDE CRYS ER 20 MEQ PO TBCR: 40.0000 meq | EXTENDED_RELEASE_TABLET | Freq: Two times a day (BID) | ORAL | Status: DC

## 2020-08-19 MED ORDER — POLYVINYL ALCOHOL 1.4 % OP SOLN
1.0000 [drp] | OPHTHALMIC | Status: DC | PRN
  Filled 2020-08-19: qty 15

## 2020-08-19 MED ORDER — POLYVINYL ALCOHOL 1.4 % OP SOLN: 1.0000 [drp] | OPHTHALMIC | 0 refills | Status: DC | PRN

## 2020-08-19 MED ORDER — POTASSIUM CHLORIDE CRYS ER 20 MEQ PO TBCR
40.0000 meq | EXTENDED_RELEASE_TABLET | Freq: Once | ORAL | Status: AC
  Administered 2020-08-19: 40 meq via ORAL
  Filled 2020-08-19: qty 2

## 2020-08-19 NOTE — Discharge Summary (Addendum)
Discharge Summary  Caleb Leblanc YYQ:[id number] DOB: 1960/11/08  PCP: Patient, No Pcp Per  Admit date: 08/15/2020 Discharge date: 08/19/2020  Time spent: 35 minutes  Recommendations for Outpatient Follow-up:  1. Follow-up with your PCP in 1-2 weeks 2. Take your medications as prescribed  Discharge Diagnoses:  Active Hospital Problems   Diagnosis Date Noted  . AMS (altered mental status) 08/16/2020  . Altered mental status 08/18/2020  . Encephalopathy 08/17/2020  . AKI (acute kidney injury) (HCC) 08/16/2020  . Hypokalemia 08/16/2020    Resolved Hospital Problems  No resolved problems to display.    Discharge Condition: Stable  Diet recommendation: Resume previous diet.  Vitals:   08/19/20 1054 08/19/20 1219  BP: 115/73 93/62  Pulse: (!) 106 99  Resp: 18 18  Temp: (!) 100.8 F (38.2 C) 99.4 F (37.4 C)  SpO2: 95% 94%    History of present illness:  Caleb Leblanc is a 59 yo AA male with PMH HTN who was brought to the ER via police escort from his detention center for a change in mental status. He first was brought to the ER on 11/5 for AMS and underwent workup.   His only obvious abnormality on workup on 08/14/20 was hypokalemia (2.6) but otherwise workup was unremarkable.  CT head showed no acute findings as well.  He received potassium supplementation and was discharged back to jail.  He was again sent back to the ER the evening of 08/15/2020 due to ongoing altered mental status and refusing to eat meals at the jail.  He was again brought back to the ER for further work-up.  Request for psych evaluation by the jail had been made as well. He was evaluated at 1 AM on 08/16/2020 and was not considered to meet inpatient psych criteria and was cleared for discharge back to the jail when medically stable.  He continued to be observed in the ER and was noted to be still confused but also refusing to engage in examinations and questions at times. He then underwent repeat CT head  on 08/15/2020 which showed no acute abnormalities (possibly a contusive change in the left parietal scalp?).  An MRI brain was then obtained which showed no acute infarct and mild white matter changes consistent with chronic microvascular ischemia.  Study was slightly motion degraded.  After MRI was complete and no obvious medical abnormalities have been found, psychiatry was requested to repeat assessment.  This was performed around 8:45 PM on 08/16/2020, however patient refused to partake in the exam and interview.  He was nonverbal and not answering questions.  There was no report of any seizure activity at the jail prior to his presentations and he has no prior history of seizures.  Initial screens of Tylenol level, salicylate, and ethanol were negative as well as UDS.  T-max was 99.6 on assessment.  There was no obvious concern for neck stiffness/pain/back pain. During different ER provider evaluations he was cooperative at times but only partially with exams and other times uncooperative. Due to concern for underlying ongoing AMS, decision was made to monitor patient further. Of note, UDS, ammonia, ABG were found to be neg. COVID neg. Seen by neurology, suspected secondary gain, possible malingering. No other neuro recommendations.  Temperature 100.8 on 08/19/2020, CBC with differential was unremarkable, chest x-ray, procalcitonin and lactic acid unremarkable.  CXR unremarkable.  Incentive spirometer ordered for possible atelectasis.  08/19/2020: Reports shoulder pain bilaterally, worse when raising his arms. Advised to alternate between Tylenol and ibuprofen  and to avoid heavy lifting or repetitive movements.  Hospital Course:  Principal Problem:   AMS (altered mental status) Active Problems:   AKI (acute kidney injury) (HCC)   Hypokalemia   Encephalopathy   Altered mental status  1. Acute metabolic encephalopathy, unclear etiology 1. Communicating today and answering to questions  appropriately. 2. CT head and MRI brain reviewed, found to be unremarkable 3. Renal function unremarkable 4. Ammonia, ABG, UDS unremarkable 5. EEG unremarkable. No seizure activity 6. Initially discussed case with ID who recommended empiric acyclovir for now while work up is in progress to cover ?encephalitis.  Ok to discontinue IV acyclovir per neuro. 7. Discussed with Neurology who has seen pt in consultation 2. Resolved AKI 1. Cr noted to be as high as 1.23 2. Cr back to baseline with hydration, Cr. 0.76 with GFR>60 3. Hypokalemia 1. Mildly low at 3.4 2. KCL x 2 doses then 10 meq daily x 5 days 3. Follow up with your PCP 4. Obesity 1. Recommend diet/lifestyle modification  Bilateral Shoulder pain, suspect 2/2 to overuse  Worse when passively raising his arms. Advised supportive care, to alternate between Tylenol and ibuprofen and to avoid heavy lifting or repetitive movements. Follow up with your PCP  Fever, likely 2/2 to atelectasis Tmax 100.8 BP 93/62 (MAP 71), HR 99, RR 18 O2 94% RA CXR reviewed personally, no active disease Continue incentive spirometer Mobilize as tolerated    Code Status: Full    Consultants:   Neurology  Psychiatry  Procedures:   None  Antimicrobials: Received IV acyclovir 08/17/20>08/19/20-Discussed with neurology Dr. Marisue Humble, ok to discontinue acyclovir.  Discharge Exam: BP 93/62 (BP Location: Right Arm)   Pulse 99   Temp 99.4 F (37.4 C) (Oral)   Resp 18   Ht 5\' 9"  (1.753 m)   Wt 122.5 kg   SpO2 94%   BMI 39.87 kg/m  . General: 59 y.o. year-old male well developed well nourished in no acute distress.  Alert and oriented x3. . Cardiovascular: Regular rate and rhythm with no rubs or gallops.  No thyromegaly or JVD noted.   46 Respiratory: Clear to auscultation with no wheezes or rales. Good inspiratory effort. . Abdomen: Soft nontender nondistended with normal bowel sounds x4 quadrants. . Musculoskeletal: No lower  extremity edema. 2/4 pulses in all 4 extremities. Marland Kitchen Psychiatry: Mood is appropriate for condition and setting  Discharge Instructions You were cared for by a hospitalist during your hospital stay. If you have any questions about your discharge medications or the care you received while you were in the hospital after you are discharged, you can call the unit and asked to speak with the hospitalist on call if the hospitalist that took care of you is not available. Once you are discharged, your primary care physician will handle any further medical issues. Please note that NO REFILLS for any discharge medications will be authorized once you are discharged, as it is imperative that you return to your primary care physician (or establish a relationship with a primary care physician if you do not have one) for your aftercare needs so that they can reassess your need for medications and monitor your lab values.   Allergies as of 08/19/2020      Reactions   Amoxicillin Anaphylaxis   Augmentin [amoxicillin-pot Clavulanate] Anaphylaxis      Medication List    STOP taking these medications   hydrochlorothiazide 25 MG tablet Commonly known as: HYDRODIURIL     TAKE these  medications   amLODipine 10 MG tablet Commonly known as: NORVASC Take 10 mg by mouth daily.   polyvinyl alcohol 1.4 % ophthalmic solution Commonly known as: LIQUIFILM TEARS Place 1 drop into both eyes as needed for dry eyes.   potassium chloride 10 MEQ tablet Commonly known as: KLOR-CON Take 1 tablet (10 mEq total) by mouth daily for 5 days. What changed:   medication strength  how much to take  when to take this      Allergies  Allergen Reactions  . Amoxicillin Anaphylaxis  . Augmentin [Amoxicillin-Pot Clavulanate] Anaphylaxis      The results of significant diagnostics from this hospitalization (including imaging, microbiology, ancillary and laboratory) are listed below for reference.    Significant  Diagnostic Studies: CT Head Wo Contrast  Result Date: 08/15/2020 CLINICAL DATA:  Possible stroke EXAM: CT HEAD WITHOUT CONTRAST TECHNIQUE: Contiguous axial images were obtained from the base of the skull through the vertex without intravenous contrast. COMPARISON:  CT 08/14/2020 FINDINGS: Brain: No evidence of acute infarction, hemorrhage, hydrocephalus, extra-axial collection, visible mass lesion or mass effect. Stable benign dural calcifications. Vascular: No hyperdense vessel or unexpected calcification. Skull: There is some questionable thickening and possible contusive change the left parietal scalp without subjacent calvarial fracture. No other acute osseous or soft tissue abnormality of skull scalp tissues. Sinuses/Orbits: Paranasal sinuses and mastoid air cells are predominantly clear. Included orbital structures are unremarkable. Other: None. IMPRESSION: 1. No acute intracranial abnormality. 2. Thickening, possibly contusive change, of the left parietal scalp without subjacent calvarial fracture. Correlate for point tenderness. Electronically Signed   By: Kreg Shropshire M.D.   On: 08/15/2020 23:07   CT Head Wo Contrast  Result Date: 08/14/2020 CLINICAL DATA:  Mental status change EXAM: CT HEAD WITHOUT CONTRAST TECHNIQUE: Contiguous axial images were obtained from the base of the skull through the vertex without intravenous contrast. COMPARISON:  None. FINDINGS: Brain: No evidence of acute infarction, hemorrhage, hydrocephalus, extra-axial collection or mass lesion/mass effect. Vascular: Negative for hyperdense vessel Skull: Negative Sinuses/Orbits: Paranasal sinuses clear.  Negative orbit Other: None IMPRESSION: Negative CT head Electronically Signed   By: Marlan Palau M.D.   On: 08/14/2020 19:39   MR BRAIN WO CONTRAST  Result Date: 08/16/2020 CLINICAL DATA:  Mental status change EXAM: MRI HEAD WITHOUT CONTRAST TECHNIQUE: Multiplanar, multiecho pulse sequences of the brain and surrounding  structures were obtained without intravenous contrast. COMPARISON:  CT head 08/15/2020 FINDINGS: Brain: Limited study. Images degraded by motion. Patient not able to complete all sequences. Negative for acute infarct. Mild white matter changes consistent with chronic microvascular ischemia. Negative for hemorrhage or mass. Ventricle size normal. Vascular: Normal arterial flow voids. Skull and upper cervical spine: Negative Sinuses/Orbits: Negative Other: None IMPRESSION: Limited study, degraded by motion and incomplete. Negative for acute infarct. Mild chronic microvascular ischemic change in the white matter. Electronically Signed   By: Marlan Palau M.D.   On: 08/16/2020 15:17   DG CHEST PORT 1 VIEW  Result Date: 08/19/2020 CLINICAL DATA:  Fever. EXAM: PORTABLE CHEST 1 VIEW COMPARISON:  Chest x-ray dated August 16, 2020. FINDINGS: The heart size and mediastinal contours are within normal limits. Both lungs are clear. The visualized skeletal structures are unremarkable. IMPRESSION: No active disease. Electronically Signed   By: Obie Dredge M.D.   On: 08/19/2020 12:36   DG Chest Port 1 View  Result Date: 08/16/2020 CLINICAL DATA:  Fever. EXAM: PORTABLE CHEST 1 VIEW COMPARISON:  August 18, 2019 FINDINGS: The heart size and  mediastinal contours are within normal limits. Both lungs are clear. The visualized skeletal structures are unremarkable. IMPRESSION: No active disease. Electronically Signed   By: Katherine Mantlehristopher  Green M.D.   On: 08/16/2020 02:10   DG Abd Portable 1V  Result Date: 08/16/2020 CLINICAL DATA:  [age] year old male under evaluation prior to MRI for pre MRI screening. EXAM: PORTABLE ABDOMEN - 1 VIEW COMPARISON:  05/03/2010. FINDINGS: Gas and stool are seen scattered throughout the colon extending to the level of the distal rectum. No pathologic distension of small bowel is noted. No gross evidence of pneumoperitoneum. Surgical clips project over the right upper quadrant of the abdomen,  likely from prior cholecystectomy. IMPRESSION: 1. Status post cholecystectomy. 2. No other unexpected metallic foreign bodies in the abdomen or pelvis. 3. Nonobstructive bowel gas pattern. Electronically Signed   By: Trudie Reedaniel  Entrikin M.D.   On: 08/16/2020 13:14   EEG adult  Result Date: 08/17/2020 Charlsie QuestYadav, Priyanka O, MD     08/17/2020  5:10 PM Patient Name: Caleb BetterJoseph J Leblanc MRN: [id number] Epilepsy Attending: Charlsie QuestPriyanka O Yadav Referring Physician/Provider: Dr Rickey BarbaraStephen Chiu Date: 08/17/2020 Duration: 25.09 mins Patient history: 59yo M with ams. EEG to evaluate for seizure. Level of alertness: Awake AEDs during EEG study: None Technical aspects: This EEG study was done with scalp electrodes positioned according to the 10-20 International system of electrode placement. Electrical activity was acquired at a sampling rate of 500Hz  and reviewed with a high frequency filter of 70Hz  and a low frequency filter of 1Hz . EEG data were recorded continuously and digitally stored. Description: The posterior dominant rhythm consists of 9-10 Hz activity of moderate voltage (25-35 uV) seen predominantly in posterior head regions, symmetric and reactive to eye opening and eye closing. Hyperventilation and photic stimulation were not performed.   IMPRESSION: This study is within normal limits. No seizures or epileptiform discharges were seen throughout the recording. Charlsie QuestPriyanka O Yadav    Microbiology: Recent Results (from the past 240 hour(s))  Urine culture     Status: None   Collection Time: 08/15/20  9:49 PM   Specimen: Urine, Clean Catch  Result Value Ref Range Status   Specimen Description   Final    URINE, CLEAN CATCH Performed at [facility], [address] [place], [place] [address]    Special Requests   Final    NONE Performed at [facility], [address] [place], [place] [address]    Culture   Final    NO GROWTH Performed at [facility], [address]  [place], [place] [address]    Report Status 08/17/2020 FINAL  Final  Respiratory Panel by RT PCR (Flu A&B, Covid) - Nasopharyngeal Swab     Status: None   Collection Time: 08/15/20 11:47 PM   Specimen: Nasopharyngeal Swab  Result Value Ref Range Status   SARS Coronavirus 2 by RT PCR NEGATIVE NEGATIVE Final    Comment: (NOTE) SARS-CoV-2 target nucleic acids are NOT DETECTED.  The SARS-CoV-2 RNA is generally detectable in upper respiratoy specimens during the acute phase of infection. The lowest concentration of SARS-CoV-2 viral copies this assay can detect is 131 copies/mL. A negative result does not preclude SARS-Cov-2 infection and should not be used as the sole basis for treatment or other patient management decisions. A negative result may occur with  improper specimen collection/handling, submission of specimen other than nasopharyngeal swab, presence of viral mutation(s) within the areas targeted by this assay, and inadequate number of viral copies (<131  copies/mL). A negative result must be combined with clinical observations, patient history, and epidemiological information. The expected result is Negative.  Fact Sheet for Patients:  https://www.moore.com/  Fact Sheet for Healthcare Providers:  https://www.young.biz/  This test is no t yet approved or cleared by the [place] FDA and  has been authorized for detection and/or diagnosis of SARS-CoV-2 by FDA under an Emergency Use Authorization (EUA). This EUA will remain  in effect (meaning this test can be used) for the duration of the COVID-19 declaration under Section 564(b)(1) of the Act, 21 U.S.C. section 360bbb-3(b)(1), unless the authorization is terminated or revoked sooner.     Influenza A by PCR NEGATIVE NEGATIVE Final   Influenza B by PCR NEGATIVE NEGATIVE Final    Comment: (NOTE) The Xpert Xpress SARS-CoV-2/FLU/RSV assay is intended as an aid in  the diagnosis of  influenza from Nasopharyngeal swab specimens and  should not be used as a sole basis for treatment. Nasal washings and  aspirates are unacceptable for Xpert Xpress SARS-CoV-2/FLU/RSV  testing.  Fact Sheet for Patients: https://www.moore.com/  Fact Sheet for Healthcare Providers: https://www.young.biz/  This test is not yet approved or cleared by the [place] FDA and  has been authorized for detection and/or diagnosis of SARS-CoV-2 by  FDA under an Emergency Use Authorization (EUA). This EUA will remain  in effect (meaning this test can be used) for the duration of the  Covid-19 declaration under Section 564(b)(1) of the Act, 21  U.S.C. section 360bbb-3(b)(1), unless the authorization is  terminated or revoked. Performed at [facility], [address], [place], [place] [address]   Culture, blood (routine x 2)     Status: None (Preliminary result)   Collection Time: 08/16/20  1:13 AM   Specimen: BLOOD  Result Value Ref Range Status   Specimen Description   Final    BLOOD RIGHT ANTECUBITAL Performed at [facility], [address], [place], [place] [address]    Special Requests   Final    BOTTLES DRAWN AEROBIC AND ANAEROBIC Blood Culture adequate volume Performed at [facility], [address], [place], [place] [address]    Culture   Final    NO GROWTH 3 DAYS Performed at [facility] [place] [facility], [address], [place], [place] [address]    Report Status PENDING  Incomplete     Labs: Basic Metabolic Panel: Recent Labs  Lab 08/14/20 1820 08/15/20 2148 08/17/20 0500 08/18/20 0548 08/19/20 1009  NA 136 138 141 142  --   K 2.6* 3.3* 3.4* 3.2* 3.4*  CL 95* 100 104 108  --   CO2 --   GLUCOSE 114* 131* 74 70  --   BUN 14 26* 19 14  --   CREATININE 0.87 1.23 0.67 0.76  --   CALCIUM 8.8* 9.2 8.8* 8.4*  --   MG 2.4 2.4 2.6* 2.4  --    Liver Function  Tests: Recent Labs  Lab 08/14/20 1820 08/15/20 2148 08/17/20 0500 08/18/20 0548  AST ALT ALKPHOS 43 43 40 34*  BILITOT 0.6 1.2 1.1 1.0  PROT 8.6* 8.5* 8.0 7.0  ALBUMIN 4.0 4.2 3.8 3.2*   No results for input(s): LIPASE, AMYLASE in the last 168 hours. Recent Labs  Lab 08/17/20 1232  AMMONIA 18   CBC: Recent Labs  Lab 08/14/20 1820 08/15/20 2148 08/17/20 0500 08/18/20 0548 08/19/20 1009  WBC 9.6 9.5 9.5 7.5 8.5  NEUTROABS 5.6 5.9 5.8  --  5.2  HGB 15.1 14.0 13.9 12.6* 12.6*  HCT 44.8 43.6 44.5 39.8 39.6  MCV 83.9 85.0 87.3 86.5 87.4  PLT 337 315 303 258 261   Cardiac Enzymes: No results for input(s): CKTOTAL, CKMB, CKMBINDEX, TROPONINI in the last 168 hours. BNP: BNP (last 3 results) No results for input(s): BNP in the last 8760 hours.  ProBNP (last 3 results) No results for input(s): PROBNP in the last 8760 hours.  CBG: Recent Labs  Lab 08/14/20 1721  GLUCAP 108*       Signed:  Darlin Drop, MD [facility] 08/19/2020, 1:52 PM

## 2020-08-19 NOTE — TOC Transition Note (Signed)
Transition of Care [facility]) - CM/SW Discharge Note   Patient Details  Name: Caleb Leblanc MRN: [id number] Date of Birth: [birth year]  [facility] CM/SW Contact:  Lanier Clam, RN Phone Number: 08/19/2020, 11:28 AM   Clinical Narrative:  [facility] to [facility] w[facility] offers. No CM needs.     Final next level of care: [facility] Facility Barriers to Discharge: No Barriers Identified   Patient Goals and CMS Choice Patient states their goals for this hospitalization and ongoing recovery are:: return back to [facility].gov Compare Post Acute Care list provided to:: Patient Choice offered to / list presented to : Patient  Discharge Placement                       Discharge Plan and Services   Discharge Planning Services: CM Consult Post Acute Care Choice: Resumption of Svcs/PTA Provider                               Social Determinants of Health (SDOH) Interventions     Readmission Risk Interventions No flowsheet data found.

## 2020-08-19 NOTE — Progress Notes (Addendum)
Patient has been discharged. Instructions were reviewed. Questions, concerns denied at this time. Patient remains flat and weak. No change from am assessment pt remains weak.Guards to assist with discharge and transport.

## 2020-08-19 NOTE — Plan of Care (Signed)

## 2020-08-19 NOTE — Consult Note (Signed)
  Jayven Naill is a 59 year old male who presented to [facility] emergency room with altered mental status.  Psych consult was placed for altered mental status.  Upon evaluation patient is alert and oriented, calm and cooperative, and shows willingness to participate in evaluation.  He is observed to be sitting upright eating his breakfast.  Writer introduced self to patient and informed him of my reason for coming, he states he is doing much better now.  He does endorse history of depression, that is ongoing secondary to his poor choices that he has made i.e. jail.  He denies any other previous psychiatric history, denies any previous psychiatric medication, substance use, suicide attempts, and or psychosis.  He does report when he is in his jail cell he can sometimes hear things, however denies any hallucinations or psychosis during this hospital visit.  He is advised if he develops psychosis while in jail to inform someone that he can be evaluated. He does not appear to be responding to internal stimuli.  Patient no longer appears to have altered mental status as he is verbal, completing ADLs independently, and answers all questions appropriately.  Currently does not meet inpatient criteria, and was psychiatrically cleared patient at this time.  -Psychiatry to sign off at this time.

## 2020-08-19 NOTE — Progress Notes (Signed)
Incentive spirometer education provided. Patient verbalized understanding.

## 2020-08-21 LAB — CULTURE, BLOOD (ROUTINE X 2)
Culture: NO GROWTH
Special Requests: ADEQUATE

## 2020-08-21 LAB — VITAMIN B1: Vitamin B1 (Thiamine): 121.2 nmol/L (ref 66.5–200.0)

## 2022-01-11 IMAGING — DX DG CHEST 1V PORT
1 series · 1 of 1 positions shown · non-contrast
Comparison: August 18, 2019

CLINICAL DATA: Fever.

EXAM:
PORTABLE CHEST 1 VIEW

[chest ap]
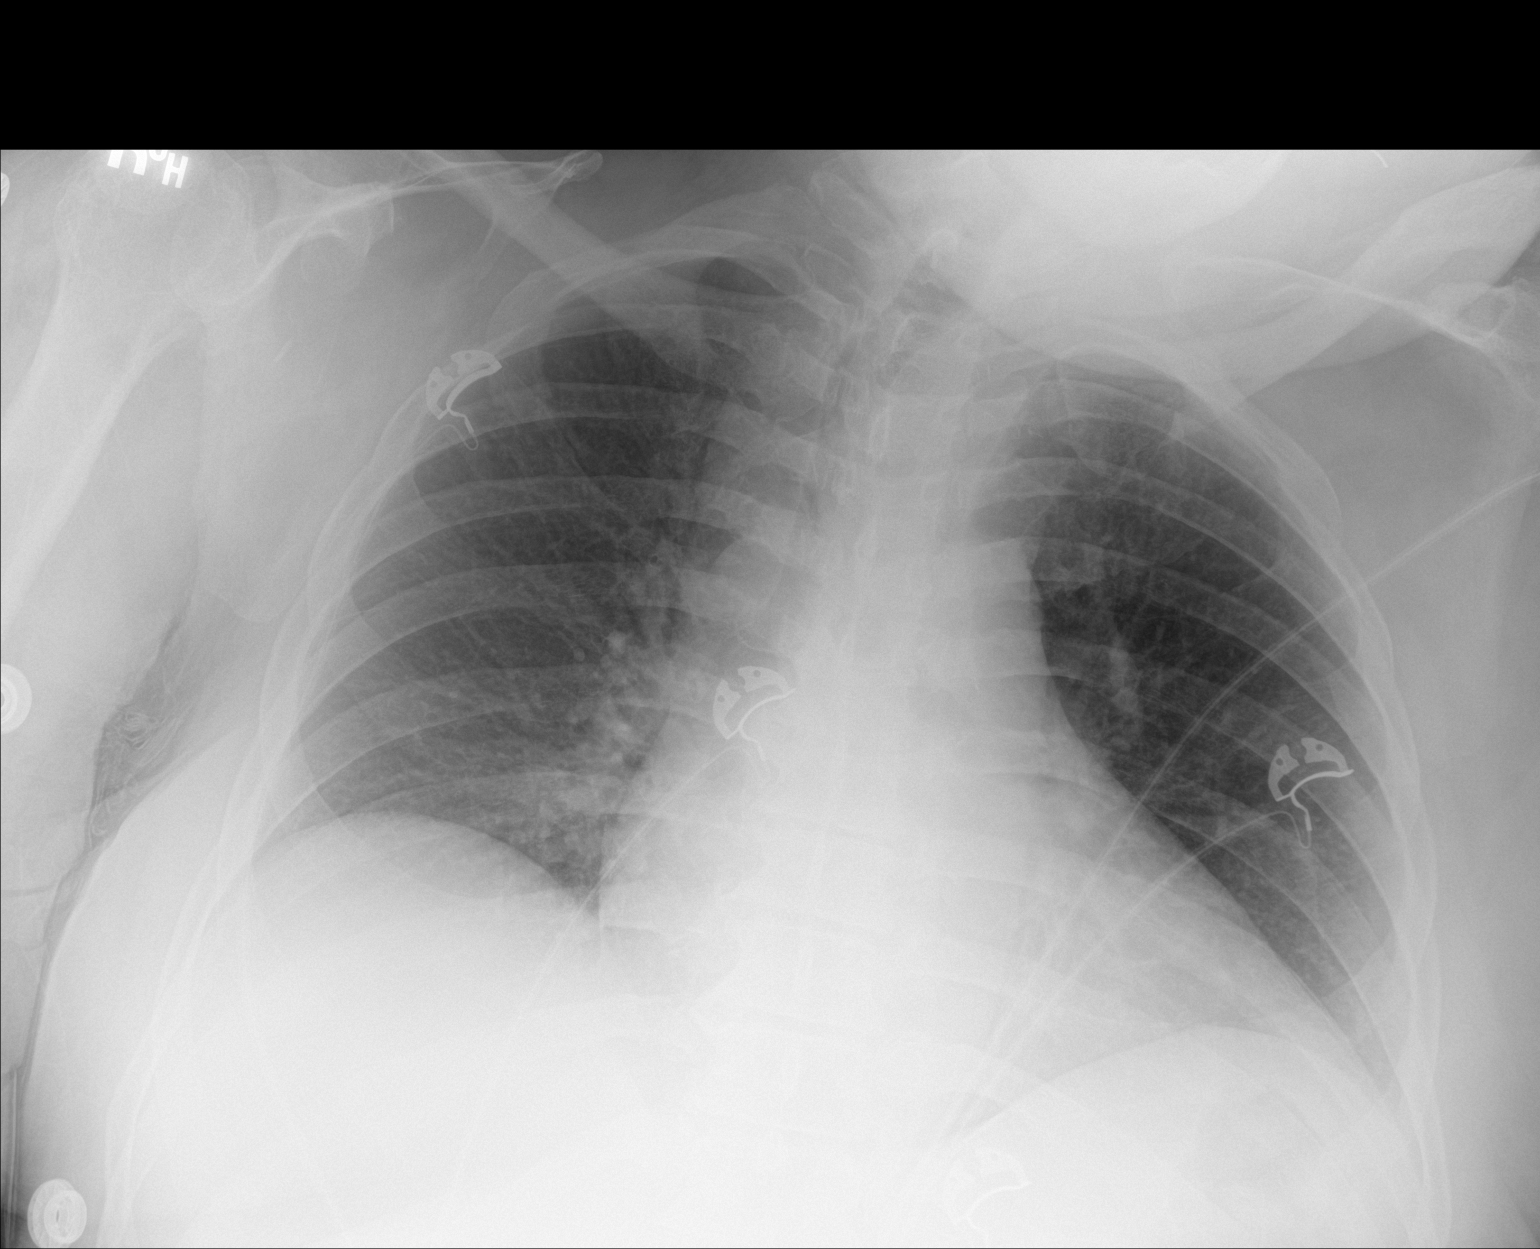

[1 of 1 positions shown; findings below may reference images not displayed]

FINDINGS: The heart size and mediastinal contours are within normal limits.
Both lungs are clear. The visualized skeletal structures are
unremarkable.
IMPRESSION: No active disease.

## 2022-01-11 IMAGING — DX DG ABD PORTABLE 1V
2 series · 2 of 2 positions shown · non-contrast
Comparison: 05/03/2010.

CLINICAL DATA: 59-year-old male under evaluation prior to MRI for
pre MRI screening.

EXAM:
PORTABLE ABDOMEN - 1 VIEW

[abdomen kub (1 of 2)]
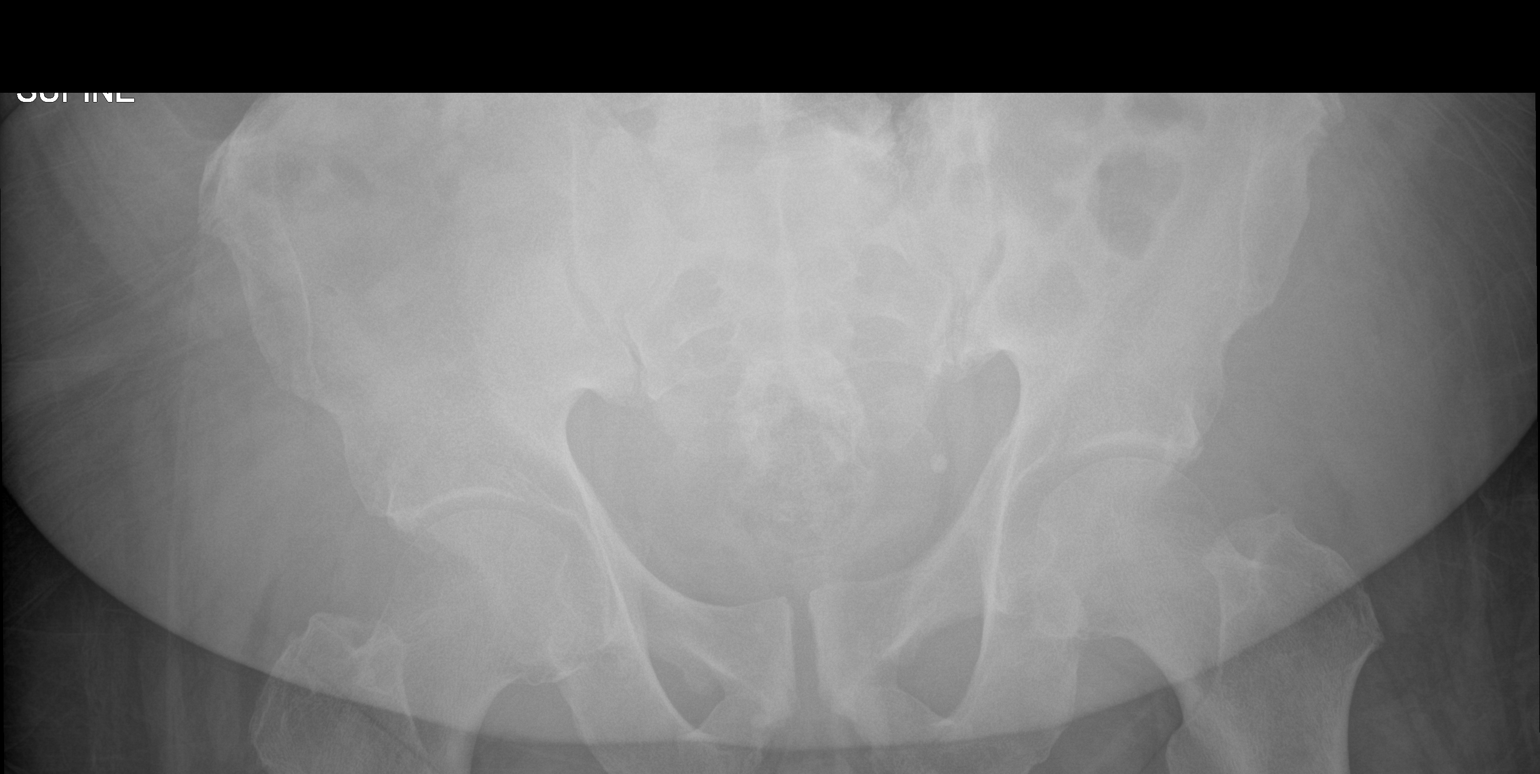

[abdomen kub (2 of 2)]
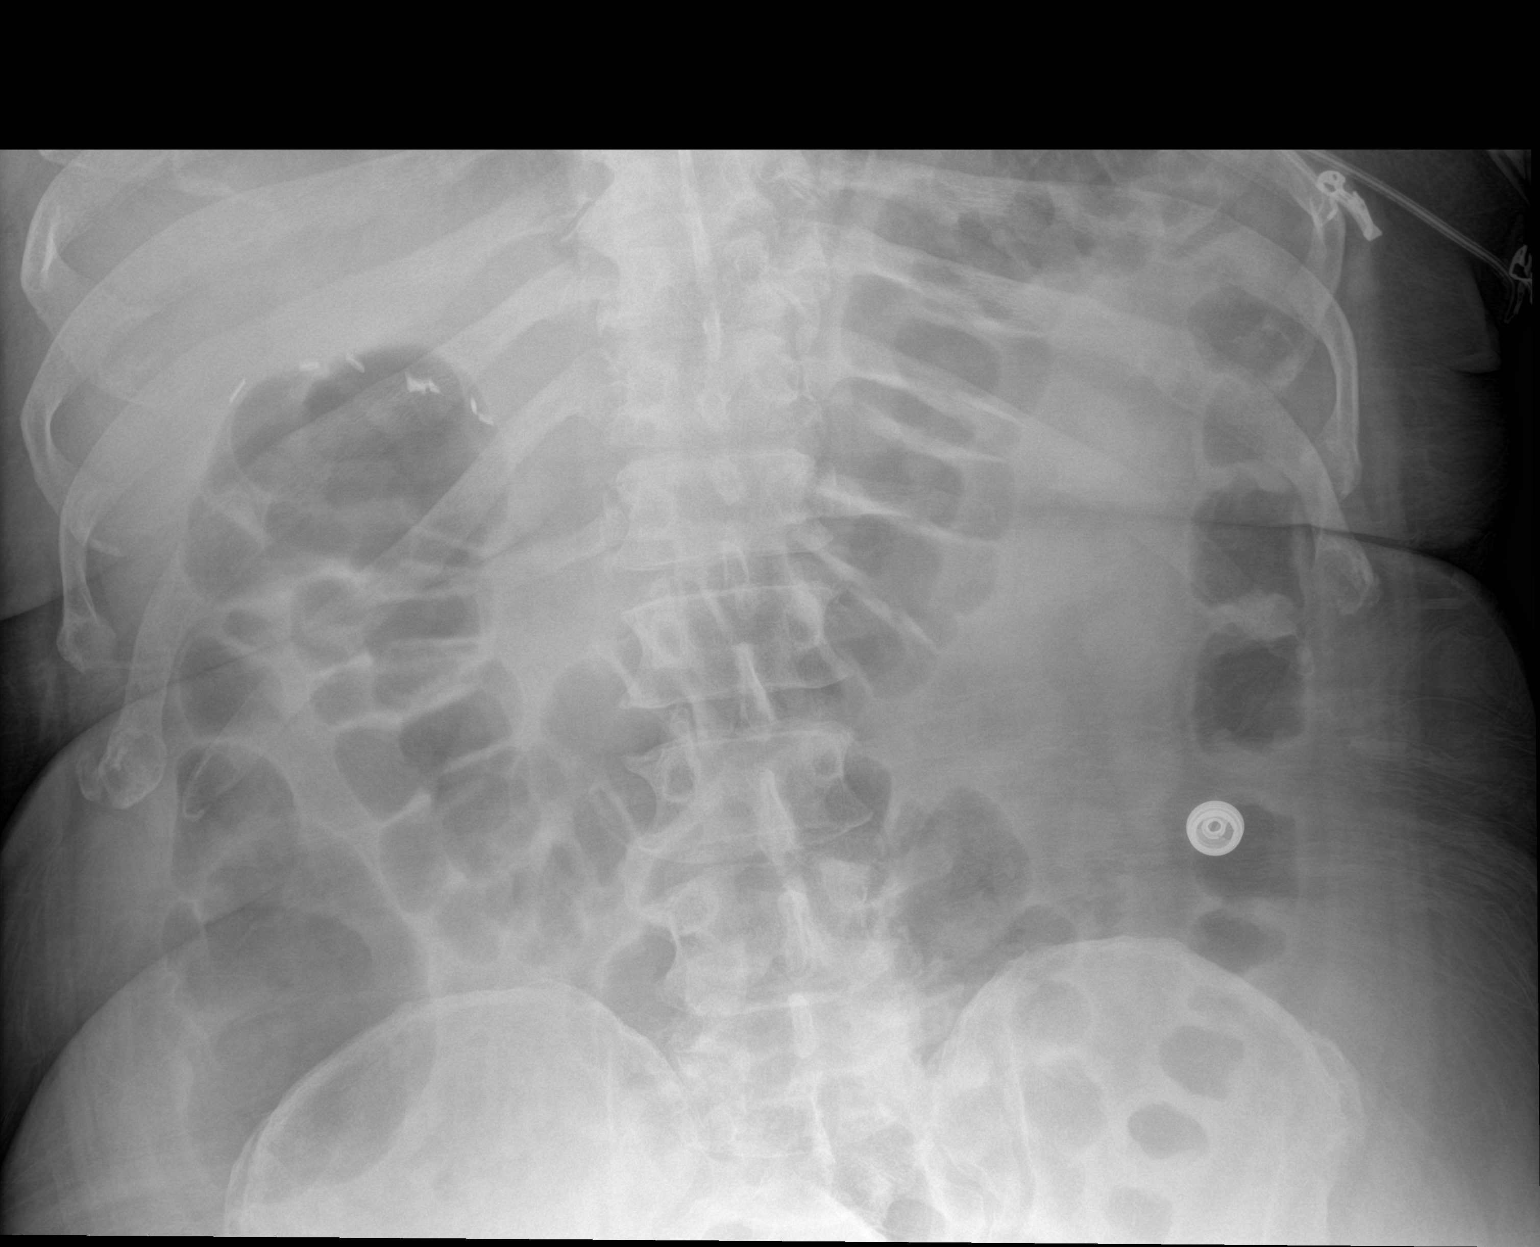

[2 of 2 positions shown; findings below may reference images not displayed]

FINDINGS: Gas and stool are seen scattered throughout the colon extending to
the level of the distal rectum. No pathologic distension of small
bowel is noted. No gross evidence of pneumoperitoneum. Surgical
clips project over the right upper quadrant of the abdomen, likely
from prior cholecystectomy.
IMPRESSION: 1. Status post cholecystectomy.
2. No other unexpected metallic foreign bodies in the abdomen or
pelvis.
3. Nonobstructive bowel gas pattern.

## 2022-01-11 IMAGING — MR MR HEAD W/O CM
6 series · 48 of 48 positions shown · non-contrast
Comparison: CT head 08/15/2020

CLINICAL DATA: Mental status change

EXAM:
MRI HEAD WITHOUT CONTRAST
TECHNIQUE: Multiplanar, multiecho pulse sequences of the brain and surrounding
structures were obtained without intravenous contrast.

[Series 5: dwi_tracew · axial · 3.0mm · 0.92mm/px · z∈[-26,+126]mm · 16 of 110 slices shown]
[im 1/110]
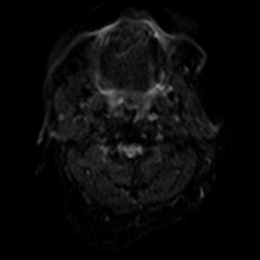
[im 8/110]
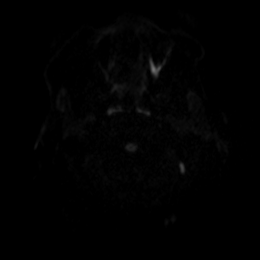
[im 15/110]
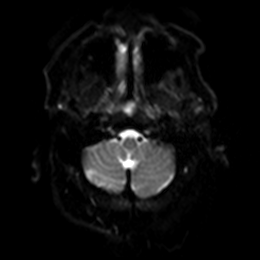
[im 22/110]
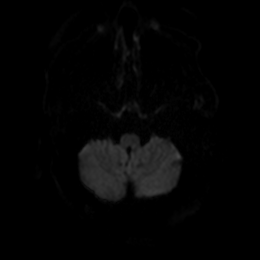
[im 30/110]
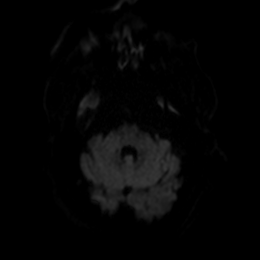
[im 37/110]
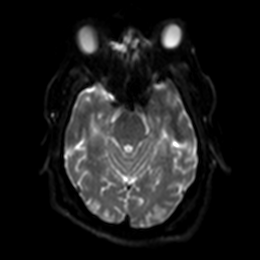
[im 44/110]
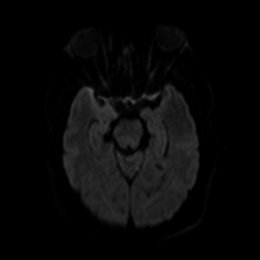
[im 51/110]
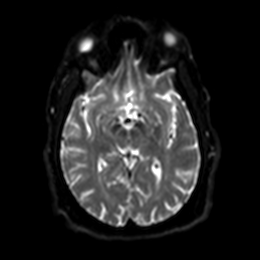
[im 59/110]
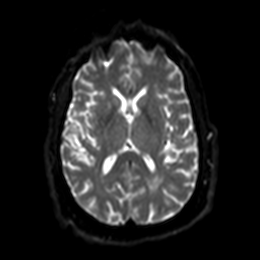
[im 66/110]
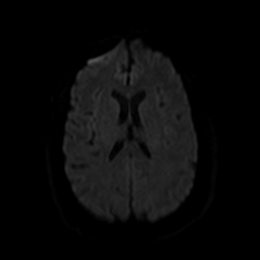
[im 73/110]
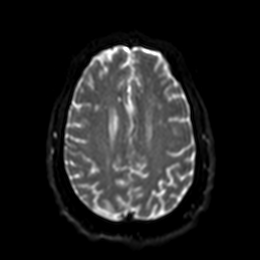
[im 80/110]
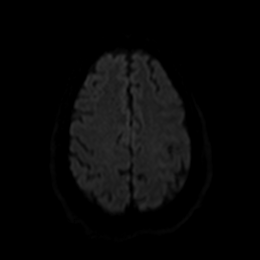
[im 88/110]
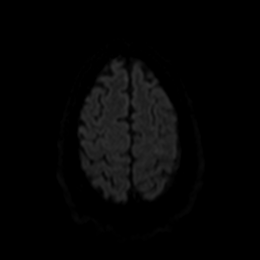
[im 95/110]
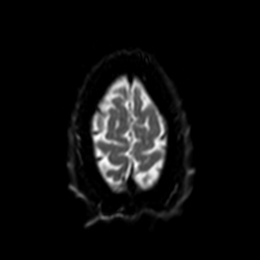
[im 102/110]
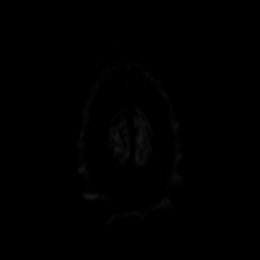
[im 110/110]
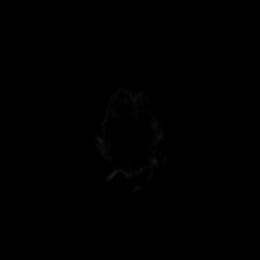

[Series 6: dwi_adc · axial · 3.0mm · 0.92mm/px · z∈[-26,+126]mm · 8 of 55 slices shown]
[im 1/55]
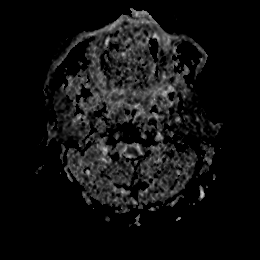
[im 8/55]
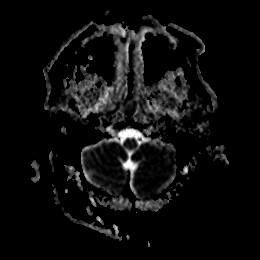
[im 16/55]
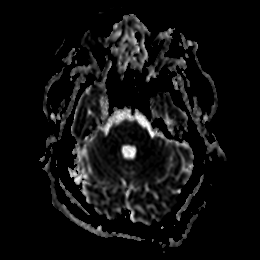
[im 24/55]
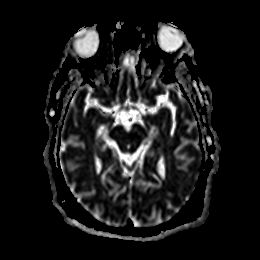
[im 31/55]
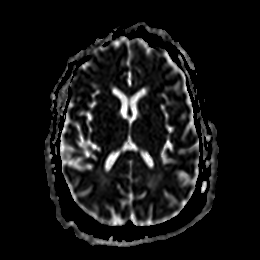
[im 39/55]
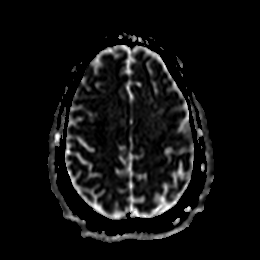
[im 47/55]
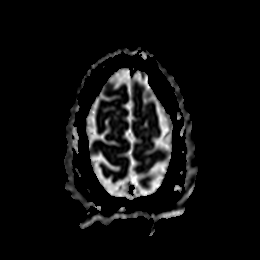
[im 55/55]
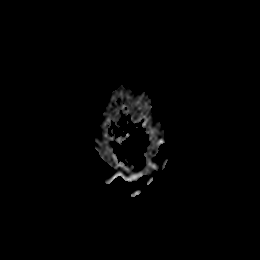

[Series 7: FLAIR · axial · 4.0mm · 0.90mm/px · z∈[-39,+119]mm · 6 of 43 slices shown]
[im 1/43]
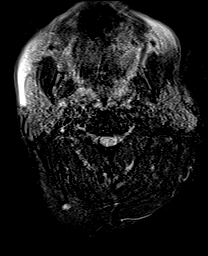
[im 9/43]
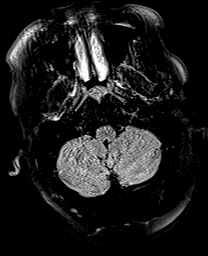
[im 17/43]
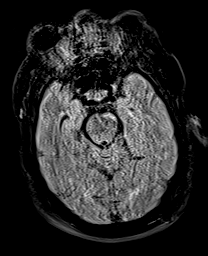
[im 26/43]
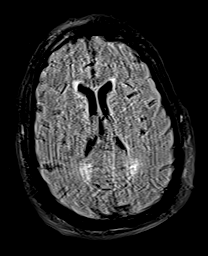
[im 34/43]
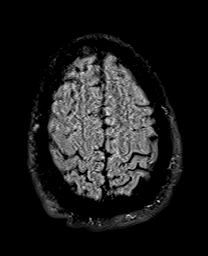
[im 43/43]
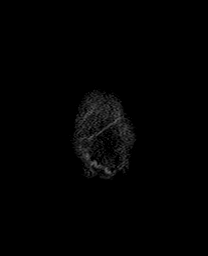

[Series 8: GRE · axial · 4.0mm · 0.45mm/px · z∈[-31,+122]mm · 6 of 42 slices shown]
[im 1/42]
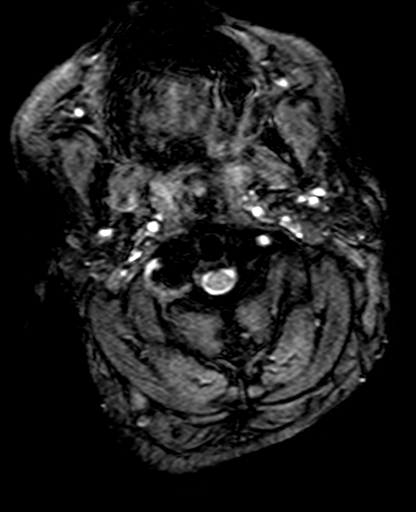
[im 9/42]
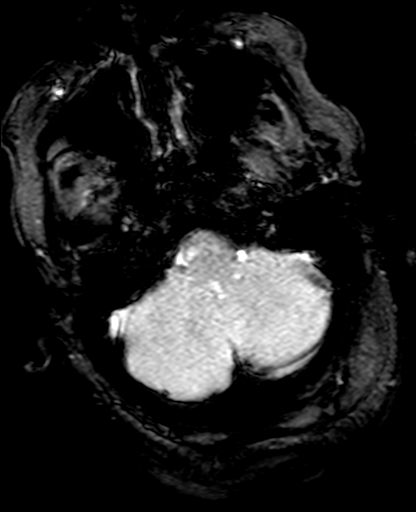
[im 17/42]
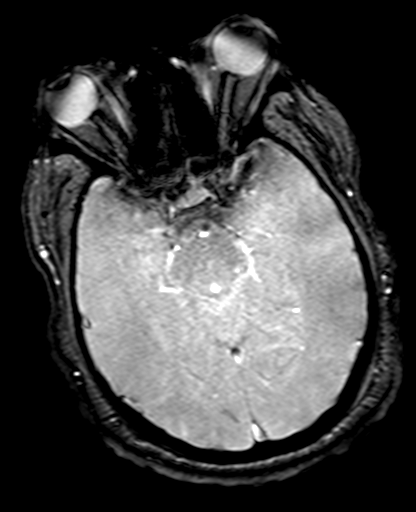
[im 25/42]
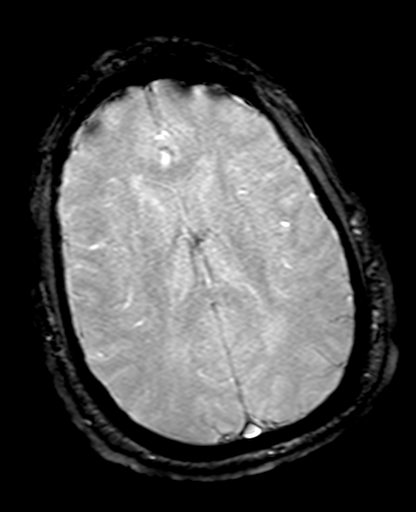
[im 33/42]
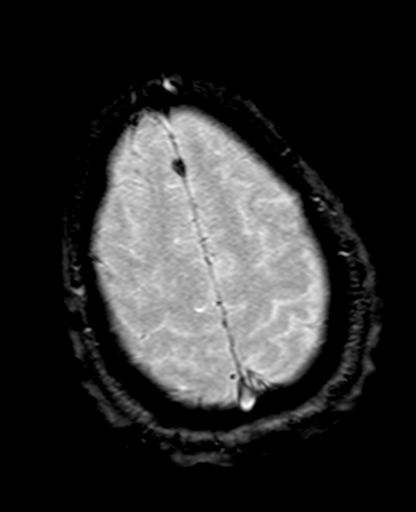
[im 42/42]
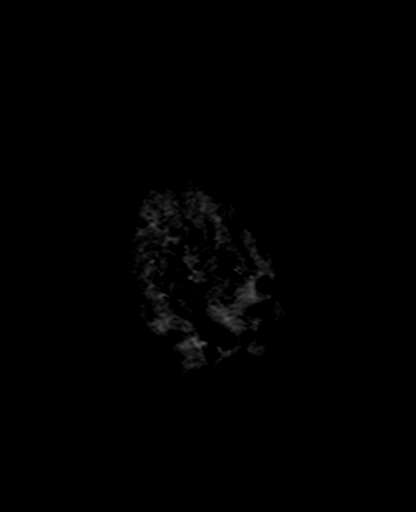

[Series 9: DWI · coronal · 5.0mm · 1.31mm/px · 8 of 60 slices shown (1 of 2)]
[im 1/60]
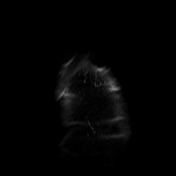
[im 9/60]
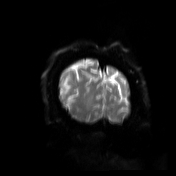
[im 17/60]
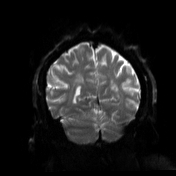
[im 26/60]
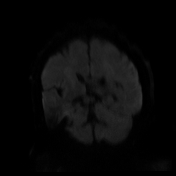
[im 34/60]
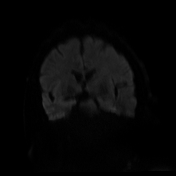
[im 43/60]
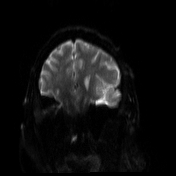
[im 51/60]
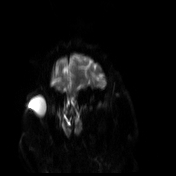
[im 60/60]
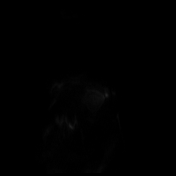

[Series 10: DWI · coronal · 5.0mm · 1.31mm/px · 4 of 30 slices shown (2 of 2)]
[im 1/30]
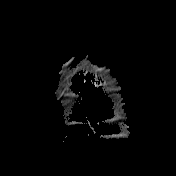
[im 10/30]
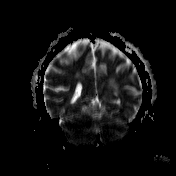
[im 20/30]
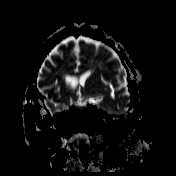
[im 30/30]
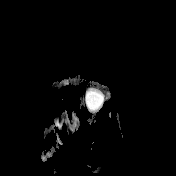

[48 of 48 positions shown; findings below may reference images not displayed]

FINDINGS: Brain: Limited study. Images degraded by motion. Patient not able to
complete all sequences.

Negative for acute infarct. Mild white matter changes consistent
with chronic microvascular ischemia. Negative for hemorrhage or
mass. Ventricle size normal.

Vascular: Normal arterial flow voids.

Skull and upper cervical spine: Negative

Sinuses/Orbits: Negative

Other: None
IMPRESSION: Limited study, degraded by motion and incomplete.

Negative for acute infarct. Mild chronic microvascular ischemic
change in the white matter.

## 2024-01-18 ENCOUNTER — Encounter (HOSPITAL_COMMUNITY): Payer: Self-pay | Admitting: Internal Medicine

## 2024-01-18 ENCOUNTER — Inpatient Hospital Stay (HOSPITAL_COMMUNITY)
Admission: EM | Admit: 2024-01-18 | Discharge: 2024-01-26 | DRG: 071 | Disposition: A | Discharge status: Home / Self Care | Payer: Self-pay | Attending: Internal Medicine | Admitting: Internal Medicine

## 2024-01-18 ENCOUNTER — Encounter (HOSPITAL_COMMUNITY): Payer: Self-pay | Admitting: Psychiatry

## 2024-01-18 ENCOUNTER — Ambulatory Visit (HOSPITAL_COMMUNITY)
Admission: EM | Admit: 2024-01-18 | Discharge: 2024-01-18 | Disposition: A | Discharge status: Home / Self Care | Attending: Family | Admitting: Family

## 2024-01-18 ENCOUNTER — Other Ambulatory Visit: Payer: Self-pay

## 2024-01-18 ENCOUNTER — Emergency Department (HOSPITAL_COMMUNITY): Payer: Self-pay

## 2024-01-18 DIAGNOSIS — G3184 Mild cognitive impairment, so stated: Secondary | ICD-10-CM | POA: Diagnosis present

## 2024-01-18 DIAGNOSIS — Z652 Problems related to release from prison: Secondary | ICD-10-CM | POA: Insufficient documentation

## 2024-01-18 DIAGNOSIS — F061 Catatonic disorder due to known physiological condition: Secondary | ICD-10-CM | POA: Diagnosis present

## 2024-01-18 DIAGNOSIS — R32 Unspecified urinary incontinence: Secondary | ICD-10-CM | POA: Diagnosis present

## 2024-01-18 DIAGNOSIS — E538 Deficiency of other specified B group vitamins: Secondary | ICD-10-CM | POA: Diagnosis present

## 2024-01-18 DIAGNOSIS — Z79899 Other long term (current) drug therapy: Secondary | ICD-10-CM

## 2024-01-18 DIAGNOSIS — Z87891 Personal history of nicotine dependence: Secondary | ICD-10-CM

## 2024-01-18 DIAGNOSIS — Z1152 Encounter for screening for COVID-19: Secondary | ICD-10-CM

## 2024-01-18 DIAGNOSIS — Z880 Allergy status to penicillin: Secondary | ICD-10-CM

## 2024-01-18 DIAGNOSIS — R4182 Altered mental status, unspecified: Principal | ICD-10-CM | POA: Diagnosis present

## 2024-01-18 DIAGNOSIS — R462 Strange and inexplicable behavior: Secondary | ICD-10-CM | POA: Insufficient documentation

## 2024-01-18 DIAGNOSIS — Z6841 Body Mass Index (BMI) 40.0 and over, adult: Secondary | ICD-10-CM

## 2024-01-18 DIAGNOSIS — G4733 Obstructive sleep apnea (adult) (pediatric): Secondary | ICD-10-CM | POA: Diagnosis present

## 2024-01-18 DIAGNOSIS — G9340 Encephalopathy, unspecified: Principal | ICD-10-CM | POA: Diagnosis present

## 2024-01-18 DIAGNOSIS — E876 Hypokalemia: Secondary | ICD-10-CM | POA: Diagnosis present

## 2024-01-18 DIAGNOSIS — I10 Essential (primary) hypertension: Secondary | ICD-10-CM | POA: Insufficient documentation

## 2024-01-18 LAB — DIFFERENTIAL
Abs Immature Granulocytes: 0.02 10*3/uL (ref 0.00–0.07)
Basophils Absolute: 0 10*3/uL (ref 0.0–0.1)
Basophils Relative: 0 %
Eosinophils Absolute: 0.1 10*3/uL (ref 0.0–0.5)
Eosinophils Relative: 1 %
Immature Granulocytes: 0 %
Lymphocytes Relative: 26 %
Lymphs Abs: 1.9 10*3/uL (ref 0.7–4.0)
Monocytes Absolute: 0.7 10*3/uL (ref 0.1–1.0)
Monocytes Relative: 9 %
Neutro Abs: 4.8 10*3/uL (ref 1.7–7.7)
Neutrophils Relative %: 64 %

## 2024-01-18 LAB — POCT I-STAT, CHEM 8
BUN: 22 mg/dL (ref 8–23)
Calcium, Ion: 1.08 mmol/L — ABNORMAL LOW (ref 1.15–1.40)
Chloride: 108 mmol/L (ref 98–111)
Creatinine, Ser: 1.1 mg/dL (ref 0.61–1.24)
Glucose, Bld: 93 mg/dL (ref 70–99)
HCT: 45 % (ref 39.0–52.0)
Hemoglobin: 15.3 g/dL (ref 13.0–17.0)
Potassium: 3.5 mmol/L (ref 3.5–5.1)
Sodium: 142 mmol/L (ref 135–145)
TCO2: 23 mmol/L (ref 22–32)

## 2024-01-18 LAB — COMPREHENSIVE METABOLIC PANEL WITH GFR
ALT: 37 U/L (ref 0–44)
AST: 68 U/L — ABNORMAL HIGH (ref 15–41)
Albumin: 3.9 g/dL (ref 3.5–5.0)
Alkaline Phosphatase: 34 U/L — ABNORMAL LOW (ref 38–126)
Anion gap: 13 (ref 5–15)
BUN: 20 mg/dL (ref 8–23)
CO2: 24 mmol/L (ref 22–32)
Calcium: 9.2 mg/dL (ref 8.9–10.3)
Chloride: 105 mmol/L (ref 98–111)
Creatinine, Ser: 1.12 mg/dL (ref 0.61–1.24)
GFR, Estimated: >60 mL/min (ref >60)
Glucose, Bld: 92 mg/dL (ref 70–99)
Potassium: 3.4 mmol/L — ABNORMAL LOW (ref 3.5–5.1)
Sodium: 142 mmol/L (ref 135–145)
Total Bilirubin: 0.6 mg/dL (ref 0.0–1.2)
Total Protein: 9.4 g/dL — ABNORMAL HIGH (ref 6.5–8.1)

## 2024-01-18 LAB — APTT: aPTT: 31 s (ref 24–36)

## 2024-01-18 LAB — CBC
HCT: 44.7 % (ref 39.0–52.0)
Hemoglobin: 14.4 g/dL (ref 13.0–17.0)
MCH: 27.5 pg (ref 26.0–34.0)
MCHC: 32.2 g/dL (ref 30.0–36.0)
MCV: 85.3 fL (ref 80.0–100.0)
Platelets: 267 10*3/uL (ref 150–400)
RBC: 5.24 MIL/uL (ref 4.22–5.81)
RDW: 14.3 % (ref 11.5–15.5)
WBC: 7.5 10*3/uL (ref 4.0–10.5)
nRBC: 0 % (ref 0.0–0.2)

## 2024-01-18 LAB — CBG MONITORING, ED
Glucose-Capillary: 90 mg/dL (ref 70–99)
Glucose-Capillary: 91 mg/dL (ref 70–99)

## 2024-01-18 LAB — PROTIME-INR
INR: 1.1 (ref 0.8–1.2)
Prothrombin Time: 14.6 s (ref 11.4–15.2)

## 2024-01-18 LAB — ETHANOL: Alcohol, Ethyl (B): <10 mg/dL (ref <10)

## 2024-01-18 LAB — TSH: TSH: 2.751 u[IU]/mL (ref 0.350–4.500)

## 2024-01-18 MED ORDER — POLYVINYL ALCOHOL 1.4 % OP SOLN: 1.0000 [drp] | OPHTHALMIC | Status: DC | PRN

## 2024-01-18 MED ORDER — ACETAMINOPHEN 325 MG PO TABS
650.0000 mg | ORAL_TABLET | Freq: Four times a day (QID) | ORAL | Status: DC | PRN
  Filled 2024-01-18: qty 2

## 2024-01-18 MED ORDER — SODIUM CHLORIDE 0.9% FLUSH
3.0000 mL | Freq: Once | INTRAVENOUS | Status: AC
  Administered 2024-01-18: 3 mL via INTRAVENOUS

## 2024-01-18 MED ORDER — AMLODIPINE BESYLATE 5 MG PO TABS
10.0000 mg | ORAL_TABLET | Freq: Every day | ORAL | Status: DC
  Administered 2024-01-21 – 2024-01-26 (×6): 10 mg via ORAL
  Filled 2024-01-18 (×6): qty 2

## 2024-01-18 MED ORDER — ENOXAPARIN SODIUM 40 MG/0.4ML IJ SOSY
40.0000 mg | PREFILLED_SYRINGE | INTRAMUSCULAR | Status: DC
  Administered 2024-01-18 – 2024-01-22 (×5): 40 mg via SUBCUTANEOUS
  Filled 2024-01-18 (×5): qty 0.4

## 2024-01-18 MED ORDER — ACETAMINOPHEN 650 MG RE SUPP: 650.0000 mg | Freq: Four times a day (QID) | RECTAL | Status: DC | PRN

## 2024-01-18 MED ORDER — SODIUM CHLORIDE 0.9% FLUSH
3.0000 mL | Freq: Two times a day (BID) | INTRAVENOUS | Status: DC
  Administered 2024-01-18 – 2024-01-26 (×12): 3 mL via INTRAVENOUS

## 2024-01-18 MED ORDER — POLYETHYLENE GLYCOL 3350 17 G PO PACK: 17.0000 g | PACK | Freq: Every day | ORAL | Status: DC | PRN

## 2024-01-18 MED ORDER — LACTATED RINGERS IV BOLUS
1000.0000 mL | Freq: Once | INTRAVENOUS | Status: AC
  Administered 2024-01-18: 1000 mL via INTRAVENOUS

## 2024-01-18 NOTE — Progress Notes (Signed)
 Patient is in hallway. EEG to be placed when patient is in room.
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
## 2024-01-18 NOTE — ED Provider Notes (Addendum)
 Behavioral Health Urgent Care Medical Screening Exam  Patient Name: Caleb Leblanc MRN: [id number] Date of Evaluation: 01/18/24 Chief Complaint:  " I need to have my medication looked at."  Diagnosis:  Final diagnoses:  Medication management    History of Present illness: Caleb Leblanc is a 63 y.o. male.  Presents to [facility] urgent care accompanied by housemate from the [facility] house.  Staff report patient was acting bizarre last night.  States Caleb Leblanc has not been sleeping.  States Caleb Leblanc woke up this morning and was reporting Caleb Leblanc was unable to walk.  Reports Caleb Leblanc needs help with his medications.   Caleb Leblanc reports Caleb Leblanc was [facility] the [facility].  Denied that Caleb Leblanc is followed by therapy or psychiatry while in prison.  Medications reviewed patient is currently prescribed amlodipine, Flomax, metformin, Claritin, amitriptyline and Protonix.  States Caleb Leblanc has been taking and tolerating his medications well.  Caleb Leblanc denied suicidal or homicidal ideations.  Denies auditory or visual hallucinations.  Caleb Leblanc appears slightly desponded but reactive to questions.  Encouraged patient to follow-up with primary care and/or the local emergency department to rule out a medical conditions such at urinary tract infection.  patient has a documented history related to encephalopathy and altered mental status.  Caleb Leblanc is resting in a wheelchair; Caleb Leblanc is alert/oriented x 3; calm/cooperative; and mood congruent with affect.  Patient is speaking in a clear tone at moderate volume, and normal pace; with fair eye contact.    His thought process is coherent and relevant, slow to respond, verbally redirected patient is engaging; There is no indication that Caleb Leblanc is currently responding to internal/external stimuli or experiencing delusional thought content.  Patient denies suicidal/self-harm/homicidal ideation, psychosis, and paranoia.  Patient has remained calm throughout assessment and has answered questions  appropriately.    Flowsheet Row ED from 01/18/2024 in [facility]  C-SSRS RISK CATEGORY No Risk       Psychiatric Specialty Exam  Presentation  General Appearance:Appropriate for Environment  Eye Contact:Good  Speech:Clear and Coherent  Speech Volume:Normal  Handedness:Right   Mood and Affect  Mood:Euthymic  Affect:Congruent   Thought Process  Thought Processes:Linear; Coherent  Descriptions of Associations:Intact  Orientation:Full (Time, Place and Person)  Thought Content:Logical    Hallucinations:None  Ideas of Reference:None  Suicidal Thoughts:No  Homicidal Thoughts:No   Sensorium  Memory:Immediate Poor  Judgment:Fair  Insight:Fair   Executive Functions  Concentration:Fair  Attention Span:Fair  Recall:Fair  Fund of Knowledge:Fair  Language:Fair   Psychomotor Activity  Psychomotor Activity:Normal   Assets  Assets:Desire for Improvement; Social Support   Sleep  Sleep:Poor  Number of hours: No data recorded  Physical Exam: Physical Exam Vitals and nursing note reviewed.  Constitutional:      Appearance: Normal appearance.  Cardiovascular:     Rate and Rhythm: Normal rate.  Neurological:     Mental Status: Caleb Leblanc is alert and oriented to person, place, and time.  Psychiatric:        Mood and Affect: Mood normal.        Behavior: Behavior normal.    Review of Systems  Psychiatric/Behavioral:  Negative for substance abuse and suicidal ideas. The patient is not nervous/anxious.   All other systems reviewed and are negative.  Blood pressure (!) 142/82, pulse 85, temperature 98.5 F (36.9 C), temperature source Oral, resp. rate 18, SpO2 98%. There is no height or weight on file to calculate BMI.  Musculoskeletal: Strength & Muscle Tone:  patient sitting in  wheelchair Gait & Station:  N/A Patient leans: N/A   [facility] MSE Discharge Disposition for Follow up and Recommendations: Discussed with  patient's staff mate, for patient to follow-up for a medical work-up, Caleb Leblanc reported that Caleb Leblanc will take patient to the  "504"  and or the emergency department, if symptoms prsist or worsen. Stated " we will take him right now."     Oneta Rack, NP 01/18/2024, 8:59 AM

## 2024-01-18 NOTE — H&P (Addendum)
 History and Physical   Caleb Leblanc FAO:[id number] DOB: 12/01/1960 DOA: 01/18/2024  PCP: Patient, No Pcp Per   Patient coming from: [facility]  Chief Complaint: Altered mental status  HPI: Caleb Leblanc is a 63 y.o. male with medical history significant of obesity, OSA, hypertension presenting with hyperemesis.  Patient recently got out of jail about a week ago.  He is now at the [facility] house.  His roommate helped with history as patient is unresponsive.  Patient was initially normal and then had decreased interaction yesterday and this morning is altered or refusing to speak and not moving his arms or legs in a somewhat catatonic state.  Reportedly had similar admission in 2021 after full evaluation and negative workup, it was determined he possibly could be seeking secondary gain/malingering.  On my exam patient able to respond to some questions when I give him time.  Seems to be dwelling on the mistakes he is made in the past.  Is saying unusual things about "stuck in the web "and other perseverations.  Notes he has not had CPAP in prison.  Not able to obtain review systems due to lack of patient participation/not responding to the questions asked.  ED Course: Vital signs in the ED notable for blood pressure in the 120s-140 systolic.  Lab workup included CMP with potassium 3.4, protein 9.4, AST 60.  CBC within normal limits.  PT, PTT, INR within normal limits.  Ethanol level negative.  UDS and urinalysis pending.  CT head showed no acute normality.  Patient received 1 L IV fluids in the ED.  TTS/psychiatry was consulted and recommended ruling out metabolic etiology.  Do not feel there is a psychiatric cause for his delirium/ultimately status.  Recommended considering one-to-one safety observation but do not feel the patient is a harm to himself or others at this time.  They have signed off.  Review of Systems: Not able to obtain review systems due to lack of patient participation/not  responding to the questions asked.  Past Medical History:  Diagnosis Date   Hypertension     History reviewed. No pertinent surgical history.  Social History  reports that he quit smoking about 6 years ago. His smoking use included cigarettes. He has never used smokeless tobacco. He reports that he does not currently use alcohol. He reports that he does not use drugs.  Allergies  Allergen Reactions   Amoxicillin Anaphylaxis   Augmentin [Amoxicillin-Pot Clavulanate] Anaphylaxis    History reviewed. No pertinent family history.  Prior to Admission medications   Medication Sig Start Date End Date Taking? Authorizing Provider  amLODipine (NORVASC) 10 MG tablet Take 10 mg by mouth daily.    [provider]  polyvinyl alcohol (LIQUIFILM TEARS) 1.4 % ophthalmic solution Place 1 drop into both eyes as needed for dry eyes. 08/19/20   Darlin Drop, DO  potassium chloride SA (KLOR-CON) 10 MEQ tablet Take 1 tablet (10 mEq total) by mouth daily for 5 days. 08/19/20 08/24/20  Darlin Drop, DO    Physical Exam: Vitals:   01/18/24 0856 01/18/24 1212 01/18/24 1534  BP: 129/79 (!) 146/94 (!) 148/81  Pulse: 89 78 77  Resp: 18 18 16   Temp: 98.7 F (37.1 C)    SpO2: 100% 100% 99%    Physical Exam Constitutional:      General: He is not in acute distress.    Appearance: Normal appearance. He is obese.  HENT:     Head: Normocephalic and atraumatic.  Mouth/Throat:     Mouth: Mucous membranes are moist.     Pharynx: Oropharynx is clear.  Eyes:     Extraocular Movements: Extraocular movements intact.     Pupils: Pupils are equal, round, and reactive to light.  Cardiovascular:     Rate and Rhythm: Normal rate and regular rhythm.     Pulses: Normal pulses.     Heart sounds: Normal heart sounds.  Pulmonary:     Effort: Pulmonary effort is normal. No respiratory distress.     Breath sounds: Normal breath sounds.  Abdominal:     General: Bowel sounds are normal. There is no  distension.     Palpations: Abdomen is soft.     Tenderness: There is no abdominal tenderness.  Musculoskeletal:        General: No swelling or deformity.  Skin:    General: Skin is warm and dry.  Neurological:     General: No focal deficit present.     Mental Status: Mental status is at baseline.  Psychiatric:        Mood and Affect: Mood is anxious and depressed.     Comments: Perseveration on mistakes and it being too late and "stuck in the way ""on a collision course "    Labs on Admission: I have personally reviewed following labs and imaging studies  CBC: Recent Labs  Lab 01/18/24 0956 01/18/24 1002  WBC 7.5  --   NEUTROABS 4.8  --   HGB 14.4 15.3  HCT 44.7 45.0  MCV 85.3  --   PLT 267  --     Basic Metabolic Panel: Recent Labs  Lab 01/18/24 0956 01/18/24 1002  NA 142 142  K 3.4* 3.5  CL 105 108  CO2 24  --   GLUCOSE 92 93  BUN 20 22  CREATININE 1.12 1.10  CALCIUM 9.2  --     GFR: CrCl cannot be calculated (Unknown ideal weight.).  Liver Function Tests: Recent Labs  Lab 01/18/24 0956  AST 68*  ALT 37  ALKPHOS 34*  BILITOT 0.6  PROT 9.4*  ALBUMIN 3.9    Urine analysis:    Component Value Date/Time   COLORURINE YELLOW 08/16/2020 2340   APPEARANCEUR CLEAR 08/16/2020 2340   LABSPEC 1.026 08/16/2020 2340   PHURINE 5.0 08/16/2020 2340   GLUCOSEU NEGATIVE 08/16/2020 2340   HGBUR NEGATIVE 08/16/2020 2340   BILIRUBINUR NEGATIVE 08/16/2020 2340   KETONESUR 20 (A) 08/16/2020 2340   PROTEINUR NEGATIVE 08/16/2020 2340   NITRITE NEGATIVE 08/16/2020 2340   LEUKOCYTESUR NEGATIVE 08/16/2020 2340    Radiological Exams on Admission: CT HEAD WO CONTRAST Result Date: 01/18/2024 CLINICAL DATA:  Neuro deficit, acute, stroke suspected EXAM: CT HEAD WITHOUT CONTRAST TECHNIQUE: Contiguous axial images were obtained from the base of the skull through the vertex without intravenous contrast. RADIATION DOSE REDUCTION: This exam was performed according to the  departmental dose-optimization program which includes automated exposure control, adjustment of the mA and/or kV according to patient size and/or use of iterative reconstruction technique. COMPARISON:  08/15/2020 FINDINGS: Brain: Mild chronic microvascular ischemic changes throughout the deep white matter. No acute intracranial abnormality. Specifically, no hemorrhage, hydrocephalus, mass lesion, acute infarction, or significant intracranial injury. Vascular: No hyperdense vessel or unexpected calcification. Skull: No acute calvarial abnormality. Sinuses/Orbits: No acute findings Other: None IMPRESSION: Chronic microvascular ischemic changes throughout the deep white matter. No acute intracranial abnormality. Electronically Signed   By: Charlett Nose M.D.   On: 01/18/2024 12:20  EKG: Independently reviewed.  Sinus rhythm at 81 beats minute.  Nonspecific T wave changes.  Assessment/Plan Principal Problem:   AMS (altered mental status) Active Problems:   OSA (obstructive sleep apnea)   Morbid obesity due to excess calories (HCC)   HTN (hypertension)   Acute encephalopathy   Acute encephalopathy > Patient presenting with altered mental status at [facility] house where he has been for the past week after getting out of jail. > Began to be less interactive yesterday and this morning would not move his arms or legs and was not responding.  Somewhat catatonic state. > Similar episode in 2021 with unrevealing workup and thought possibly could have been for secondary gain/malingering (was in jail at that time). > Thus far workup unrevealing with normal CT head.  No evidence of infection.  Ethanol negative.  UDS and urinalysis pending. > Psych consult without psychiatric solution and they have signed off.  Will reconsult inpatient psych due to the patient's perseveration on his mistake in the past and saying unusual things about it being too late and being stuck in the web. > Hold off MRI with more of a  psych/generalized component. - Monitor on telemetry overnight - Reconsult psych - EEG to rule out seizure/postictal component. - Ammonia, TSH - Supportive care  Hypertension - Continue home amlodipine  Obesity - Noted  OSA - CPAP  DVT prophylaxis: Lovenox Code Status:   Full Family Communication:  None on admission Disposition Plan:   Patient is from:  [facility]  Anticipated DC to:  Same as above  Anticipated DC date:  1 to 3 days  Anticipated DC barriers: None  Consults called:  Psychiatry, signed off. Admission status:  Observation, telemetry  Severity of Illness: The appropriate patient status for this patient is OBSERVATION. Observation status is judged to be reasonable and necessary in order to provide the required intensity of service to ensure the patient's safety. The patient's presenting symptoms, physical exam findings, and initial radiographic and laboratory data in the context of their medical condition is felt to place them at decreased risk for further clinical deterioration. Furthermore, it is anticipated that the patient will be medically stable for discharge from the hospital within 2 midnights of admission.    Synetta Fail MD [facility]  How to contact the [facility] Attending or Consulting provider 7A - 7P or covering provider during after hours 7P -7A, for this patient?   Check the care team in [facility] and look for a) attending/consulting [facility] provider listed and b) the [facility] team listed Log into [email or web address] and use [facility]'s universal password to access. If you do not have the password, please contact the hospital operator. Locate the [facility] provider you are looking for under [facility] and page to a number that you can be directly reached. If you still have difficulty reaching the provider, please page the [facility] (Director on Call) for the Hospitalists listed on amion for assistance.  01/18/2024, 6:04 PM

## 2024-01-18 NOTE — ED Triage Notes (Addendum)
 Pt. Stated, My stomach hurts sometimes. Unable to give me any other symptoms. "Im taking too many medications. Ive felt bad for a week. Pt stated, DOB, Place, date. Friend stated, Last night he was ok but stayed up all night. He went to mental health this morning and they said to come here.  He just went into the [facility] one week ago.

## 2024-01-18 NOTE — ED Notes (Addendum)
 Pt refused to have temperature taken RN aware.

## 2024-01-18 NOTE — ED Provider Notes (Signed)
 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 Radiology: ordered.  Risk Decision regarding hospitalization.   Patient remained stable throughout the remainder of my shift.  When I went to assess him, he will intermittently follow commands, will track with his eyes will not verbally respond to my questions.  Friend is at bedside, states that patient has been intermittently speaking with him.  Given that patient has been unable to urinate with unsuccessful In-N-Out catheterization, will order 1 L fluid bolus to help collect urine sample.    Psychiatry does not feel that this presentation is of primary psychiatric concern before more thorough workup with hospitalist admission.  Patient will be admitted to hospitalist for further monitoring and management, with handoff given to admitting team.  Patient seen in conjunction with Dr. Liam Redhead, who agreed with the above work-up and plan of care.     Lorain Robson, MD 01/18/24 1943    Burnette Carte, MD 02/16/24 [phone]

## 2024-01-18 NOTE — ED Provider Notes (Signed)
  EMERGENCY DEPARTMENT AT [facility] Provider Note   CSN: [id number] Arrival date & time: 01/18/24  4098     History  Chief Complaint  Patient presents with   Abdominal Pain   Weakness   stroke like symptoms    Caleb Leblanc is a 63 y.o. male.  The history is provided by the patient, a friend and medical records. No language interpreter was used.  Abdominal Pain Weakness Associated symptoms: abdominal pain      63 year old male with history of hypertension, recent incarceration, brought here from the [facility] with concerns of altered mental status.  Patient was released from the jail about a week ago.  History obtained through his roommate who is at bedside.  He was doing fine for several days but last night his roommate noticed that patient Standing in 1 spot in the room not interacting.  This morning patient appears altered, and acting abnormal, not following command and not moving his arms and legs.  Patient was brought here for further evaluation.  History is difficult to obtain from patient as he appears to be confused and not engaging in the exam.  Able 5 caveat applies.  EMR reviewed patient had a similar documented episode in 2021 when he was behaving very similar.  His workup at that time was unremarkable.  He was medically cleared, neurology felt it could be secondary gain possibly malingering.  Home Medications Prior to Admission medications   Medication Sig Start Date End Date Taking? Authorizing Provider  amLODipine (NORVASC) 10 MG tablet Take 10 mg by mouth daily.    [provider]  polyvinyl alcohol (LIQUIFILM TEARS) 1.4 % ophthalmic solution Place 1 drop into both eyes as needed for dry eyes. 08/19/20   Darlin Drop, DO  potassium chloride SA (KLOR-CON) 10 MEQ tablet Take 1 tablet (10 mEq total) by mouth daily for 5 days. 08/19/20 08/24/20  Darlin Drop, DO      Allergies    Amoxicillin and Augmentin [amoxicillin-pot  clavulanate]    Review of Systems   Review of Systems  Unable to perform ROS: Mental status change  Gastrointestinal:  Positive for abdominal pain.  Neurological:  Positive for weakness.    Physical Exam Updated Vital Signs BP (!) 146/94   Pulse 78   Temp 98.7 F (37.1 C)   Resp 18   SpO2 100%  Physical Exam Constitutional:      General: He is not in acute distress.    Appearance: He is well-developed.  HENT:     Head: Atraumatic.  Eyes:     Conjunctiva/sclera: Conjunctivae normal.  Cardiovascular:     Rate and Rhythm: Normal rate and regular rhythm.  Pulmonary:     Effort: Pulmonary effort is normal.     Breath sounds: Normal breath sounds.  Musculoskeletal:     Cervical back: Normal range of motion and neck supple.  Skin:    Findings: No rash.  Neurological:     Mental Status: He is alert. He is disoriented.     GCS: GCS eye subscore is 4. GCS verbal subscore is 4. GCS motor subscore is 6.  Psychiatric:        Mood and Affect: Affect is blunt.        Speech: He is noncommunicative.        Behavior: Behavior is slowed.     ED Results / Procedures / Treatments   Labs (all labs ordered are listed, but only abnormal  results are displayed) Labs Reviewed  COMPREHENSIVE METABOLIC PANEL WITH GFR - Abnormal; Notable for the following components:      Result Value   Potassium 3.4 (*)    Total Protein 9.4 (*)    AST 68 (*)    Alkaline Phosphatase 34 (*)    All other components within normal limits  PROTIME-INR  APTT  CBC  DIFFERENTIAL  ETHANOL  RAPID URINE DRUG SCREEN, HOSP PERFORMED  URINALYSIS, COMPLETE (UACMP) WITH MICROSCOPIC  CBG MONITORING, ED  I-STAT CHEM 8, ED  CBG MONITORING, ED    EKG EKG Interpretation Date/Time:  Thursday January 18 2024 09:57:09 EDT Ventricular Rate:  81 PR Interval:  150 QRS Duration:  96 QT Interval:  406 QTC Calculation: 471 R Axis:   1  Text Interpretation: Normal sinus rhythm Cannot rule out Anterior infarct , age  undetermined No significant change since last tracing When compared with ECG of 15-Aug-2020 22:03, PREVIOUS ECG IS PRESENT Confirmed by Gwyneth Sprout [phone]) on 01/18/2024 10:06:44 AM  Radiology CT HEAD WO CONTRAST Result Date: 01/18/2024 CLINICAL DATA:  Neuro deficit, acute, stroke suspected EXAM: CT HEAD WITHOUT CONTRAST TECHNIQUE: Contiguous axial images were obtained from the base of the skull through the vertex without intravenous contrast. RADIATION DOSE REDUCTION: This exam was performed according to the departmental dose-optimization program which includes automated exposure control, adjustment of the mA and/or kV according to patient size and/or use of iterative reconstruction technique. COMPARISON:  08/15/2020 FINDINGS: Brain: Mild chronic microvascular ischemic changes throughout the deep white matter. No acute intracranial abnormality. Specifically, no hemorrhage, hydrocephalus, mass lesion, acute infarction, or significant intracranial injury. Vascular: No hyperdense vessel or unexpected calcification. Skull: No acute calvarial abnormality. Sinuses/Orbits: No acute findings Other: None IMPRESSION: Chronic microvascular ischemic changes throughout the deep white matter. No acute intracranial abnormality. Electronically Signed   By: Charlett Nose M.D.   On: 01/18/2024 12:20    Procedures Procedures    Medications Ordered in ED Medications  sodium chloride flush (NS) 0.9 % injection 3 mL (has no administration in time range)    ED Course/ Medical Decision Making/ A&P Clinical Course as of 01/18/24 1517  Thu Jan 18, 2024  1511 S- hx HTN, released from jail to rehab, pw features c/f catanonia (similar presentation 2 years ago), nonfocal neuro deficits, CTH ok, psych saw  [ ]  pend recs from psych [AO]    Clinical Course User Index [AO] Barrett Shell, MD                                 Medical Decision Making Amount and/or Complexity of Data Reviewed Labs: ordered. Radiology:  ordered.   BP (!) 146/94   Pulse 78   Temp 98.7 F (37.1 C)   Resp 18   SpO2 100%   37:80 PM  63 year old male with history of hypertension, recent incarceration, brought here from the [facility] with concerns of altered mental status.  Patient was released from the jail about a week ago.  History obtained through his roommate who is at bedside.  He was doing fine for several days but last night his roommate noticed that patient Standing in 1 spot in the room not interacting.  This morning patient appears altered, and acting abnormal, not following command and not moving his arms and legs.  Patient was brought here for further evaluation.  History is difficult to obtain from patient as he appears to be  confused and not engaging in the exam.  Able 5 caveat applies.  EMR reviewed patient had a similar documented episode in 2021 when he was behaving very similar.  His workup at that time was unremarkable.  He was medically cleared, neurology felt it could be secondary gain possibly malingering.  On exam, patient is sitting in the chair appears to be in no acute discomfort.  GCS score of 14.  Neuroexam is difficult to obtain as patient does not appear to follow commands at some point but follows command at other point.  However no focal deficit appreciated.  No facial droop.  I am able to witness patient moving all 4 extremities.  Vital signs overall reassuring.  Patient symptoms suggestive of some catatonic state  -Labs ordered, independently viewed and interpreted by me.  Labs remarkable for reassuring labs.  UDS currently pending -The patient was maintained on a cardiac monitor.  I personally viewed and interpreted the cardiac monitored which showed an underlying rhythm of: Normal sinus rhythm -Imaging independently viewed and interpreted by me and I agree with radiologist's interpretation.  Result remarkable for head CT scan unremarkable -This patient presents to the ED for concern of altered  mental status, this involves an extensive number of treatment options, and is a complaint that carries with it a high risk of complications and morbidity.  The differential diagnosis includes medication side effect, malingering, stroke, infectious etiology, metabolic derangement -Co morbidities that complicate the patient evaluation includes hypertension, -Treatment includes monitoring -Reevaluation of the patient after these medicines showed that the patient stayed the same -PCP office notes or outside notes reviewed -Discussion with specialist psychiatry team to evaluate patient. Pt sign out to oncoming team to f/u on psych rec and to f/u on UA and UDS -Escalation to admission/observation considered:dispo pending         Final Clinical Impression(s) / ED Diagnoses Final diagnoses:  Altered mental status, unspecified altered mental status type    Rx / DC Orders ED Discharge Orders     None         Fayrene Helper, PA-C 01/18/24 1518    Wynetta Fines, MD 01/18/24 [phone]

## 2024-01-18 NOTE — Consult Note (Signed)
 [facility] Psychiatric Consult Initial  Patient Name: .Caleb Leblanc  MRN: [id number]  DOB: Mar 08, 1961  Consult Order details:  Orders (From admission, onward)     Start     Ordered   01/18/24 1426  CONSULT TO CALL ACT TEAM       Ordering Provider: Fayrene Helper, PA-C  Provider:  (Not yet assigned)  Question:  Reason for Consult?  Answer:  altered mental status   01/18/24 1425             Mode of Visit: In person    Psychiatry Consult Evaluation  Service Date: January 18, 2024 LOS:  LOS: 0 days  Chief Complaint: "Yeah, they keep asking me that"   Primary Psychiatric Diagnoses  AMS (altered mental status)  Assessment   Caleb Leblanc is a 63 y.o. AA male with no known past psychiatric history, and pertinent medical comorbidities/history that include 2021 hospitalization for unknown etiology altered mental status (I.e., unclear medical etiology metabolic encephalopathy versus secondary gain/factitiousness), who initially presented to the [facility] earlier today, for endorsements of needing his medications, who after being psychiatrically cleared from psychiatric evaluation, was recommended to follow-up with medical services, to rule out medical etiology causing patient's symptoms reported  by individual present with the patient (I.e., recent observation of bizarre behavior), who then arrived to the [facility] a few short hours later for further evaluation and care, that upon EDP evaluation, felt needed further evaluation and workup from psychiatry, thus psychiatry was consulted.  Per EDP team, patient is medically clear at this time, as well as voluntary.  Upon evaluation, patient presents with symptomology that is most consistent with altered mental status of unknown etiology at this time.  From evaluation conducted, there is no evidence currently of the patient presenting with endorsements of suicidal and or homicidal ideations, presenting with endorsements of instability in his mental  health in the form of depression and/or anxiety (I.e., psychomotor retardation), presenting with evidence of psychosis (I.e., appearing to be responding to external and/or internal stimuli; making endorsements of hearing or seeing things, expressing delusional themes, expressing paranoia, or expressing ideas of reference), appearing to be intoxicated on illicit substances and/or EtOH, and/or presenting with symptomology consistent with catatonia, upon formal [place]. During time of investigation however, UDS and UA repeatedly unable to be obtained by staff attempts, thus this still at this time has to be considered in the differential, though there is low clinical suspicion, given evaluation performed, as well as the patient's history and previous evaluations and care provided that is available upon chart review (See 2021 hospitalization).  Given the aforementioned, in addition to the patient's history upon chart review, do recommend at this time consider hospitalist and neurology consultation, as well as possibly obtaining additional studies, to rule out further medical etiologies.  Nonetheless however, please utilize low threshold for reconsultation to psychiatry, if there is clinical suspicion going forward for primary underlying psychiatric etiology.   Spoke with Dr. Enedina Finner who is in agreement with recommendations and plan to move forward at this time.  Diagnoses:  Active Hospital problems: Principal Problem:   AMS (altered mental status)    Plan   # Altered mental status (unknown etiology)  ## Psychiatric Recommendations:   -Recommend consider hospitalist consult - Recommend consider neurology consult - Recommend consider additional follow-up studies listed below to rule out encephalopathy or other causes  ## Medical Decision Making Capacity: Does not appear to have capacity  ## Further Work-up:   Could consider  the following studies --> Ammonia, UA, UDS, EEG, MRI  ##  Disposition:--There is no psychiatric recommendation for disposition  ## Behavioral / Environmental: -Delirium Precautions: Delirium Interventions for Nursing and Staff: - RN to open blinds every AM. - To Bedside: Glasses, hearing aide, and pt's own shoes. Make available to patients. when possible and encourage use. - Encourage po fluids when appropriate, keep fluids within reach. - OOB to chair with meals. - Passive ROM exercises to all extremities with AM & PM care. - RN to assess orientation to person, time and place QAM and PRN. - Recommend extended visitation hours with familiar family/friends as feasible. - Staff to minimize disturbances at night. Turn off television when pt asleep or when not in use.  ## Safety and Observation Level:  - Based on my clinical evaluation, I estimate the patient to be at moderate risk of self harm in the current setting. - At this time, we recommend  1:1 Observation. This decision is based on my review of the chart including patient's history and current presentation, interview of the patient, mental status examination, and consideration of suicide risk including evaluating suicidal ideation, plan, intent, suicidal or self-harm behaviors, risk factors, and protective factors. This judgment is based on our ability to directly address suicide risk, implement suicide prevention strategies, and develop a safety plan while the patient is in the clinical setting. Please contact our team if there is a concern that risk level has changed.  CSSR Risk Category:C-SSRS RISK CATEGORY: No Risk  Suicide Risk Assessment: Patient has following modifiable risk factors for suicide: None, which we are addressing by evaluation/recommendations. Patient has following non-modifiable or demographic risk factors for suicide: male gender Patient has the following protective factors against suicide: No psychiatric history  Thank you for this consult request. Recommendations have been  communicated to the primary team.  We will sign off at this time.   Lenox Ponds, NP       History of Present Illness   Caleb Leblanc is a 63 y.o. AA male with no known past psychiatric history, and pertinent medical comorbidities/history that include 2021 hospitalization for unknown etiology altered mental status (I.e., unclear medical etiology metabolic encephalopathy versus secondary gain/factitiousness), who initially presented to the [facility] earlier today, for endorsements of needing his medications, who after being psychiatrically cleared from psychiatric evaluation, was recommended to follow-up with medical services, to rule out medical etiology causing patient's symptoms reported  by individual present with the patient (I.e., recent observation of bizarre behavior), who then arrived to the [facility] a few short hours later for further evaluation and care, that upon EDP evaluation, felt needed further evaluation and workup from psychiatry, thus psychiatry was consulted.  Per EDP team, patient is medically clear at this time, as well as voluntary.  Patient seen today at the [facility] emergency department for face-to-face psychiatric evaluation.  Upon evaluation, patient engagement is largely characterized by oddly related and atypical interpersonal style, with variable to brief eye contact, and oddly constricted affect, and an appreciable variable ability to answer questions asked (and/or is intentionally selective in answering this provider), despite frequent verbal prompting and reasking and/or restating of questions, but without appearing to be presenting with fluctuations of consciousness.   Patient asked what brought him in this encounter, to which patient states after pausing for a meaningful period of time, while appearing meaningfully oddly related and atypical, "yeah, they keep asking me that."  Patient asked again what brought him in this encounter,  but gives no answer.  Patient asked  about his evaluation at the [facility] a few short hours ago, and their recommendation to go to the emergency department for further medical workup, to which patient states, "yeah they did."  Patient asked if he went to the [facility] for any psychiatric complaints that he could articulate to this provider, but gives no answer for a meaningful period of time, then states, "I got stuff going on, so we came here".  Patient appreciably was psychiatrically cleared earlier today by [facility] provider; found no evidence of psychosis, suicidal and or homicidal expressions, depressive and/or anxious symptomology, and/or concerns for safety.   Patient asked to expand on previous statement, then again pauses for a meaningful period time, and appears that he is not going to answer the question asked, then states, "I got pain".  Patient asked where he is hurting and/or experiencing medical complications, but despite numerous prompting, gives no answer.  Per chart review, patient appreciably presented with initial complaints of stomach pains. Patient asked orientation questions repeatedly, but patient unable to provide any answers, and/or will not, this is unclear (nursing reports however that the patient has been oriented upon all of their serial evaluations this encounter).   Patient asked if he is experiencing any depressive and/or anxious symptomology, states, "no, but I just got out of prison" (appear to be suggesting this as a psychosocial stressor, though will not/can't ellaborate). Patient asked about suicidal and or homicidal ideations, denies both of these, but when attempted to be asked about history of suicide attempts and/or self-injurious behavior, gives no answer.  Patient asked about experiencing auditory and or visual hallucinations, denies both of these, and objectively, does not appear to be responding to internal and/or external stimuli.  Patient asked about paranoia, tested for delusional themes, and tested for ideas of  reference, but appreciably gives no endorsements of the aforementioned.  Patient objectively shows no signs of fear, appearing distressed, and/or paranoid.  Patient asked about illicit substance use, tobacco, and/or EtOH, to which at this time, and very oddly, patient responds intently and sharply (curtly if you will), denying any illicit substance use, EtOH use, and/or tobacco.  Chart review reveals a history of smoking tobacco, but no drugs, and/or EtOH use; UDS/UA not obtained at this time, BAL unremarkable.  Patient asked to expand on why he went to prison, to which patient is able to articulate to this writer that he has been in prison since 2021 "I believe", but despite numerous attempts, does not, and/or cannot, articulate to this provider why he was in prison since 2021.  Patient asked if the last time he has utilized hospital services has been since 2021 hospital encounter, to which she states, "yeah... I think it was".  Patient asked if he is on any medications, to which he superficially states that he is.  Patient asked if he has any mental health history, able to answer this provider and denies this.  Patient asked about any inpatient mental health hospitalizations, does not answer this.  Patient asked about any psychiatric care received in prison, does not answer this (per chart review, has never received mental health services in prison).   Patient attempted to be asked further questions, but begins not answering further questions. Formal Loni Beckwith performed, and patient is assessed for symptomology consistent with illicit substance use and/or EtOH withdrawal, which the patient test negative for both, then interview was terminated.  Unable to contact [facility] house staff member for collateral  Per reports from earlier however, patient was brought in due to abrupt onset of altered mental status.  Review of Systems  Unable to perform ROS: Mental acuity (Limited participation)   Constitutional:  Positive for malaise/fatigue.  Musculoskeletal:  Positive for myalgias.  Neurological:  Positive for weakness.  Psychiatric/Behavioral:  Negative for depression, hallucinations, substance abuse and suicidal ideas. The patient does not have insomnia.     Psychiatric and Social History  Psychiatric History:  Information collected from chart review  Prev Dx/Sx: None reported Current Psych Provider:  None reported Home Meds (current): None reported Previous Med Trials: None reported Therapy: None reported  Prior Psych Hospitalization: None reported  Prior Self Harm: None reported Prior Violence: None reported  Family Psych History: None reported Family Hx suicide: None reported  Social History:  Developmental Hx: None reported Educational Hx: None reported Occupational Hx: None reported Legal Hx: Just got out of prison about a week ago Living Situation: Lives at [facility], per staff member report Spiritual Hx: None reported  Access to weapons/lethal means: None reported   Substance History Alcohol: : None reported  Type of alcohol : None reported Last Drink : None reported Number of drinks per day : None reported History of alcohol withdrawal seizures : None reported History of DT's : None reported Tobacco: History of smoking, none reported recently Illicit drugs: None reported Prescription drug abuse: None reported Rehab hx: None reported  Exam Findings  Physical Exam: As below Vital Signs:  Temp:  [98.5 F (36.9 C)-98.7 F (37.1 C)] 98.7 F (37.1 C) (04/10 0856) Pulse Rate:  [78-89] 78 (04/10 1212) Resp:  [18] 18 (04/10 1212) BP: (129-146)/(79-94) 146/94 (04/10 1212) SpO2:  [98 %-100 %] 100 % (04/10 1212) Blood pressure (!) 146/94, pulse 78, temperature 98.7 F (37.1 C), resp. rate 18, SpO2 100%. There is no height or weight on file to calculate BMI.  Physical Exam Vitals and nursing note reviewed.  Constitutional:      General: He is  not in acute distress.    Appearance: He is obese. He is not ill-appearing, toxic-appearing or diaphoretic.     Comments: Oddly related and atypical interpersonal style   Pulmonary:     Effort: Pulmonary effort is normal.  Skin:    General: Skin is warm and dry.  Neurological:     Motor: No tremor or seizure activity.  Psychiatric:        Attention and Perception: Perception normal. He is inattentive. He does not perceive auditory or visual hallucinations.        Mood and Affect: Affect is inappropriate.        Speech: He is noncommunicative. Speech is delayed.        Behavior: Behavior is withdrawn.        Thought Content: Thought content is not paranoid or delusional. Thought content does not include homicidal or suicidal ideation.        Cognition and Memory: Cognition is impaired. Memory is impaired.    Mental Status Exam: General Appearance:  Atypical and oddly related interpersonal style  Orientation:  Other:  Unable to assess  Memory: Unable to assess  Concentration:  Concentration: Poor and Attention Span: Poor  Recall:   Unable to assess  Attention  Poor  Eye Contact:   Variable to brief  Speech: Abrupt, abnormal pattern  Language:   Variable  Volume:  Normal  Mood: "it's getting there"  Affect: Oddly constricted  Thought Process: Short, reserved, vague, odd  Thought  Content: Largely logical but atypical  Suicidal Thoughts:  No  Homicidal Thoughts:  No  Judgement:  Impaired  Insight:   Acutely poor  Psychomotor Activity:  Normal  Akathisia:  No  Fund of Knowledge:   Unable to assess      Assets:  Desire for Improvement Housing Resilience Social Support Transportation  Cognition:  Impaired,  Mild  ADL's:  Impaired; reported to not be able to attend to  AIMS (if indicated):        Other History   These have been pulled in through the EMR, reviewed, and updated if appropriate.  Family History:  The patient's family history is not on file.  Medical  History: Past Medical History:  Diagnosis Date   Hypertension     Surgical History: History reviewed. No pertinent surgical history.   Medications:   Current Facility-Administered Medications:    sodium chloride flush (NS) 0.9 % injection 3 mL, 3 mL, Intravenous, Once, Messick, Noralyn Pick, MD  Current Outpatient Medications:    amLODipine (NORVASC) 10 MG tablet, Take 10 mg by mouth daily., Disp: , Rfl:    polyvinyl alcohol (LIQUIFILM TEARS) 1.4 % ophthalmic solution, Place 1 drop into both eyes as needed for dry eyes., Disp: 15 mL, Rfl: 0   potassium chloride SA (KLOR-CON) 10 MEQ tablet, Take 1 tablet (10 mEq total) by mouth daily for 5 days., Disp: 5 tablet, Rfl: 0  Allergies: Allergies  Allergen Reactions   Amoxicillin Anaphylaxis   Augmentin [Amoxicillin-Pot Clavulanate] Anaphylaxis    Lenox Ponds, NP

## 2024-01-18 NOTE — Discharge Instructions (Addendum)

## 2024-01-18 NOTE — ED Notes (Signed)
 Pt discharged and escorted out by provider.

## 2024-01-18 NOTE — ED Notes (Signed)
Dr. Melvin at bedside 

## 2024-01-18 NOTE — ED Provider Triage Note (Signed)
 Emergency Medicine Provider Triage Evaluation Note  KAREE CHRISTOPHERSON , a 63 y.o. male  was evaluated in triage.  Pt complains of altered mental status. Pt stays at [facility] for rehab x 1 week.  Last night he was standing in one spot and didn't move.  Today he is having difficulty talking, altered and globally weak.  Seen by mental health provider but sent here for further care.  Last known normal was yesterday.  Report not feeling well x 1 week.  Report new medication changes  Review of Systems  Positive: As above Negative: As above  Physical Exam  BP 129/79 (BP Location: Right Arm)   Pulse 89   Temp 98.7 F (37.1 C)   Resp 18   SpO2 100%  Gen:   Awake, no distress   Resp:  Normal effort  MSK:   Moves extremities without difficulty  Other:  Slow to response, neuro exam difficulty as it is inconsistent with poor strength, poor effort and sxs non localizing  Medical Decision Making  Medically screening exam initiated at 9:53 AM.  Appropriate orders placed.  XIAN ALVES was informed that the remainder of the evaluation will be completed by another provider, this initial triage assessment does not replace that evaluation, and the importance of remaining in the ED until their evaluation is complete.  Possible psych meds reaction.  Doubt stroke.  Work up initiated.  Unable to see updated medication list.     Fayrene Helper, Cordelia Poche 01/18/24 4098

## 2024-01-18 NOTE — ED Notes (Signed)
 Pt encouraged to eat and drink; pt aware urine sample is needed

## 2024-01-18 NOTE — ED Notes (Signed)
 Pt transported to CT ?

## 2024-01-18 NOTE — ED Notes (Signed)
 Attempted intermittent cath without success

## 2024-01-19 ENCOUNTER — Observation Stay (HOSPITAL_COMMUNITY)
Admit: 2024-01-19 | Discharge: 2024-01-19 | Disposition: A | Discharge status: Home / Self Care | Payer: Self-pay | Attending: Internal Medicine | Admitting: Internal Medicine

## 2024-01-19 DIAGNOSIS — R569 Unspecified convulsions: Secondary | ICD-10-CM

## 2024-01-19 DIAGNOSIS — R4182 Altered mental status, unspecified: Secondary | ICD-10-CM

## 2024-01-19 LAB — CBC
HCT: 38.4 % — ABNORMAL LOW (ref 39.0–52.0)
Hemoglobin: 12.7 g/dL — ABNORMAL LOW (ref 13.0–17.0)
MCH: 27.7 pg (ref 26.0–34.0)
MCHC: 33.1 g/dL (ref 30.0–36.0)
MCV: 83.8 fL (ref 80.0–100.0)
Platelets: 265 10*3/uL (ref 150–400)
RBC: 4.58 MIL/uL (ref 4.22–5.81)
RDW: 14.5 % (ref 11.5–15.5)
WBC: 7.7 10*3/uL (ref 4.0–10.5)
nRBC: 0 % (ref 0.0–0.2)

## 2024-01-19 LAB — COMPREHENSIVE METABOLIC PANEL WITH GFR
ALT: 31 U/L (ref 0–44)
AST: 45 U/L — ABNORMAL HIGH (ref 15–41)
Albumin: 3.2 g/dL — ABNORMAL LOW (ref 3.5–5.0)
Alkaline Phosphatase: 28 U/L — ABNORMAL LOW (ref 38–126)
Anion gap: 8 (ref 5–15)
BUN: 15 mg/dL (ref 8–23)
CO2: 25 mmol/L (ref 22–32)
Calcium: 8.6 mg/dL — ABNORMAL LOW (ref 8.9–10.3)
Chloride: 108 mmol/L (ref 98–111)
Creatinine, Ser: 1.01 mg/dL (ref 0.61–1.24)
GFR, Estimated: >60 mL/min (ref >60)
Glucose, Bld: 94 mg/dL (ref 70–99)
Potassium: 3.4 mmol/L — ABNORMAL LOW (ref 3.5–5.1)
Sodium: 141 mmol/L (ref 135–145)
Total Bilirubin: 0.5 mg/dL (ref 0.0–1.2)
Total Protein: 7.3 g/dL (ref 6.5–8.1)

## 2024-01-19 LAB — RAPID URINE DRUG SCREEN, HOSP PERFORMED
Amphetamines: NOT DETECTED
Barbiturates: NOT DETECTED
Benzodiazepines: NOT DETECTED
Cocaine: NOT DETECTED
Opiates: NOT DETECTED
Tetrahydrocannabinol: NOT DETECTED

## 2024-01-19 LAB — MAGNESIUM: Magnesium: 2.4 mg/dL (ref 1.7–2.4)

## 2024-01-19 LAB — HIV ANTIBODY (ROUTINE TESTING W REFLEX): HIV Screen 4th Generation wRfx: NONREACTIVE

## 2024-01-19 LAB — AMMONIA: Ammonia: 32 umol/L (ref 9–35)

## 2024-01-19 MED ORDER — POTASSIUM CHLORIDE 2 MEQ/ML IV SOLN
INTRAVENOUS | Status: AC
  Filled 2024-01-19 (×2): qty 1000

## 2024-01-19 MED ORDER — KCL IN DEXTROSE-NACL 20-5-0.9 MEQ/L-%-% IV SOLN
INTRAVENOUS | Status: DC
  Filled 2024-01-19 (×2): qty 1000

## 2024-01-19 MED ORDER — LORAZEPAM 2 MG/ML IJ SOLN
2.0000 mg | INTRAMUSCULAR | Status: DC
  Administered 2024-01-19 – 2024-01-20 (×7): 2 mg via INTRAVENOUS
  Filled 2024-01-19 (×7): qty 1

## 2024-01-19 MED ORDER — HALOPERIDOL LACTATE 5 MG/ML IJ SOLN: 2.0000 mg | Freq: Every day | INTRAMUSCULAR | Status: DC

## 2024-01-19 MED ORDER — POTASSIUM CHLORIDE 2 MEQ/ML IV SOLN
INTRAVENOUS | Status: DC
  Filled 2024-01-19: qty 1000

## 2024-01-19 MED ORDER — POTASSIUM CHLORIDE CRYS ER 20 MEQ PO TBCR
40.0000 meq | EXTENDED_RELEASE_TABLET | Freq: Once | ORAL | Status: DC
Start: 2024-01-19 — End: 2024-01-19

## 2024-01-19 NOTE — Progress Notes (Signed)
 EEG complete - results pending

## 2024-01-19 NOTE — Procedures (Signed)
 Patient Name: [name]  MRN: [id number]  Epilepsy Attending: Charlsie Quest  Referring Physician/Provider: Synetta Fail, MD  Date: 01/19/2024 Duration: 24.02 mins  Patient history: 63yo M with ams. EEG to evaluate for seizure  Level of alertness: Awake, asleep  AEDs during EEG study: Ativan  Technical aspects: This EEG study was done with scalp electrodes positioned according to the 10-20 International system of electrode placement. Electrical activity was reviewed with band pass filter of 1-70Hz , sensitivity of 7 uV/mm, display speed of 79mm/sec with a 60Hz  notched filter applied as appropriate. EEG data were recorded continuously and digitally stored.  Video monitoring was available and reviewed as appropriate.  Description: The posterior dominant rhythm consists of 9-10 Hz activity of moderate voltage (25-35 uV) seen predominantly in posterior head regions, symmetric and reactive to eye opening and eye closing. Sleep was characterized by vertex waves, sleep spindles (12 to 14 Hz), maximal frontocentral region. There is an excessive amount of 15 to 18 Hz beta activity  distributed symmetrically and diffusely. EEG also showed intermittent generalized 3-6Hz  theta-delta slowing. Hyperventilation and photic stimulation were not performed.     ABNORMALITY - Intermittent slow, generalized - Excessive beta, generalized  IMPRESSION: This study is suggestive of mild diffuse encephalopathy. The excessive beta activity seen in the background is most likely due to the effect of benzodiazepine and is a benign EEG pattern. No seizures or epileptiform discharges were seen throughout the recording.  Jaquin Coy Annabelle Harman

## 2024-01-19 NOTE — Plan of Care (Signed)
   Problem: Education: Goal: Knowledge of General Education information will improve Description: Including pain rating scale, medication(s)/side effects and non-pharmacologic comfort measures Outcome: Not Progressing

## 2024-01-19 NOTE — Consult Note (Signed)
 [facility] Psychiatric Consult Follow Up  Patient Name: [name]  MRN: [id number]  DOB: 1961-06-08  Consult Order details:  Orders (From admission, onward)     Start     Ordered   01/18/24 1426  CONSULT TO CALL ACT TEAM       Ordering Provider: Fayrene Helper, PA-C  Provider:  (Not yet assigned)  Question:  Reason for Consult?  Answer:  altered mental status   01/18/24 1425             Mode of Visit: In person    Psychiatry Consult Evaluation  Service Date: January 19, 2024 LOS:  LOS: 0 days  Chief Complaint: "Yeah, they keep asking me that"   Primary Psychiatric Diagnoses  AMS (altered mental status)  Assessment   Caleb Leblanc is a 63 y.o. AA male with no known past psychiatric history, and pertinent medical comorbidities/history that include 2021 hospitalization for unknown etiology altered mental status (I.e., unclear medical etiology metabolic encephalopathy versus secondary gain/factitiousness), who initially presented to the [facility] earlier today, for endorsements of needing his medications, who after being psychiatrically cleared from psychiatric evaluation, was recommended to follow-up with medical services, to rule out medical etiology causing patient's symptoms reported  by individual present with the patient (I.e., recent observation of bizarre behavior), who then arrived to the [facility] a few short hours later for further evaluation and care, that upon EDP evaluation, felt needed further evaluation and workup from psychiatry, thus psychiatry was consulted.  Per EDP team, patient is medically clear at this time, as well as voluntary.  Upon evaluation, patient presents with symptomology that is most consistent with altered mental status of unknown etiology at this time.  From evaluation conducted, there is no evidence currently of the patient presenting with endorsements of suicidal and or homicidal ideations, presenting with endorsements of instability in his mental  health in the form of depression and/or anxiety (I.e., psychomotor retardation), presenting with evidence of psychosis (I.e., appearing to be responding to external and/or internal stimuli; making endorsements of hearing or seeing things, expressing delusional themes, expressing paranoia, or expressing ideas of reference), appearing to be intoxicated on illicit substances and/or EtOH, and/or presenting with symptomology consistent with catatonia, upon formal [place]. During time of investigation however, UDS and UA repeatedly unable to be obtained by staff attempts, thus this still at this time has to be considered in the differential, though there is low clinical suspicion, given evaluation performed, as well as the patient's history and previous evaluations and care provided that is available upon chart review (See 2021 hospitalization).  Given the aforementioned, in addition to the patient's history upon chart review, do recommend at this time consider hospitalist and neurology consultation, as well as possibly obtaining additional studies, to rule out further medical etiologies.  Nonetheless however, please utilize low threshold for reconsultation to psychiatry, if there is clinical suspicion going forward for primary underlying psychiatric etiology.   Spoke with Dr. Enedina Finner who is in agreement with recommendations and plan to move forward at this time.  01/19/2024: The client was evaluated this morning and later in the afternoon.  Both times, he would not respond to verbal and tactile stimulation, including a light sternal rub.  His eyes never opened, no response.  This morning his nurse reported he has not eaten or drank today nor taken his medications.  She saw his eyes moving when closed and his lips slightly moving.  No actions on this provider's assessment.  When his arm was raised it was not rigid until the arm was being flexed, unable to complete this action due to resistance, no facial  expression.  He is incontinent with staff having to change him.  Remains lying on his back.  Past history of one other incident in 2021 with a 4-day hospital stay with suspicion of secondary gain as he was from jail at the time.  He was released from prison and was living at the [facility] who brought him to the ED as he was having bizarre behaviors.  There are no pending court dates and no secondary gain factors in play.    Discussed the case with the psychiatrist, Dr. Enedina Finner, who recommended the Ativan challenge as he also observed the same behavior from the patient.  The attending started IVF and the Ativan was ordered with the plan to try an antipsychotic tomorrow if there is no response.  Psych will continue to follow.  Diagnoses:  Active Hospital problems: Principal Problem:   AMS (altered mental status) Active Problems:   OSA (obstructive sleep apnea)   Morbid obesity due to excess calories (HCC)   HTN (hypertension)   Acute encephalopathy    Plan   # Altered mental status (unknown etiology) Vs. Catatonia  ## Psychiatric Recommendations:  -Ativan 2 mg IV every two hours, hold if sedated -Consider an antipsychotic tomorrow if the Ativan challenge is not successful  ## Medical Decision Making Capacity: Does not appear to have capacity  ## Further Work-up:   Could consider the following studies --> Ammonia, UA, UDS, EEG, MRI  ## Disposition:--TBD  ## Behavioral / Environmental: -Delirium Precautions: Delirium Interventions for Nursing and Staff: - RN to open blinds every AM. - To Bedside: Glasses, hearing aide, and pt's own shoes. Make available to patients. when possible and encourage use. - Encourage po fluids when appropriate, keep fluids within reach. - OOB to chair with meals. - Passive ROM exercises to all extremities with AM & PM care. - RN to assess orientation to person, time and place QAM and PRN. - Recommend extended visitation hours with familiar family/friends as  feasible. - Staff to minimize disturbances at night. Turn off television when pt asleep or when not in use.  ## Safety and Observation Level:  - Based on my clinical evaluation, I estimate the patient to be at moderate risk of self harm in the current setting. - At this time, we recommend  1:1 Observation. This decision is based on my review of the chart including patient's history and current presentation, interview of the patient, mental status examination, and consideration of suicide risk including evaluating suicidal ideation, plan, intent, suicidal or self-harm behaviors, risk factors, and protective factors. This judgment is based on our ability to directly address suicide risk, implement suicide prevention strategies, and develop a safety plan while the patient is in the clinical setting. Please contact our team if there is a concern that risk level has changed.  CSSR Risk Category:C-SSRS RISK CATEGORY: No Risk  Suicide Risk Assessment: Patient has following modifiable risk factors for suicide: None, which we are addressing by evaluation/recommendations. Patient has following non-modifiable or demographic risk factors for suicide: male gender Patient has the following protective factors against suicide: No psychiatric history  Thank you for this consult request. Recommendations have been communicated to the primary team.  We will continue to follow at this time.   Nanine Means, NP       History of Present Illness   Jessy  Caleb Leblanc is a 63 y.o. AA male with no known past psychiatric history, and pertinent medical comorbidities/history that include 2021 hospitalization for unknown etiology altered mental status (I.e., unclear medical etiology metabolic encephalopathy versus secondary gain/factitiousness), who initially presented to the [facility] earlier today, for endorsements of needing his medications, who after being psychiatrically cleared from psychiatric evaluation, was recommended to  follow-up with medical services, to rule out medical etiology causing patient's symptoms reported  by individual present with the patient (I.e., recent observation of bizarre behavior), who then arrived to the [facility] a few short hours later for further evaluation and care, that upon EDP evaluation, felt needed further evaluation and workup from psychiatry, thus psychiatry was consulted.  Per EDP team, patient is medically clear at this time, as well as voluntary.  Patient seen today at the [facility] emergency department for face-to-face psychiatric evaluation.  Upon evaluation, patient engagement is largely characterized by oddly related and atypical interpersonal style, with variable to brief eye contact, and oddly constricted affect, and an appreciable variable ability to answer questions asked (and/or is intentionally selective in answering this provider), despite frequent verbal prompting and reasking and/or restating of questions, but without appearing to be presenting with fluctuations of consciousness.   Patient asked what brought him in this encounter, to which patient states after pausing for a meaningful period of time, while appearing meaningfully oddly related and atypical, "yeah, they keep asking me that."  Patient asked again what brought him in this encounter, but gives no answer.  Patient asked about his evaluation at the [facility] a few short hours ago, and their recommendation to go to the emergency department for further medical workup, to which patient states, "yeah they did."  Patient asked if he went to the [facility] for any psychiatric complaints that he could articulate to this provider, but gives no answer for a meaningful period of time, then states, "I got stuff going on, so we came here".  Patient appreciably was psychiatrically cleared earlier today by [facility] provider; found no evidence of psychosis, suicidal and or homicidal expressions, depressive and/or anxious symptomology, and/or concerns  for safety.   Patient asked to expand on previous statement, then again pauses for a meaningful period time, and appears that he is not going to answer the question asked, then states, "I got pain".  Patient asked where he is hurting and/or experiencing medical complications, but despite numerous prompting, gives no answer.  Per chart review, patient appreciably presented with initial complaints of stomach pains. Patient asked orientation questions repeatedly, but patient unable to provide any answers, and/or will not, this is unclear (nursing reports however that the patient has been oriented upon all of their serial evaluations this encounter).   Patient asked if he is experiencing any depressive and/or anxious symptomology, states, "no, but I just got out of prison" (appear to be suggesting this as a psychosocial stressor, though will not/can't ellaborate). Patient asked about suicidal and or homicidal ideations, denies both of these, but when attempted to be asked about history of suicide attempts and/or self-injurious behavior, gives no answer.  Patient asked about experiencing auditory and or visual hallucinations, denies both of these, and objectively, does not appear to be responding to internal and/or external stimuli.  Patient asked about paranoia, tested for delusional themes, and tested for ideas of reference, but appreciably gives no endorsements of the aforementioned.  Patient objectively shows no signs of fear, appearing distressed, and/or paranoid.  Patient asked about illicit substance use, tobacco,  and/or EtOH, to which at this time, and very oddly, patient responds intently and sharply (curtly if you will), denying any illicit substance use, EtOH use, and/or tobacco.  Chart review reveals a history of smoking tobacco, but no drugs, and/or EtOH use; UDS/UA not obtained at this time, BAL unremarkable.  Patient asked to expand on why he went to prison, to which patient is able to articulate to  this writer that he has been in prison since 2021 "I believe", but despite numerous attempts, does not, and/or cannot, articulate to this provider why he was in prison since 2021.  Patient asked if the last time he has utilized hospital services has been since 2021 hospital encounter, to which she states, "yeah... I think it was".  Patient asked if he is on any medications, to which he superficially states that he is.  Patient asked if he has any mental health history, able to answer this provider and denies this.  Patient asked about any inpatient mental health hospitalizations, does not answer this.  Patient asked about any psychiatric care received in prison, does not answer this (per chart review, has never received mental health services in prison).   Patient attempted to be asked further questions, but begins not answering further questions. Formal Loni Beckwith performed, and patient is assessed for symptomology consistent with illicit substance use and/or EtOH withdrawal, which the patient test negative for both, then interview was terminated.  Per reports from earlier however, patient was brought in due to abrupt onset of altered mental status.  01/19/2024: The client did not verbal or physically respond to verbal and tactile stimulation.  He is not eating, drinking, or taking medications.  His eyes do not open with a light sternal rub.  No resistance to his arm being picked up but was not able to flex it due to resistance.  Review of Systems  Unable to perform ROS: Mental acuity (Limited participation)  Neurological:  Positive for weakness.  Psychiatric/Behavioral:  Negative for depression, hallucinations, substance abuse and suicidal ideas. The patient does not have insomnia.      Psychiatric and Social History  Psychiatric History:  Information collected from chart review  Prev Dx/Sx: None reported Current Psych Provider:  None reported Home Meds (current): None reported Previous  Med Trials: None reported Therapy: None reported  Prior Psych Hospitalization: None reported  Prior Self Harm: None reported Prior Violence: None reported  Family Psych History: None reported Family Hx suicide: None reported  Social History:  Developmental Hx: None reported Educational Hx: None reported Occupational Hx: None reported Legal Hx: Just got out of prison about a week ago Living Situation: Lives at [facility], per staff member report Spiritual Hx: None reported  Access to weapons/lethal means: None reported   Substance History Alcohol: : None reported  Type of alcohol : None reported Last Drink : None reported Number of drinks per day : None reported History of alcohol withdrawal seizures : None reported History of DT's : None reported Tobacco: History of smoking, none reported recently Illicit drugs: None reported Prescription drug abuse: None reported Rehab hx: None reported  Exam Findings  Physical Exam: As below Vital Signs:  Temp:  [97.9 F (36.6 C)-98.5 F (36.9 C)] 97.9 F (36.6 C) (04/11 0211) Pulse Rate:  [19-86] 74 (04/11 0211) Resp:  [14-19] 19 (04/11 0211) BP: (133-170)/(76-94) 170/90 (04/11 0211) SpO2:  [96 %-100 %] 96 % (04/10 2229) Blood pressure (!) 170/90, pulse 74, temperature 97.9 F (36.6 C), temperature  source Oral, resp. rate 19, SpO2 96%. There is no height or weight on file to calculate BMI.  Physical Exam Vitals and nursing note reviewed.  Constitutional:      General: He is not in acute distress.    Appearance: He is obese. He is not ill-appearing, toxic-appearing or diaphoretic.     Comments: Oddly related and atypical interpersonal style   Pulmonary:     Effort: Pulmonary effort is normal.  Skin:    General: Skin is warm and dry.  Neurological:     Motor: No tremor or seizure activity.  Psychiatric:        Attention and Perception: Perception normal. He is inattentive. He does not perceive auditory or visual  hallucinations.        Mood and Affect: Affect is inappropriate.        Speech: He is noncommunicative. Speech is delayed.        Behavior: Behavior is withdrawn.        Thought Content: Thought content is not paranoid or delusional. Thought content does not include homicidal or suicidal ideation.        Cognition and Memory: Cognition is impaired. Memory is impaired.    Mental Status Exam: General Appearance:  Atypical and oddly related interpersonal style  Orientation:  Other:  Unable to assess  Memory: Unable to assess  Concentration:  Concentration: Poor and Attention Span: Poor  Recall:   Unable to assess  Attention  Poor  Eye Contact:   Variable to brief  Speech: Abrupt, abnormal pattern  Language:   Variable  Volume:  Normal  Mood: "it's getting there"  Affect: Oddly constricted  Thought Process: Short, reserved, vague, odd  Thought Content: Largely logical but atypical  Suicidal Thoughts:  No  Homicidal Thoughts:  No  Judgement:  Impaired  Insight:   Acutely poor  Psychomotor Activity:  Normal  Akathisia:  No  Fund of Knowledge:   Unable to assess      Assets:  Desire for Improvement Housing Resilience Social Support Transportation  Cognition:  Impaired,  Mild  ADL's:  Impaired; reported to not be able to attend to  AIMS (if indicated):        Other History   These have been pulled in through the EMR, reviewed, and updated if appropriate.  Family History:  The patient's family history is not on file.  Medical History: Past Medical History:  Diagnosis Date   Hypertension     Surgical History: History reviewed. No pertinent surgical history.   Medications:   Current Facility-Administered Medications:    acetaminophen (TYLENOL) tablet 650 mg, 650 mg, Oral, Q6H PRN **OR** acetaminophen (TYLENOL) suppository 650 mg, 650 mg, Rectal, Q6H PRN, Synetta Fail, MD   amLODipine (NORVASC) tablet 10 mg, 10 mg, Oral, Daily, Synetta Fail, MD    enoxaparin (LOVENOX) injection 40 mg, 40 mg, Subcutaneous, Q24H, Synetta Fail, MD, 40 mg at 01/18/24 2130   polyethylene glycol (MIRALAX / GLYCOLAX) packet 17 g, 17 g, Oral, Daily PRN, Synetta Fail, MD   polyvinyl alcohol (LIQUIFILM TEARS) 1.4 % ophthalmic solution 1 drop, 1 drop, Both Eyes, PRN, Synetta Fail, MD   sodium chloride flush (NS) 0.9 % injection 3 mL, 3 mL, Intravenous, Q12H, Synetta Fail, MD, 3 mL at 01/18/24 2216  Allergies: Allergies  Allergen Reactions   Amoxicillin Anaphylaxis   Augmentin [Amoxicillin-Pot Clavulanate] Anaphylaxis    Nanine Means, NP

## 2024-01-19 NOTE — Progress Notes (Signed)
 Caleb Leblanc  MVH:[id number] DOB: 06/20/1961 DOA: 01/18/2024 PCP: Patient, No Pcp Per    Brief Narrative:  63 year old with a history of obesity, OSA, HTN, and recent incarceration who was transported to the ER from his halfway house after he was found by his roommate standing in one spot in his room not speaking or interacting.  On arrival in the ER the patient was not speaking nor would he move his arms or legs.  He had a history of a similar episode in 2021 at which time a full workup was unremarkable and ultimately malingering was felt to be the etiology.  At presentation vital signs were stable.  Labs were without severe derangement.  Ethanol level was negative.  CT head was without acute findings.  Psychiatry was consulted.  Goals of Care:   Code Status: Full Code   DVT prophylaxis: enoxaparin (LOVENOX) injection 40 mg Start: 01/18/24 2000   Interim Hx: Afebrile since admission.  Blood pressure intermittently elevated with systolic up to 170.  Vitals otherwise stable.  Remains completely unresponsive at time of my visit.  No evidence of instability whatsoever.  Respirations nonlabored.  No evidence of discomfort or distress.  Assessment & Plan:  Acute encephalopathy of unclear etiology CT head unrevealing -ethanol level negative -no clinical evidence of acute infection -psychiatry did not feel the patient represented a danger to himself, though patient did report symptoms to the admitting medical doctor consistent with possible psychosis -psychiatry giving an Ativan challenge for possible catatonia - TSH normal -CBGs normal -urine drug screen requested but not yet completed -ammonia normal  Mild hypokalemia Due to poor intake -supplement and follow  HTN Monitor trend without adjustment at present  Obesity   OSA Continue usual CPAP regimen nightly  Family Communication: No family present at time of exam Disposition: Unclear at present -will depend upon medical  progress   Objective: Blood pressure (!) 170/90, pulse 74, temperature 97.9 F (36.6 C), temperature source Oral, resp. rate 19, SpO2 96%.  Intake/Output Summary (Last 24 hours) at 01/19/2024 1118 Last data filed at 01/19/2024 0945 Gross per 24 hour  Intake 0 ml  Output 0 ml  Net 0 ml   There were no vitals filed for this visit.  Examination: General: No acute respiratory distress -unresponsive Lungs: Clear to auscultation bilaterally without wheezes or crackles Cardiovascular: Regular rate and rhythm without murmur gallop or rub normal S1 and S2 Abdomen: Nontender, nondistended, soft, bowel sounds positive, no rebound, no ascites, no appreciable mass Extremities: No significant cyanosis, clubbing, or edema bilateral lower extremities  CBC: Recent Labs  Lab 01/18/24 0956 01/18/24 1002 01/18/24 2341  WBC 7.5  --  7.7  NEUTROABS 4.8  --   --   HGB 14.4 15.3 12.7*  HCT 44.7 45.0 38.4*  MCV 85.3  --  83.8  PLT 267  --  265   Basic Metabolic Panel: Recent Labs  Lab 01/18/24 0956 01/18/24 1002 01/18/24 2341  NA 142 142 141  K 3.4* 3.5 3.4*  CL 105 108 108  CO2 24  --  25  GLUCOSE 92 93 94  BUN 20 22 15   CREATININE 1.12 1.10 1.01  CALCIUM 9.2  --  8.6*  MG  --   --  2.4   GFR: CrCl cannot be calculated (Unknown ideal weight.).   Scheduled Meds:  amLODipine  10 mg Oral Daily   enoxaparin (LOVENOX) injection  40 mg Subcutaneous Q24H   sodium chloride flush  3 mL Intravenous  Q12H      LOS: 0 days   Lonia Blood, MD [facility] Office  [phone] Pager - Text Page per Amion  If 7PM-7AM, please contact night-coverage per Amion 01/19/2024, 11:18 AM

## 2024-01-19 NOTE — Plan of Care (Signed)

## 2024-01-20 DIAGNOSIS — F061 Catatonic disorder due to known physiological condition: Secondary | ICD-10-CM | POA: Diagnosis present

## 2024-01-20 DIAGNOSIS — R404 Transient alteration of awareness: Secondary | ICD-10-CM

## 2024-01-20 LAB — CORTISOL: Cortisol, Plasma: 10.4 ug/dL

## 2024-01-20 LAB — BASIC METABOLIC PANEL WITH GFR
Anion gap: 8 (ref 5–15)
BUN: 12 mg/dL (ref 8–23)
CO2: 26 mmol/L (ref 22–32)
Calcium: 8.3 mg/dL — ABNORMAL LOW (ref 8.9–10.3)
Chloride: 106 mmol/L (ref 98–111)
Creatinine, Ser: 0.93 mg/dL (ref 0.61–1.24)
GFR, Estimated: >60 mL/min (ref >60)
Glucose, Bld: 83 mg/dL (ref 70–99)
Potassium: 3.6 mmol/L (ref 3.5–5.1)
Sodium: 140 mmol/L (ref 135–145)

## 2024-01-20 LAB — CBC
HCT: 42.4 % (ref 39.0–52.0)
Hemoglobin: 13.5 g/dL (ref 13.0–17.0)
MCH: 27.3 pg (ref 26.0–34.0)
MCHC: 31.8 g/dL (ref 30.0–36.0)
MCV: 85.7 fL (ref 80.0–100.0)
Platelets: 244 10*3/uL (ref 150–400)
RBC: 4.95 MIL/uL (ref 4.22–5.81)
RDW: 14.6 % (ref 11.5–15.5)
WBC: 6.8 10*3/uL (ref 4.0–10.5)
nRBC: 0 % (ref 0.0–0.2)

## 2024-01-20 LAB — AMMONIA: Ammonia: 19 umol/L (ref 9–35)

## 2024-01-20 LAB — VITAMIN B12: Vitamin B-12: 268 pg/mL (ref 180–914)

## 2024-01-20 LAB — FOLATE: Folate: 20.6 ng/mL (ref >5.9)

## 2024-01-20 MED ORDER — CYANOCOBALAMIN 1000 MCG/ML IJ SOLN: 1000.0000 ug | Freq: Every day | INTRAMUSCULAR | Status: DC

## 2024-01-20 MED ORDER — ORAL CARE MOUTH RINSE: 15.0000 mL | OROMUCOSAL | Status: DC | PRN

## 2024-01-20 MED ORDER — CYANOCOBALAMIN 1000 MCG/ML IJ SOLN
1000.0000 ug | Freq: Every day | INTRAMUSCULAR | Status: AC
  Administered 2024-01-20 – 2024-01-23 (×4): 1000 ug via SUBCUTANEOUS
  Filled 2024-01-20 (×4): qty 1

## 2024-01-20 MED ORDER — LORAZEPAM 2 MG/ML IJ SOLN: 1.0000 mg | Freq: Four times a day (QID) | INTRAMUSCULAR | Status: DC

## 2024-01-20 MED ORDER — THIAMINE HCL 100 MG/ML IJ SOLN
100.0000 mg | Freq: Every day | INTRAMUSCULAR | Status: AC
  Administered 2024-01-20 – 2024-01-22 (×3): 100 mg via INTRAVENOUS
  Filled 2024-01-20 (×3): qty 2

## 2024-01-20 NOTE — Consult Note (Addendum)
 [facility] Psychiatric Consult Follow Up  Patient Name: .Caleb Leblanc  MRN: [id number]  DOB: Jan 28, 1961  Consult Order details:  Orders (From admission, onward)     Start     Ordered   01/18/24 1426  CONSULT TO CALL ACT TEAM       Ordering Provider: Debbra Fairy, PA-C  Provider:  (Not yet assigned)  Question:  Reason for Consult?  Answer:  altered mental status   01/18/24 1425             Mode of Visit: In person    Psychiatry Consult Evaluation  Service Date: January 20, 2024 LOS:  LOS: 0 days  Chief Complaint: "Yeah, they keep asking me that"   Primary Psychiatric Diagnoses  AMS (altered mental status)  Assessment   Caleb Leblanc is a 63 y.o. AA male with no known past psychiatric history, and pertinent medical comorbidities/history that include 2021 hospitalization for unknown etiology altered mental status (I.e., unclear medical etiology metabolic encephalopathy versus secondary gain/factitiousness), who initially presented to the [facility] earlier today, for endorsements of needing his medications, who after being psychiatrically cleared from psychiatric evaluation, was recommended to follow-up with medical services, to rule out medical etiology causing patient's symptoms reported  by individual present with the patient (I.e., recent observation of bizarre behavior), who then arrived to the [facility] a few short hours later for further evaluation and care, that upon EDP evaluation, felt needed further evaluation and workup from psychiatry, thus psychiatry was consulted.  Per EDP team, patient is medically clear at this time, as well as voluntary.  Upon evaluation, patient presents with symptomology that is most consistent with altered mental status of unknown etiology at this time.  From evaluation conducted, there is no evidence currently of the patient presenting with endorsements of suicidal and or homicidal ideations, presenting with endorsements of instability in his mental  health in the form of depression and/or anxiety (I.e., psychomotor retardation), presenting with evidence of psychosis (I.e., appearing to be responding to external and/or internal stimuli; making endorsements of hearing or seeing things, expressing delusional themes, expressing paranoia, or expressing ideas of reference), appearing to be intoxicated on illicit substances and/or EtOH, and/or presenting with symptomology consistent with catatonia, upon formal [place]. During time of investigation however, UDS and UA repeatedly unable to be obtained by staff attempts, thus this still at this time has to be considered in the differential, though there is low clinical suspicion, given evaluation performed, as well as the patient's history and previous evaluations and care provided that is available upon chart review (See 2021 hospitalization).  Given the aforementioned, in addition to the patient's history upon chart review, do recommend at this time consider hospitalist and neurology consultation, as well as possibly obtaining additional studies, to rule out further medical etiologies.  Nonetheless however, please utilize low threshold for reconsultation to psychiatry, if there is clinical suspicion going forward for primary underlying psychiatric etiology.   Spoke with Dr. Deborah Falling who is in agreement with recommendations and plan to move forward at this time.  01/19/2024: The client was evaluated this morning and later in the afternoon.  Both times, he would not respond to verbal and tactile stimulation, including a light sternal rub.  His eyes never opened, no response.  This morning his nurse reported he has not eaten or drank today nor taken his medications.  She saw his eyes moving when closed and his lips slightly moving.  No actions on this provider's assessment.  When his arm was raised it was not rigid until the arm was being flexed, unable to complete this action due to resistance, no facial  expression.  He is incontinent with staff having to change him.  Remains lying on his back.  Past history of one other incident in 2021 with a 4-day hospital stay with suspicion of secondary gain as he was from jail at the time.  He was released from prison and was living at the [facility] who brought him to the ED as he was having bizarre behaviors.  There are no pending court dates and no secondary gain factors in play.    Discussed the case with the psychiatrist, Dr. Deborah Falling, who recommended the Ativan challenge as he also observed the same behavior from the patient.  The attending started IVF and the Ativan was ordered with the plan to try an antipsychotic tomorrow if there is no response.  Psych will continue to follow.  01/20/2024:  The patient was seen and reevaluated today.  Chart was reviewed again.  The patient does have a very unusual presentation.  He has received a Ativan challenge and over the last 24 hours has received a total of 7 doses of Ativan 2 mg IV.  He appeared to be sedated and after the last dose Ativan was held because he appeared obtunded.  No response so far.  On examination today the patient is lying in bed, nonresponsive.  He did not respond to verbal or tactile stimuli.  After some latency he did attempt to open his eyes.  There is no facial expression.  No response or deep stimuli. The plan is as noted below.  Diagnoses:  Active Hospital problems: Principal Problem:   AMS (altered mental status) Active Problems:   OSA (obstructive sleep apnea)   Morbid obesity due to excess calories (HCC)   HTN (hypertension)   Acute encephalopathy    Plan   # Altered mental status (unknown etiology) Vs. Catatonia The differential diagnosis for a nonresponsive patient with catatonic features needs to be considered.  These include encephalopathy, focal cerebral lesions, delirium, epilepsy, autoantibody encephalitis, serotonin syndrome, neuroleptic malignant syndrome, acute  psychosis and of course catatonia. So far, the workup has been negative although limited due to inability to get all the lab work done.  Recommend continuing follow-up. EEG has been completed and results indicate mild diffuse encephalopathy.  No seizure or epileptiform discharges were seen throughout the recording.  ## Psychiatric Recommendations:  -Ativan 2 mg IV every two hours, the patient has received a total of 7 doses so far with no response.  We will hold Ativan and once he clears up we will see if patient becomes responsive.  Then will use PRN today.  - Need Creatinine Kinase to rule out any NMS symptoms, although unlikely is better to rule out. - NMDA challenge to include Amantadine or Memantine -Antipsychotic drugs like Zyprexa or haldol once NMS is ruled out.    ## Medical Decision Making Capacity: Does not appear to have capacity  ## Further Work-up:   Could consider the following studies --> Ammonia, UA, UDS, EEG, MRI, CK  ## Disposition:--TBD  ## Behavioral / Environmental: -Delirium Precautions: Delirium Interventions for Nursing and Staff: - RN to open blinds every AM. - To Bedside: Glasses, hearing aide, and pt's own shoes. Make available to patients. when possible and encourage use. - Encourage po fluids when appropriate, keep fluids within reach. - OOB to chair with meals. - Passive ROM exercises  to all extremities with AM & PM care. - RN to assess orientation to person, time and place QAM and PRN. - Recommend extended visitation hours with familiar family/friends as feasible. - Staff to minimize disturbances at night. Turn off television when pt asleep or when not in use.  ## Safety and Observation Level:  - Based on my clinical evaluation, I estimate the patient to be at moderate risk of self harm in the current setting. - At this time, we recommend  1:1 Observation. This decision is based on my review of the chart including patient's history and current presentation,  interview of the patient, mental status examination, and consideration of suicide risk including evaluating suicidal ideation, plan, intent, suicidal or self-harm behaviors, risk factors, and protective factors. This judgment is based on our ability to directly address suicide risk, implement suicide prevention strategies, and develop a safety plan while the patient is in the clinical setting. Please contact our team if there is a concern that risk level has changed.  CSSR Risk Category:C-SSRS RISK CATEGORY: No Risk  Suicide Risk Assessment: Patient has following modifiable risk factors for suicide: None, which we are addressing by evaluation/recommendations. Patient has following non-modifiable or demographic risk factors for suicide: male gender Patient has the following protective factors against suicide: No psychiatric history  Thank you for this consult request. Recommendations have been communicated to the primary team.  We will continue to follow at this time.   Buzz Cass, MD       History of Present Illness   Caleb Leblanc is a 63 y.o. AA male with no known past psychiatric history, and pertinent medical comorbidities/history that include 2021 hospitalization for unknown etiology altered mental status (I.e., unclear medical etiology metabolic encephalopathy versus secondary gain/factitiousness), who initially presented to the [facility] earlier today, for endorsements of needing his medications, who after being psychiatrically cleared from psychiatric evaluation, was recommended to follow-up with medical services, to rule out medical etiology causing patient's symptoms reported  by individual present with the patient (I.e., recent observation of bizarre behavior), who then arrived to the [facility] a few short hours later for further evaluation and care, that upon EDP evaluation, felt needed further evaluation and workup from psychiatry, thus psychiatry was consulted.  Per EDP team, patient is  medically clear at this time, as well as voluntary.  Patient seen today at the [facility] emergency department for face-to-face psychiatric evaluation.  Upon evaluation, patient engagement is largely characterized by oddly related and atypical interpersonal style, with variable to brief eye contact, and oddly constricted affect, and an appreciable variable ability to answer questions asked (and/or is intentionally selective in answering this provider), despite frequent verbal prompting and reasking and/or restating of questions, but without appearing to be presenting with fluctuations of consciousness.   Patient asked what brought him in this encounter, to which patient states after pausing for a meaningful period of time, while appearing meaningfully oddly related and atypical, "yeah, they keep asking me that."  Patient asked again what brought him in this encounter, but gives no answer.  Patient asked about his evaluation at the [facility] a few short hours ago, and their recommendation to go to the emergency department for further medical workup, to which patient states, "yeah they did."  Patient asked if he went to the [facility] for any psychiatric complaints that he could articulate to this provider, but gives no answer for a meaningful period of time, then states, "I got stuff going on, so we  came here".  Patient appreciably was psychiatrically cleared earlier today by [facility] provider; found no evidence of psychosis, suicidal and or homicidal expressions, depressive and/or anxious symptomology, and/or concerns for safety.   Patient asked to expand on previous statement, then again pauses for a meaningful period time, and appears that he is not going to answer the question asked, then states, "I got pain".  Patient asked where he is hurting and/or experiencing medical complications, but despite numerous prompting, gives no answer.  Per chart review, patient appreciably presented with initial complaints of stomach  pains. Patient asked orientation questions repeatedly, but patient unable to provide any answers, and/or will not, this is unclear (nursing reports however that the patient has been oriented upon all of their serial evaluations this encounter).   Patient asked if he is experiencing any depressive and/or anxious symptomology, states, "no, but I just got out of prison" (appear to be suggesting this as a psychosocial stressor, though will not/can't ellaborate). Patient asked about suicidal and or homicidal ideations, denies both of these, but when attempted to be asked about history of suicide attempts and/or self-injurious behavior, gives no answer.  Patient asked about experiencing auditory and or visual hallucinations, denies both of these, and objectively, does not appear to be responding to internal and/or external stimuli.  Patient asked about paranoia, tested for delusional themes, and tested for ideas of reference, but appreciably gives no endorsements of the aforementioned.  Patient objectively shows no signs of fear, appearing distressed, and/or paranoid.  Patient asked about illicit substance use, tobacco, and/or EtOH, to which at this time, and very oddly, patient responds intently and sharply (curtly if you will), denying any illicit substance use, EtOH use, and/or tobacco.  Chart review reveals a history of smoking tobacco, but no drugs, and/or EtOH use; UDS/UA not obtained at this time, BAL unremarkable.  Patient asked to expand on why he went to prison, to which patient is able to articulate to this writer that he has been in prison since 2021 "I believe", but despite numerous attempts, does not, and/or cannot, articulate to this provider why he was in prison since 2021.  Patient asked if the last time he has utilized hospital services has been since 2021 hospital encounter, to which she states, "yeah... I think it was".  Patient asked if he is on any medications, to which he superficially states  that he is.  Patient asked if he has any mental health history, able to answer this provider and denies this.  Patient asked about any inpatient mental health hospitalizations, does not answer this.  Patient asked about any psychiatric care received in prison, does not answer this (per chart review, has never received mental health services in prison).   Patient attempted to be asked further questions, but begins not answering further questions. Formal [place] performed, and patient is assessed for symptomology consistent with illicit substance use and/or EtOH withdrawal, which the patient test negative for both, then interview was terminated.  Per reports from earlier however, patient was brought in due to abrupt onset of altered mental status.  01/19/2024: The client did not verbal or physically respond to verbal and tactile stimulation.  He is not eating, drinking, or taking medications.  His eyes do not open with a light sternal rub.  No resistance to his arm being picked up but was not able to flex it due to resistance. 01/20/2024: The patient is essentially nonresponsive despite receiving 14 mg of Ativan IV over the last  24 hours.  No response to verbal or tactile stimuli  Review of Systems  Unable to perform ROS: Mental acuity (Limited participation)  Neurological:  Positive for weakness.  Psychiatric/Behavioral:  Negative for depression, hallucinations, substance abuse and suicidal ideas. The patient does not have insomnia.      Psychiatric and Social History  Psychiatric History:  Information collected from chart review  Prev Dx/Sx: None reported Current Psych Provider:  None reported Home Meds (current): None reported Previous Med Trials: None reported Therapy: None reported  Prior Psych Hospitalization: None reported  Prior Self Harm: None reported Prior Violence: None reported  Family Psych History: None reported Family Hx suicide: None reported  Social History:   Developmental Hx: None reported Educational Hx: None reported Occupational Hx: None reported Legal Hx: Just got out of prison about a week ago Living Situation: Lives at [facility], per staff member report Spiritual Hx: None reported  Access to weapons/lethal means: None reported   Substance History Alcohol: : None reported  Type of alcohol : None reported Last Drink : None reported Number of drinks per day : None reported History of alcohol withdrawal seizures : None reported History of DT's : None reported Tobacco: History of smoking, none reported recently Illicit drugs: None reported Prescription drug abuse: None reported Rehab hx: None reported  Exam Findings  Physical Exam: As below Vital Signs:  Temp:  [97.4 F (36.3 C)-99 F (37.2 C)] 97.7 F (36.5 C) (04/12 0608) Pulse Rate:  [70-79] 70 (04/12 0608) Resp:  [18-19] 18 (04/12 0608) BP: (137-153)/(74-95) 143/95 (04/12 0608) SpO2:  [96 %-99 %] 99 % (04/12 0608) Blood pressure (!) 143/95, pulse 70, temperature 97.7 F (36.5 C), temperature source Oral, resp. rate 18, SpO2 99%. There is no height or weight on file to calculate BMI.  Physical Exam Vitals and nursing note reviewed.  Constitutional:      General: He is not in acute distress.    Appearance: He is obese. He is not ill-appearing, toxic-appearing or diaphoretic.     Comments: Oddly related and atypical interpersonal style   Pulmonary:     Effort: Pulmonary effort is normal.  Skin:    General: Skin is warm and dry.  Neurological:     Motor: No tremor or seizure activity.  Psychiatric:        Attention and Perception: Perception normal. He is inattentive. He does not perceive auditory or visual hallucinations.        Mood and Affect: Affect is inappropriate.        Speech: He is noncommunicative. Speech is delayed.        Behavior: Behavior is withdrawn.        Thought Content: Thought content is not paranoid or delusional. Thought content does not  include homicidal or suicidal ideation.        Cognition and Memory: Cognition is impaired. Memory is impaired.    Mental Status Exam: General Appearance:  Atypical and oddly related interpersonal style  Orientation:  Other:  Unable to assess  Memory: Unable to assess  Concentration:  Concentration: Poor and Attention Span: Poor  Recall:   Unable to assess  Attention  Poor  Eye Contact:   Variable to brief  Speech: Abrupt, abnormal pattern  Language:   Variable  Volume:  Normal  Mood: "it's getting there"  Affect: Oddly constricted  Thought Process: Short, reserved, vague, odd  Thought Content: Largely logical but atypical  Suicidal Thoughts:  No  Homicidal Thoughts:  No  Judgement:  Impaired  Insight:   Acutely poor  Psychomotor Activity:  Normal  Akathisia:  No  Fund of Knowledge:   Unable to assess      Assets:  Desire for Improvement Housing Resilience Social Support Transportation  Cognition:  Impaired,  Mild  ADL's:  Impaired; reported to not be able to attend to  AIMS (if indicated):        Other History   These have been pulled in through the EMR, reviewed, and updated if appropriate.  Family History:  The patient's family history is not on file.  Medical History: Past Medical History:  Diagnosis Date   Hypertension     Surgical History: History reviewed. No pertinent surgical history.   Medications:   Current Facility-Administered Medications:    acetaminophen (TYLENOL) tablet 650 mg, 650 mg, Oral, Q6H PRN **OR** [DISCONTINUED] acetaminophen (TYLENOL) suppository 650 mg, 650 mg, Rectal, Q6H PRN, Melvin, Alexander B, MD   amLODipine (NORVASC) tablet 10 mg, 10 mg, Oral, Daily, Melvin, Alexander B, MD   dextrose 5 % and 0.9 % NaCl 1,000 mL with potassium chloride 20 mEq infusion, , Intravenous, Continuous, Abbe Abate, MD, Last Rate: 75 mL/hr at 01/19/24 2040, New Bag at 01/19/24 2040   enoxaparin (LOVENOX) injection 40 mg, 40 mg, Subcutaneous,  Q24H, Melvin, Alexander B, MD, 40 mg at 01/19/24 1957   LORazepam (ATIVAN) injection 2 mg, 2 mg, Intravenous, Q2H, Lord, Jamison Y, NP, 2 mg at 01/20/24 0411   Oral care mouth rinse, 15 mL, Mouth Rinse, PRN, Abbe Abate, MD   polyethylene glycol (MIRALAX / GLYCOLAX) packet 17 g, 17 g, Oral, Daily PRN, Melvin, Alexander B, MD   polyvinyl alcohol (LIQUIFILM TEARS) 1.4 % ophthalmic solution 1 drop, 1 drop, Both Eyes, PRN, Melvin, Alexander B, MD   sodium chloride flush (NS) 0.9 % injection 3 mL, 3 mL, Intravenous, Q12H, Melvin, Alexander B, MD, 3 mL at 01/18/24 2216  Allergies: Allergies  Allergen Reactions   Amoxicillin Anaphylaxis   Augmentin [Amoxicillin-Pot Clavulanate] Anaphylaxis    Buzz Cass, MD

## 2024-01-20 NOTE — Progress Notes (Signed)
 Caleb Leblanc  ZOX:[id number] DOB: 04/08/1961 DOA: 01/18/2024 PCP: Patient, No Pcp Per    Brief Narrative:  63 year old with a history of obesity, OSA, HTN, and recent incarceration who was transported to the ER from his halfway house after he was found by his roommate standing in one spot in his room not speaking or interacting.  On arrival in the ER the patient was not speaking nor would he move his arms or legs.  He had a history of a similar episode in 2021 at which time a full workup was unremarkable and ultimately malingering was felt to be the etiology.  At presentation vital signs were stable.  Labs were without severe derangement.  Ethanol level was negative.  CT head was without acute findings.  Psychiatry was consulted.  Goals of Care:   Code Status: Full Code   DVT prophylaxis: enoxaparin (LOVENOX) injection 40 mg Start: 01/18/24 2000   Interim Hx: The patient remains obtunded.  He underwent an Ativan challenge yesterday/overnight with a total of 14 mg of Ativan administered and no change in his level of consciousness/responsiveness.  He had an EEG yesterday which was essentially unrevealing.  Urine drug screen was accomplished yesterday and was unrevealing.  He is afebrile.  Vital signs remained stable.  Assessment & Plan:  Acute encephalopathy of unclear etiology CT head unrevealing -ethanol level negative -UDS negative -EEG unrevealing - no clinical evidence of acute infection -no response with Ativan challenge for possible catatonia - TSH normal -CBGs normal - ammonia normal -cortisol normal -folic acid not low -B12 somewhat low at 268 given this clinical scenario  Modest B12 deficiency In this clinical setting I would prefer his B12 level be 400 or > -initiate subcutaneous supplementation -will need follow-up in outpatient setting in 6-8 weeks  Mild hypokalemia Corrected with supplementation  HTN Monitor trend without adjustment at present  Obesity   OSA Continue  usual CPAP regimen nightly  Family Communication: No family present at time of exam Disposition: Unclear at present -will depend upon medical progress   Objective: Blood pressure 133/86, pulse 75, temperature 98.4 F (36.9 C), temperature source Oral, resp. rate 16, SpO2 99%.  Intake/Output Summary (Last 24 hours) at 01/20/2024 1621 Last data filed at 01/20/2024 1500 Gross per 24 hour  Intake 700.35 ml  Output 800 ml  Net -99.65 ml   There were no vitals filed for this visit.  Examination: General: No acute respiratory distress -unresponsive Lungs: Clear to auscultation bilaterally  Cardiovascular: Regular rate and rhythm Abdomen: Soft, bowel sounds positive, no rebound Extremities: No significant cyanosis, clubbing, or edema bilateral lower extremities  CBC: Recent Labs  Lab 01/18/24 0956 01/18/24 1002 01/18/24 2341 01/20/24 1334  WBC 7.5  --  7.7 6.8  NEUTROABS 4.8  --   --   --   HGB 14.4 15.3 12.7* 13.5  HCT 44.7 45.0 38.4* 42.4  MCV 85.3  --  83.8 85.7  PLT 267  --  265 244   Basic Metabolic Panel: Recent Labs  Lab 01/18/24 0956 01/18/24 1002 01/18/24 2341 01/20/24 1334  NA 142 142 141 140  K 3.4* 3.5 3.4* 3.6  CL 105 108 108 106  CO2 24  --  25 26  GLUCOSE 92 93 94 83  BUN 20 22 15 12   CREATININE 1.12 1.10 1.01 0.93  CALCIUM 9.2  --  8.6* 8.3*  MG  --   --  2.4  --    GFR: CrCl cannot be calculated (Unknown  ideal weight.).   Scheduled Meds:  amLODipine  10 mg Oral Daily   enoxaparin (LOVENOX) injection  40 mg Subcutaneous Q24H   LORazepam  1 mg Intravenous Q6H   sodium chloride flush  3 mL Intravenous Q12H     LOS: 0 days   Caleb Abate, MD [facility] Office  [phone] Pager - Text Page per Tilford Foley  If 7PM-7AM, please contact night-coverage per Amion 01/20/2024, 4:21 PM

## 2024-01-21 LAB — COMPREHENSIVE METABOLIC PANEL WITH GFR
ALT: 27 U/L (ref 0–44)
AST: 22 U/L (ref 15–41)
Albumin: 2.8 g/dL — ABNORMAL LOW (ref 3.5–5.0)
Alkaline Phosphatase: 28 U/L — ABNORMAL LOW (ref 38–126)
Anion gap: 9 (ref 5–15)
BUN: 11 mg/dL (ref 8–23)
CO2: 25 mmol/L (ref 22–32)
Calcium: 8.3 mg/dL — ABNORMAL LOW (ref 8.9–10.3)
Chloride: 110 mmol/L (ref 98–111)
Creatinine, Ser: 1.01 mg/dL (ref 0.61–1.24)
GFR, Estimated: >60 mL/min (ref >60)
Glucose, Bld: 79 mg/dL (ref 70–99)
Potassium: 3.6 mmol/L (ref 3.5–5.1)
Sodium: 144 mmol/L (ref 135–145)
Total Bilirubin: 0.7 mg/dL (ref 0.0–1.2)
Total Protein: 7.3 g/dL (ref 6.5–8.1)

## 2024-01-21 LAB — RPR: RPR Ser Ql: NONREACTIVE

## 2024-01-21 LAB — CK: Total CK: 352 U/L (ref 49–397)

## 2024-01-21 LAB — MAGNESIUM: Magnesium: 2.4 mg/dL (ref 1.7–2.4)

## 2024-01-21 MED ORDER — LORAZEPAM 2 MG/ML IJ SOLN: 1.0000 mg | Freq: Four times a day (QID) | INTRAMUSCULAR | Status: DC | PRN

## 2024-01-21 NOTE — Progress Notes (Signed)
 Caleb Leblanc  ION:[id number] DOB: Aug 16, 1961 DOA: 01/18/2024 PCP: Patient, No Pcp Per    Brief Narrative:  63 year old with a history of obesity, OSA, HTN, and recent incarceration who was transported to the ER from his halfway house after he was found by his roommate standing in one spot in his room not speaking or interacting.  On arrival in the ER the patient was not speaking nor would he move his arms or legs.  He had a history of a similar episode in 2021 at which time a full workup was unremarkable and ultimately malingering was felt to be the etiology.  At presentation vital signs were stable.  Labs were without severe derangement.  Ethanol level was negative.  CT head was without acute findings.  Psychiatry was consulted.  Goals of Care:   Code Status: Full Code   DVT prophylaxis: enoxaparin (LOVENOX) injection 40 mg Start: 01/18/24 2000   Interim Hx: Patient has begun to wake up today.  No acute events were reported overnight.  He is afebrile and his vital signs are stable.  He will open his eyes and follow me during the exam.  He is very slow to respond but does not fact respond to some of my questions.  He does not appear to be in any acute distress.  He denies uncontrolled pain.  Assessment & Plan:  Acute encephalopathy of unclear etiology CT head unrevealing -ethanol level negative -UDS negative -EEG unrevealing - no clinical evidence of acute infection - Ativan challenge given for possible catatonia with patient receiving a total of 14 mg of IV Ativan - TSH normal -CBGs normal - ammonia normal -cortisol normal -folic acid not low -B12 somewhat low at 268 given this clinical scenario -continue to monitor with slow clinical improvement  Modest B12 deficiency In this clinical setting I would prefer his B12 level be 400 or > -continue subcutaneous supplementation - will need follow-up in outpatient setting in 6-8 weeks  Mild hypokalemia Corrected with supplementation  HTN No  indication for further adjustment today  Obesity   OSA Continue usual CPAP regimen nightly  Family Communication: No family present at time of exam Disposition: Unclear at present -will depend upon medical progress   Objective: Blood pressure (!) 143/76, pulse 69, temperature 99 F (37.2 C), temperature source Oral, resp. rate 18, SpO2 97%.  Intake/Output Summary (Last 24 hours) at 01/21/2024 0947 Last data filed at 01/21/2024 0500 Gross per 24 hour  Intake 0 ml  Output 950 ml  Net -950 ml   There were no vitals filed for this visit.  Examination: General: No acute respiratory distress -unresponsive Lungs: Clear to auscultation bilaterally  Cardiovascular: RRR Abdomen: Soft, bowel sounds positive, no rebound Extremities: No significant cyanosis, clubbing, or edema bilateral lower extremities  CBC: Recent Labs  Lab 01/18/24 0956 01/18/24 1002 01/18/24 2341 01/20/24 1334  WBC 7.5  --  7.7 6.8  NEUTROABS 4.8  --   --   --   HGB 14.4 15.3 12.7* 13.5  HCT 44.7 45.0 38.4* 42.4  MCV 85.3  --  83.8 85.7  PLT 267  --  265 244   Basic Metabolic Panel: Recent Labs  Lab 01/18/24 0956 01/18/24 1002 01/18/24 2341 01/20/24 1334  NA 142 142 141 140  K 3.4* 3.5 3.4* 3.6  CL 105 108 108 106  CO2 24  --  25 26  GLUCOSE 92 93 94 83  BUN 20 22 15 12   CREATININE 1.12 1.10 1.01 0.93  CALCIUM 9.2  --  8.6* 8.3*  MG  --   --  2.4  --    GFR: CrCl cannot be calculated (Unknown ideal weight.).   Scheduled Meds:  amLODipine  10 mg Oral Daily   cyanocobalamin  1,000 mcg Subcutaneous Daily   enoxaparin (LOVENOX) injection  40 mg Subcutaneous Q24H   LORazepam  1 mg Intravenous Q6H   sodium chloride flush  3 mL Intravenous Q12H   thiamine (VITAMIN B1) injection  100 mg Intravenous Daily     LOS: 1 day   Abbe Abate, MD [facility] Office  [phone] Pager - Text Page per Tilford Foley  If 7PM-7AM, please contact night-coverage per Amion 01/21/2024, 9:47  AM

## 2024-01-21 NOTE — Consult Note (Addendum)
 [facility] Psychiatric Consult Follow Up  Patient Name: .Caleb Leblanc  MRN: [id number]  DOB: 1961-05-04  Consult Order details:  Orders (From admission, onward)     Start     Ordered   01/18/24 1426  CONSULT TO CALL ACT TEAM       Ordering Provider: Debbra Fairy, PA-C  Provider:  (Not yet assigned)  Question:  Reason for Consult?  Answer:  altered mental status   01/18/24 1425             Mode of Visit: In person    Psychiatry Consult Evaluation  Service Date: January 21, 2024 LOS:  LOS: 1 day  Chief Complaint: "denied any today"  Primary Psychiatric Diagnoses  AMS (altered mental status)  Assessment   Caleb Leblanc is a 63 y.o. AA male with no known past psychiatric history, and pertinent medical comorbidities/history that include 2021 hospitalization for unknown etiology altered mental status (I.e., unclear medical etiology metabolic encephalopathy versus secondary gain/factitiousness), who initially presented to the [facility] earlier today, for endorsements of needing his medications, who after being psychiatrically cleared from psychiatric evaluation, was recommended to follow-up with medical services, to rule out medical etiology causing patient's symptoms reported  by individual present with the patient (I.e., recent observation of bizarre behavior), who then arrived to the [facility] a few short hours later for further evaluation and care, that upon EDP evaluation, felt needed further evaluation and workup from psychiatry, thus psychiatry was consulted.  Per EDP team, patient is medically clear at this time, as well as voluntary.  Upon evaluation, patient presents with symptomology that is most consistent with altered mental status of unknown etiology at this time.  From evaluation conducted, there is no evidence currently of the patient presenting with endorsements of suicidal and or homicidal ideations, presenting with endorsements of instability in his mental health in the form  of depression and/or anxiety (I.e., psychomotor retardation), presenting with evidence of psychosis (I.e., appearing to be responding to external and/or internal stimuli; making endorsements of hearing or seeing things, expressing delusional themes, expressing paranoia, or expressing ideas of reference), appearing to be intoxicated on illicit substances and/or EtOH, and/or presenting with symptomology consistent with catatonia, upon formal [place]. During time of investigation however, UDS and UA repeatedly unable to be obtained by staff attempts, thus this still at this time has to be considered in the differential, though there is low clinical suspicion, given evaluation performed, as well as the patient's history and previous evaluations and care provided that is available upon chart review (See 2021 hospitalization).  Given the aforementioned, in addition to the patient's history upon chart review, do recommend at this time consider hospitalist and neurology consultation, as well as possibly obtaining additional studies, to rule out further medical etiologies.  Nonetheless however, please utilize low threshold for reconsultation to psychiatry, if there is clinical suspicion going forward for primary underlying psychiatric etiology.   Spoke with Dr. Deborah Falling who is in agreement with recommendations and plan to move forward at this time.  01/19/2024: The client was evaluated this morning and later in the afternoon.  Both times, he would not respond to verbal and tactile stimulation, including a light sternal rub.  His eyes never opened, no response.  This morning his nurse reported he has not eaten or drank today nor taken his medications.  She saw his eyes moving when closed and his lips slightly moving.  No actions on this provider's assessment.  When his arm was  raised it was not rigid until the arm was being flexed, unable to complete this action due to resistance, no facial expression.  He is  incontinent with staff having to change him.  Remains lying on his back.  Past history of one other incident in 2021 with a 4-day hospital stay with suspicion of secondary gain as he was from jail at the time.  He was released from prison and was living at the [facility] who brought him to the ED as he was having bizarre behaviors.  There are no pending court dates and no secondary gain factors in play.    Discussed the case with the psychiatrist, Dr. Deborah Falling, who recommended the Ativan challenge as he also observed the same behavior from the patient.  The attending started IVF and the Ativan was ordered with the plan to try an antipsychotic tomorrow if there is no response.  Psych will continue to follow.  01/20/2024:  The patient was seen and reevaluated today.  Chart was reviewed again.  The patient does have a very unusual presentation.  He has received a Ativan challenge and over the last 24 hours has received a total of 7 doses of Ativan 2 mg IV.  He appeared to be sedated and after the last dose Ativan was held because he appeared obtunded.  No response so far.  On examination today the patient is lying in bed, nonresponsive.  He did not respond to verbal or tactile stimuli.  After some latency he did attempt to open his eyes.  There is no facial expression.  No response or deep stimuli. The plan is as noted below.  01/21/2024: The patient had received a total of 14 mg of Ativan IV which was discontinued yesterday.  Some of the labs are still pending and his CK has not yet been released.  The patient was seen today he was lying in bed and did respond to verbal stimuli.  He was able to open his eyes and communicate.  His speech was of low volume with some latency but he was able to report that he slept okay but is now tired.  He denies any auditory or visual hallucinations and denies any active suicidal ideations.  He denies depression today and he is oriented to month here but not to place.  He  denies any acute problems at the present time.  It is likely that once Ativan wore off he did respond to the Ativan challenge.  We will continue to monitor and once the labs are back may consider a low-dose antipsychotic.  His QTc be he is 471 on  01/18/2024.  Diagnoses:  Active Hospital problems: Principal Problem:   AMS (altered mental status) Active Problems:   OSA (obstructive sleep apnea)   Morbid obesity due to excess calories (HCC)   HTN (hypertension)   Acute encephalopathy   Catatonia    Plan   # Altered mental status (unknown etiology) Vs. Catatonia The differential diagnosis for a nonresponsive patient with catatonic features needs to be considered.  These include encephalopathy, focal cerebral lesions, delirium, epilepsy, autoantibody encephalitis, serotonin syndrome, neuroleptic malignant syndrome, acute psychosis and of course catatonia. So far, the workup has been negative although limited due to inability to get all the lab work done.  Recommend continuing follow-up. EEG has been completed and results indicate mild diffuse encephalopathy.  No seizure or epileptiform discharges were seen throughout the recording.  ## Psychiatric Recommendations:   Ativan Challenge completed. Patient received 14 mg  IV of ativan with a partial response. Consider using Ativan PRN only.  Consider an antipsychotic challenge given the premorbid presentation of thought blocking and confusion. Need Creatinine Kinane to rule out NMS( Unlikely but in differential)  Empirical trial of Zyprexa 5 mg BID ( QtcB  471) monitor for EPS. If indicated tomorrow after all labs are back.  ## Medical Decision Making Capacity: Does not appear to have capacity  ## Further Work-up:   Could consider the following studies --> Ammonia, UA, UDS, EEG, MRI, CK  ## Disposition:--TBD  ## Behavioral / Environmental: -Delirium Precautions: Delirium Interventions for Nursing and Staff: - RN to open blinds every AM.  - To Bedside: Glasses, hearing aide, and pt's own shoes. Make available to patients. when possible and encourage use. - Encourage po fluids when appropriate, keep fluids within reach. - OOB to chair with meals. - Passive ROM exercises to all extremities with AM & PM care. - RN to assess orientation to person, time and place QAM and PRN. - Recommend extended visitation hours with familiar family/friends as feasible. - Staff to minimize disturbances at night. Turn off television when pt asleep or when not in use.  ## Safety and Observation Level:  - Based on my clinical evaluation, I estimate the patient to be at moderate risk of self harm in the current setting. - At this time, we recommend  1:1 Observation. This decision is based on my review of the chart including patient's history and current presentation, interview of the patient, mental status examination, and consideration of suicide risk including evaluating suicidal ideation, plan, intent, suicidal or self-harm behaviors, risk factors, and protective factors. This judgment is based on our ability to directly address suicide risk, implement suicide prevention strategies, and develop a safety plan while the patient is in the clinical setting. Please contact our team if there is a concern that risk level has changed.  CSSR Risk Category:C-SSRS RISK CATEGORY: No Risk  Suicide Risk Assessment: Patient has following modifiable risk factors for suicide: None, which we are addressing by evaluation/recommendations. Patient has following non-modifiable or demographic risk factors for suicide: male gender Patient has the following protective factors against suicide: No psychiatric history  Thank you for this consult request. Recommendations have been communicated to the primary team.  We will continue to follow at this time.   Buzz Cass, MD       History of Present Illness   Caleb Leblanc is a 63 y.o. AA male with no known past psychiatric  history, and pertinent medical comorbidities/history that include 2021 hospitalization for unknown etiology altered mental status (I.e., unclear medical etiology metabolic encephalopathy versus secondary gain/factitiousness), who initially presented to the [facility] earlier today, for endorsements of needing his medications, who after being psychiatrically cleared from psychiatric evaluation, was recommended to follow-up with medical services, to rule out medical etiology causing patient's symptoms reported  by individual present with the patient (I.e., recent observation of bizarre behavior), who then arrived to the [facility] a few short hours later for further evaluation and care, that upon EDP evaluation, felt needed further evaluation and workup from psychiatry, thus psychiatry was consulted.  Per EDP team, patient is medically clear at this time, as well as voluntary.  Patient seen today at the [facility] emergency department for face-to-face psychiatric evaluation.  Upon evaluation, patient engagement is largely characterized by oddly related and atypical interpersonal style, with variable to brief eye contact, and oddly constricted affect, and an appreciable variable ability to answer  questions asked (and/or is intentionally selective in answering this provider), despite frequent verbal prompting and reasking and/or restating of questions, but without appearing to be presenting with fluctuations of consciousness.   Patient asked what brought him in this encounter, to which patient states after pausing for a meaningful period of time, while appearing meaningfully oddly related and atypical, "yeah, they keep asking me that."  Patient asked again what brought him in this encounter, but gives no answer.  Patient asked about his evaluation at the [facility] a few short hours ago, and their recommendation to go to the emergency department for further medical workup, to which patient states, "yeah they did."  Patient asked if  he went to the [facility] for any psychiatric complaints that he could articulate to this provider, but gives no answer for a meaningful period of time, then states, "I got stuff going on, so we came here".  Patient appreciably was psychiatrically cleared earlier today by [facility] provider; found no evidence of psychosis, suicidal and or homicidal expressions, depressive and/or anxious symptomology, and/or concerns for safety.   Patient asked to expand on previous statement, then again pauses for a meaningful period time, and appears that he is not going to answer the question asked, then states, "I got pain".  Patient asked where he is hurting and/or experiencing medical complications, but despite numerous prompting, gives no answer.  Per chart review, patient appreciably presented with initial complaints of stomach pains. Patient asked orientation questions repeatedly, but patient unable to provide any answers, and/or will not, this is unclear (nursing reports however that the patient has been oriented upon all of their serial evaluations this encounter).   Patient asked if he is experiencing any depressive and/or anxious symptomology, states, "no, but I just got out of prison" (appear to be suggesting this as a psychosocial stressor, though will not/can't ellaborate). Patient asked about suicidal and or homicidal ideations, denies both of these, but when attempted to be asked about history of suicide attempts and/or self-injurious behavior, gives no answer.  Patient asked about experiencing auditory and or visual hallucinations, denies both of these, and objectively, does not appear to be responding to internal and/or external stimuli.  Patient asked about paranoia, tested for delusional themes, and tested for ideas of reference, but appreciably gives no endorsements of the aforementioned.  Patient objectively shows no signs of fear, appearing distressed, and/or paranoid.  Patient asked about illicit substance use,  tobacco, and/or EtOH, to which at this time, and very oddly, patient responds intently and sharply (curtly if you will), denying any illicit substance use, EtOH use, and/or tobacco.  Chart review reveals a history of smoking tobacco, but no drugs, and/or EtOH use; UDS/UA not obtained at this time, BAL unremarkable.  Patient asked to expand on why he went to prison, to which patient is able to articulate to this writer that he has been in prison since 2021 "I believe", but despite numerous attempts, does not, and/or cannot, articulate to this provider why he was in prison since 2021.  Patient asked if the last time he has utilized hospital services has been since 2021 hospital encounter, to which she states, "yeah... I think it was".  Patient asked if he is on any medications, to which he superficially states that he is.  Patient asked if he has any mental health history, able to answer this provider and denies this.  Patient asked about any inpatient mental health hospitalizations, does not answer this.  Patient asked about any  psychiatric care received in prison, does not answer this (per chart review, has never received mental health services in prison).   Patient attempted to be asked further questions, but begins not answering further questions. Formal [place] performed, and patient is assessed for symptomology consistent with illicit substance use and/or EtOH withdrawal, which the patient test negative for both, then interview was terminated.  Per reports from earlier however, patient was brought in due to abrupt onset of altered mental status.  01/19/2024: The client did not verbal or physically respond to verbal and tactile stimulation.  He is not eating, drinking, or taking medications.  His eyes do not open with a light sternal rub.  No resistance to his arm being picked up but was not able to flex it due to resistance. 01/20/2024: The patient is essentially nonresponsive despite receiving 14  mg of Ativan IV over the last 24 hours.  No response to verbal or tactile stimuli  Review of Systems  Unable to perform ROS: Mental acuity (Limited participation)  Neurological:  Positive for weakness.  Psychiatric/Behavioral:  Negative for depression, hallucinations, substance abuse and suicidal ideas. The patient does not have insomnia.      Psychiatric and Social History  Psychiatric History:  Information collected from chart review  Prev Dx/Sx: None reported Current Psych Provider:  None reported Home Meds (current): None reported Previous Med Trials: None reported Therapy: None reported  Prior Psych Hospitalization: None reported  Prior Self Harm: None reported Prior Violence: None reported  Family Psych History: None reported Family Hx suicide: None reported  Social History:  Developmental Hx: None reported Educational Hx: None reported Occupational Hx: None reported Legal Hx: Just got out of prison about a week ago Living Situation: Lives at [facility], per staff member report Spiritual Hx: None reported  Access to weapons/lethal means: None reported   Substance History Alcohol: : None reported  Type of alcohol : None reported Last Drink : None reported Number of drinks per day : None reported History of alcohol withdrawal seizures : None reported History of DT's : None reported Tobacco: History of smoking, none reported recently Illicit drugs: None reported Prescription drug abuse: None reported Rehab hx: None reported  Exam Findings  Physical Exam: As below Vital Signs:  Temp:  [98.4 F (36.9 C)-99 F (37.2 C)] 99 F (37.2 C) (04/13 0532) Pulse Rate:  [69-78] 69 (04/13 0856) Resp:  [16-18] 18 (04/13 0532) BP: (125-147)/(72-95) 143/76 (04/13 0856) SpO2:  [94 %-99 %] 97 % (04/13 0856) Blood pressure (!) 143/76, pulse 69, temperature 99 F (37.2 C), temperature source Oral, resp. rate 18, SpO2 97%. There is no height or weight on file to calculate  BMI.  Physical Exam Vitals and nursing note reviewed.  Constitutional:      General: He is not in acute distress.    Appearance: He is obese. He is not ill-appearing, toxic-appearing or diaphoretic.     Comments: Oddly related and atypical interpersonal style   Pulmonary:     Effort: Pulmonary effort is normal.  Skin:    General: Skin is warm and dry.  Neurological:     Motor: No tremor or seizure activity.  Psychiatric:        Attention and Perception: Perception normal. He is inattentive. He does not perceive auditory or visual hallucinations.        Mood and Affect: Affect is inappropriate.        Speech: He is noncommunicative. Speech is delayed.  Behavior: Behavior is withdrawn.        Thought Content: Thought content is not paranoid or delusional. Thought content does not include homicidal or suicidal ideation.        Cognition and Memory: Cognition is impaired. Memory is impaired.    Mental Status Exam: General Appearance: Disheveled  Orientation:  Other:  Unable to assess  Memory: Unable to assess  Concentration:  Concentration: Fair and Attention Span: Poor  Recall:  Poor  Attention  Poor  Eye Contact:  Minimal  Speech: Abrupt, abnormal pattern  Language:  Fair  Volume:  Decreased  Mood: "it's getting there"  Affect: Oddly constricted  Thought Process: Short, reserved, vague, odd  Thought Content: Largely logical but atypical  Suicidal Thoughts:  No  Homicidal Thoughts:  No  Judgement:  Impaired  Insight:  Lacking  Psychomotor Activity:  Decreased  Akathisia:  No  Fund of Knowledge:   Unable to assess      Assets:  Desire for Improvement Housing Resilience Social Support Transportation  Cognition:  Impaired,  Mild  ADL's:  Impaired; reported to not be able to attend to  AIMS (if indicated):        Other History   These have been pulled in through the EMR, reviewed, and updated if appropriate.  Family History:  The patient's family history is  not on file.  Medical History: Past Medical History:  Diagnosis Date   Hypertension     Surgical History: History reviewed. No pertinent surgical history.   Medications:   Current Facility-Administered Medications:    acetaminophen (TYLENOL) tablet 650 mg, 650 mg, Oral, Q6H PRN **OR** [DISCONTINUED] acetaminophen (TYLENOL) suppository 650 mg, 650 mg, Rectal, Q6H PRN, Melvin, Alexander B, MD   amLODipine (NORVASC) tablet 10 mg, 10 mg, Oral, Daily, Melvin, Alexander B, MD, 10 mg at 01/21/24 0907   cyanocobalamin (VITAMIN B12) injection 1,000 mcg, 1,000 mcg, Subcutaneous, Daily, Elvera Hamilton T, MD, 1,000 mcg at 01/21/24 0903   enoxaparin (LOVENOX) injection 40 mg, 40 mg, Subcutaneous, Q24H, Melvin, Alexander B, MD, 40 mg at 01/20/24 2254   LORazepam (ATIVAN) injection 1 mg, 1 mg, Intravenous, Q6H, Rolando Whitby, MD   Oral care mouth rinse, 15 mL, Mouth Rinse, PRN, Abbe Abate, MD   polyethylene glycol (MIRALAX / GLYCOLAX) packet 17 g, 17 g, Oral, Daily PRN, Melvin, Alexander B, MD   polyvinyl alcohol (LIQUIFILM TEARS) 1.4 % ophthalmic solution 1 drop, 1 drop, Both Eyes, PRN, Melvin, Alexander B, MD   sodium chloride flush (NS) 0.9 % injection 3 mL, 3 mL, Intravenous, Q12H, Melvin, Alexander B, MD, 3 mL at 01/21/24 8295   thiamine (VITAMIN B1) injection 100 mg, 100 mg, Intravenous, Daily, Abbe Abate, MD, 100 mg at 01/21/24 6213  Allergies: Allergies  Allergen Reactions   Amoxicillin Anaphylaxis   Augmentin [Amoxicillin-Pot Clavulanate] Anaphylaxis    Buzz Cass, MD

## 2024-01-21 NOTE — Plan of Care (Signed)
   Problem: Education: Goal: Knowledge of General Education information will improve Description Including pain rating scale, medication(s)/side effects and non-pharmacologic comfort measures Outcome: Progressing

## 2024-01-22 MED ORDER — OLANZAPINE 5 MG PO TABS
2.5000 mg | ORAL_TABLET | Freq: Every day | ORAL | Status: DC
  Administered 2024-01-22: 2.5 mg via ORAL
  Filled 2024-01-22: qty 1

## 2024-01-22 MED ORDER — LORAZEPAM 1 MG PO TABS
1.0000 mg | ORAL_TABLET | Freq: Two times a day (BID) | ORAL | Status: DC
Start: 2024-01-22 — End: 2024-01-23
  Administered 2024-01-22 – 2024-01-23 (×3): 1 mg via ORAL
  Filled 2024-01-22 (×3): qty 1

## 2024-01-22 NOTE — Plan of Care (Signed)

## 2024-01-22 NOTE — Progress Notes (Signed)
 ADIT RIDDLES  JYN:[id number] DOB: 04/14/1961 DOA: 01/18/2024 PCP: Patient, No Pcp Per    Brief Narrative:  63 year old with a history of obesity, OSA, HTN, and recent incarceration who was transported to the ER from his halfway house after he was found by his roommate standing in one spot in his room not speaking or interacting.  On arrival in the ER the patient was not speaking nor would he move his arms or legs.  He had a history of a similar episode in 2021 at which time a full workup was unremarkable and ultimately malingering was felt to be the etiology.  At presentation vital signs were stable.  Labs were without severe derangement.  Ethanol level was negative.  CT head was without acute findings.  Psychiatry was consulted.  Goals of Care:   Code Status: Full Code   DVT prophylaxis: enoxaparin (LOVENOX) injection 40 mg Start: 01/18/24 2000   Interim Hx: No acute events recorded overnight.  Afebrile.  Vital signs stable.  Continues to awaken more with each visit.  Speech is coming more quickly and fluently today.  Tells me he had trouble not being able to sleep and was "awake for days" before his admission.  Reports some loss of bowel and bladder control since waking up, but is not clear to me this is accurate.  Assessment & Plan:  Acute encephalopathy of unclear etiology CT head unrevealing -ethanol level negative -UDS negative -EEG unrevealing - no clinical evidence of acute infection - Ativan challenge given for possible catatonia with patient receiving a total of 14 mg of IV Ativan - TSH normal -CBGs normal - ammonia normal -cortisol normal -folic acid not low -B12 somewhat low at 268 given this clinical scenario -continue to monitor with slow clinical improvement -psychiatry initiating a trial of antipsychotic  Modest B12 deficiency In this clinical setting I would prefer his B12 level be 400 or > -continue subcutaneous supplementation - will need follow-up in outpatient setting in  6-8 weeks  Mild hypokalemia Corrected with supplementation  HTN No indication for further adjustment in medical therapy today  Obesity   OSA Continue usual CPAP regimen nightly  Family Communication: No family present at time of exam Disposition: Unclear at present - will depend upon medical progress -begin PT/OT   Objective: Blood pressure 132/66, pulse 92, temperature 98.7 F (37.1 C), temperature source Oral, resp. rate 18, SpO2 95%.  Intake/Output Summary (Last 24 hours) at 01/22/2024 1052 Last data filed at 01/22/2024 0600 Gross per 24 hour  Intake 240 ml  Output 800 ml  Net -560 ml   There were no vitals filed for this visit.  Examination: General: No acute respiratory distress -alert and conversant Lungs: Clear to auscultation bilaterally  Cardiovascular: RRR without murmur Abdomen: Soft, bowel sounds positive, no rebound Extremities: Trace bilateral lower extremity edema  CBC: Recent Labs  Lab 01/18/24 0956 01/18/24 1002 01/18/24 2341 01/20/24 1334  WBC 7.5  --  7.7 6.8  NEUTROABS 4.8  --   --   --   HGB 14.4 15.3 12.7* 13.5  HCT 44.7 45.0 38.4* 42.4  MCV 85.3  --  83.8 85.7  PLT 267  --  265 244   Basic Metabolic Panel: Recent Labs  Lab 01/18/24 2341 01/20/24 1334 01/21/24 0835  NA 141 140 144  K 3.4* 3.6 3.6  CL 108 106 110  CO2 25 26 25   GLUCOSE 94 83 79  BUN 15 12 11   CREATININE 1.01 0.93 1.01  CALCIUM 8.6* 8.3* 8.3*  MG 2.4  --  2.4   GFR: CrCl cannot be calculated (Unknown ideal weight.).   Scheduled Meds:  amLODipine  10 mg Oral Daily   cyanocobalamin  1,000 mcg Subcutaneous Daily   enoxaparin (LOVENOX) injection  40 mg Subcutaneous Q24H   LORazepam  1 mg Oral BID   sodium chloride flush  3 mL Intravenous Q12H     LOS: 2 days   Abbe Abate, MD [facility] Office  [phone] Pager - Text Page per Tilford Foley  If 7PM-7AM, please contact night-coverage per Amion 01/22/2024, 10:52 AM

## 2024-01-22 NOTE — Consult Note (Addendum)
 [facility] Psychiatric Consult Follow Up  Patient Name: .Caleb Leblanc  MRN: [id number]  DOB: 04/10/1961  Consult Order details:  Orders (From admission, onward)     Start     Ordered   01/18/24 1426  CONSULT TO CALL ACT TEAM       Ordering Provider: Debbra Fairy, PA-C  Provider:  (Not yet assigned)  Question:  Reason for Consult?  Answer:  altered mental status   01/18/24 1425             Mode of Visit: In person    Psychiatry Consult Evaluation  Service Date: January 22, 2024 LOS:  LOS: 2 days  Chief Complaint: "denied any today"  Primary Psychiatric Diagnoses  AMS (altered mental status)  Assessment   Caleb Leblanc is a 63 y.o. AA male with no known past psychiatric history, and pertinent medical comorbidities/history that include 2021 hospitalization for unknown etiology altered mental status (I.e., unclear medical etiology metabolic encephalopathy versus secondary gain/factitiousness), who initially presented to the [facility] earlier today, for endorsements of needing his medications, who after being psychiatrically cleared from psychiatric evaluation, was recommended to follow-up with medical services, to rule out medical etiology causing patient's symptoms reported  by individual present with the patient (I.e., recent observation of bizarre behavior), who then arrived to the [facility] a few short hours later for further evaluation and care, that upon EDP evaluation, felt needed further evaluation and workup from psychiatry, thus psychiatry was consulted.  Per EDP team, patient is medically clear at this time, as well as voluntary.  Upon evaluation, patient presents with symptomology that is most consistent with altered mental status of unknown etiology at this time.  From evaluation conducted, there is no evidence currently of the patient presenting with endorsements of suicidal and or homicidal ideations, presenting with endorsements of instability in his mental health in the form  of depression and/or anxiety (I.e., psychomotor retardation), presenting with evidence of psychosis (I.e., appearing to be responding to external and/or internal stimuli; making endorsements of hearing or seeing things, expressing delusional themes, expressing paranoia, or expressing ideas of reference), appearing to be intoxicated on illicit substances and/or EtOH, and/or presenting with symptomology consistent with catatonia, upon formal [place]. During time of investigation however, UDS and UA repeatedly unable to be obtained by staff attempts, thus this still at this time has to be considered in the differential, though there is low clinical suspicion, given evaluation performed, as well as the patient's history and previous evaluations and care provided that is available upon chart review (See 2021 hospitalization).  Given the aforementioned, in addition to the patient's history upon chart review, do recommend at this time consider hospitalist and neurology consultation, as well as possibly obtaining additional studies, to rule out further medical etiologies.  Nonetheless however, please utilize low threshold for reconsultation to psychiatry, if there is clinical suspicion going forward for primary underlying psychiatric etiology.   01/21/2024: The patient had received a total of 14 mg of Ativan IV which was discontinued yesterday.  Some of the labs are still pending and his CK has not yet been released.  The patient was seen today he was lying in bed and did respond to verbal stimuli.  He was able to open his eyes and communicate.  His speech was of low volume with some latency but he was able to report that he slept okay but is now tired.  He denies any auditory or visual hallucinations and denies any active suicidal  ideations.  He denies depression today and he is oriented to month here but not to place.  He denies any acute problems at the present time.  It is likely that once Ativan wore off he  did respond to the Ativan challenge.  We will continue to monitor and once the labs are back may consider a low-dose antipsychotic.  His QTc be he is 471 on  01/18/2024.  01/22/2024: Patient seen today and assessed. He was observed to be sleeping initially and awoke easily with verbal stimuli. He was quick and responsive, although he spoke in a decreased tone. He was alert and oriented x 4. He is also aware of the upcoming holiday and what brought him into the ED. He continues to have some mild cognitive impairment although his thought processes are linear and organized. He is able to have a normal conversation and answers questions slowly yet appropriate. He denies any acute psychosis to include hallucinations, delusions, paranoia, and ideas of reference. Furthermore he denies any symptoms of mania  at this time, and reports sleeping well. He denies any si.hi/avh.   Diagnoses:  Active Hospital problems: Principal Problem:   AMS (altered mental status) Active Problems:   OSA (obstructive sleep apnea)   Morbid obesity due to excess calories (HCC)   HTN (hypertension)   Acute encephalopathy   Catatonia    Plan   # Altered mental status (unknown etiology) Vs. Catatonia The differential diagnosis for a nonresponsive patient with catatonic features needs to be considered.  These include encephalopathy, focal cerebral lesions, delirium, epilepsy, autoantibody encephalitis, serotonin syndrome, neuroleptic malignant syndrome, acute psychosis and of course catatonia. So far, the workup has been negative although limited due to inability to get all the lab work done.  Recommend continuing follow-up. EEG has been completed and results indicate mild diffuse encephalopathy.  No seizure or epileptiform discharges were seen throughout the recording.  ## Psychiatric Recommendations:  Consider using Ativan PRN only. Will start 1mg  ativan q6hr prn.   Consider an antipsychotic challenge given the premorbid  presentation of thought blocking and confusion. Will start trial of Zyprexa 2.5 mg qhs ( QtcB  471) monitor for EPS. Initial not dosing not indicated as patient has some improvement of symptoms and no acute psychosis presented today. .  ## Medical Decision Making Capacity: Did not formally assessed.   ## Further Work-up:  Will obtain EKG repeat to ensure QTc is correcting.   Could consider the following studies --> Ammonia, UA, UDS, EEG, MRI, CK  ## Disposition:--TBD  ## Behavioral / Environmental: -Delirium Precautions: Delirium Interventions for Nursing and Staff: - RN to open blinds every AM. - To Bedside: Glasses, hearing aide, and pt's own shoes. Make available to patients. when possible and encourage use. - Encourage po fluids when appropriate, keep fluids within reach. - OOB to chair with meals. - Passive ROM exercises to all extremities with AM & PM care. - RN to assess orientation to person, time and place QAM and PRN. - Recommend extended visitation hours with familiar family/friends as feasible. - Staff to minimize disturbances at night. Turn off television when pt asleep or when not in use.  ## Safety and Observation Level:  - Based on my clinical evaluation, I estimate the patient to be at low risk of self harm in the current setting. - At this time, we recommend  routine. This decision is based on my review of the chart including patient's history and current presentation, interview of the patient, mental status  examination, and consideration of suicide risk including evaluating suicidal ideation, plan, intent, suicidal or self-harm behaviors, risk factors, and protective factors. This judgment is based on our ability to directly address suicide risk, implement suicide prevention strategies, and develop a safety plan while the patient is in the clinical setting. Please contact our team if there is a concern that risk level has changed.  CSSR Risk Category:C-SSRS RISK CATEGORY: No  Risk  Suicide Risk Assessment: Patient has following modifiable risk factors for suicide: None, which we are addressing by evaluation/recommendations. Patient has following non-modifiable or demographic risk factors for suicide: male gender Patient has the following protective factors against suicide: No psychiatric history  Thank you for this consult request. Recommendations have been communicated to the primary team.  We will continue to follow at this time.   Rella Cardinal, FNP       History of Present Illness   Caleb Leblanc is a 63 y.o. AA male with no known past psychiatric history, and pertinent medical comorbidities/history that include 2021 hospitalization for unknown etiology altered mental status (I.e., unclear medical etiology metabolic encephalopathy versus secondary gain/factitiousness), who initially presented to the [facility] earlier today, for endorsements of needing his medications, who after being psychiatrically cleared from psychiatric evaluation, was recommended to follow-up with medical services, to rule out medical etiology causing patient's symptoms reported  by individual present with the patient (I.e., recent observation of bizarre behavior), who then arrived to the [facility] a few short hours later for further evaluation and care, that upon EDP evaluation, felt needed further evaluation and workup from psychiatry, thus psychiatry was consulted.  Per EDP team, patient is medically clear at this time, as well as voluntary.  Patient seen today at the [facility] emergency department for face-to-face psychiatric evaluation.  Upon evaluation, patient engagement is largely characterized by oddly related and atypical interpersonal style, with variable to brief eye contact, and oddly constricted affect, and an appreciable variable ability to answer questions asked (and/or is intentionally selective in answering this provider), despite frequent verbal prompting and reasking and/or  restating of questions, but without appearing to be presenting with fluctuations of consciousness.   Patient asked what brought him in this encounter, to which patient states after pausing for a meaningful period of time, while appearing meaningfully oddly related and atypical, "yeah, they keep asking me that."  Patient asked again what brought him in this encounter, but gives no answer.  Patient asked about his evaluation at the [facility] a few short hours ago, and their recommendation to go to the emergency department for further medical workup, to which patient states, "yeah they did."  Patient asked if he went to the [facility] for any psychiatric complaints that he could articulate to this provider, but gives no answer for a meaningful period of time, then states, "I got stuff going on, so we came here".  Patient appreciably was psychiatrically cleared earlier today by [facility] provider; found no evidence of psychosis, suicidal and or homicidal expressions, depressive and/or anxious symptomology, and/or concerns for safety.   Patient asked to expand on previous statement, then again pauses for a meaningful period time, and appears that he is not going to answer the question asked, then states, "I got pain".  Patient asked where he is hurting and/or experiencing medical complications, but despite numerous prompting, gives no answer.  Per chart review, patient appreciably presented with initial complaints of stomach pains. Patient asked orientation questions repeatedly, but patient unable to provide any answers,  and/or will not, this is unclear (nursing reports however that the patient has been oriented upon all of their serial evaluations this encounter).   Patient asked if he is experiencing any depressive and/or anxious symptomology, states, "no, but I just got out of prison" (appear to be suggesting this as a psychosocial stressor, though will not/can't ellaborate). Patient asked about suicidal and or homicidal  ideations, denies both of these, but when attempted to be asked about history of suicide attempts and/or self-injurious behavior, gives no answer.  Patient asked about experiencing auditory and or visual hallucinations, denies both of these, and objectively, does not appear to be responding to internal and/or external stimuli.  Patient asked about paranoia, tested for delusional themes, and tested for ideas of reference, but appreciably gives no endorsements of the aforementioned.  Patient objectively shows no signs of fear, appearing distressed, and/or paranoid.  Patient asked about illicit substance use, tobacco, and/or EtOH, to which at this time, and very oddly, patient responds intently and sharply (curtly if you will), denying any illicit substance use, EtOH use, and/or tobacco.  Chart review reveals a history of smoking tobacco, but no drugs, and/or EtOH use; UDS/UA not obtained at this time, BAL unremarkable.  Patient asked to expand on why he went to prison, to which patient is able to articulate to this writer that he has been in prison since 2021 "I believe", but despite numerous attempts, does not, and/or cannot, articulate to this provider why he was in prison since 2021.  Patient asked if the last time he has utilized hospital services has been since 2021 hospital encounter, to which she states, "yeah... I think it was".  Patient asked if he is on any medications, to which he superficially states that he is.  Patient asked if he has any mental health history, able to answer this provider and denies this.  Patient asked about any inpatient mental health hospitalizations, does not answer this.  Patient asked about any psychiatric care received in prison, does not answer this (per chart review, has never received mental health services in prison).   Patient attempted to be asked further questions, but begins not answering further questions. Formal [place] performed, and patient is assessed for  symptomology consistent with illicit substance use and/or EtOH withdrawal, which the patient test negative for both, then interview was terminated.  Per reports from earlier however, patient was brought in due to abrupt onset of altered mental status.  Patient report today:  01/22/2024: First interview with patient however he does seem to be improving without the use of antipsychotics. While he does not feel like he is at his baseline or "normal self", he does report feeling better just " tired". He states he was able to rest well last night. He denies any acute psychiatric conditions such as hallucinations, paranoia, delusions, or ideas of reference. He denies any use of substances that could potentially contribute to his acute presentation. He denies any si/hi/avh.   Review of Systems  Reason unable to perform ROS: Limited participation.  Constitutional:  Positive for malaise/fatigue.  Neurological:  Negative for weakness.  Psychiatric/Behavioral:  Negative for depression, hallucinations, substance abuse and suicidal ideas. The patient does not have insomnia.   All other systems reviewed and are negative.    Psychiatric and Social History  Psychiatric History:  Information collected from chart review  Prev Dx/Sx: None reported Current Psych Provider:  None reported Home Meds (current): None reported Previous Med Trials: None reported Therapy: None  reported  Prior Psych Hospitalization: None reported  Prior Self Harm: None reported Prior Violence: None reported  Family Psych History: None reported Family Hx suicide: None reported  Social History:  Developmental Hx: None reported Educational Hx: None reported Occupational Hx: None reported Legal Hx: Just got out of prison about a week ago Living Situation: Lives at [facility], per staff member report Spiritual Hx: None reported  Access to weapons/lethal means: None reported   Substance History Alcohol: : None reported   Type of alcohol : None reported Last Drink : None reported Number of drinks per day : None reported History of alcohol withdrawal seizures : None reported History of DT's : None reported Tobacco: History of smoking, none reported recently Illicit drugs: None reported Prescription drug abuse: None reported Rehab hx: None reported  Exam Findings  Physical Exam: Asleep in bed, with cpap at bedside.  Vital Signs:  Temp:  [98.2 F (36.8 C)-98.7 F (37.1 C)] 98.7 F (37.1 C) (04/14 2000) Pulse Rate:  [73-92] 86 (04/14 2000) Resp:  [18-20] 20 (04/14 2000) BP: (120-140)/(66-89) 128/80 (04/14 2000) SpO2:  [93 %-99 %] 94 % (04/14 2000) FiO2 (%):  [21 %] 21 % (04/13 2100) Blood pressure 128/80, pulse 86, temperature 98.7 F (37.1 C), temperature source Oral, resp. rate 20, SpO2 94%. There is no height or weight on file to calculate BMI.  Physical Exam Vitals and nursing note reviewed.  Constitutional:      General: He is not in acute distress.    Appearance: He is obese. He is not ill-appearing, toxic-appearing or diaphoretic.     Comments: Oddly related and atypical interpersonal style   Pulmonary:     Effort: Pulmonary effort is normal.  Skin:    General: Skin is warm and dry.  Neurological:     General: No focal deficit present.     Mental Status: He is oriented to person, place, and time.     Motor: No tremor or seizure activity.  Psychiatric:        Attention and Perception: Attention and perception normal. He is attentive. He does not perceive auditory or visual hallucinations.        Mood and Affect: Mood normal. Affect is flat.        Speech: He is communicative. Speech is delayed.        Behavior: Behavior normal. Behavior is cooperative.        Thought Content: Thought content is not paranoid or delusional. Thought content does not include homicidal or suicidal ideation.        Cognition and Memory: Cognition is not impaired. Memory is not impaired.        Judgment:  Judgment normal.    Mental Status Exam: General Appearance: Fairly Groomed  Orientation:  Full (Time, Place, and Person)  Memory: Unable to assess  Concentration:  Concentration: Fair and Attention Span: Poor  Recall:  Fair  Attention  Poor  Eye Contact:  Fair  Speech: slow  Language:  Fair  Volume:  Normal  Mood: Better  Affect: Appropriate  Thought Process: more linear  Thought Content: Largely logical but atypica  Suicidal Thoughts:  No  Homicidal Thoughts:  No  Judgement:  Intact  Insight:  Present  Psychomotor Activity:  Decreased  Akathisia:  No  Fund of Knowledge:   Improving      Assets:  Desire for Improvement Housing Resilience Social Support Transportation  Cognition:  Impaired,  Mild  ADL's:  Impaired; reported to not  be able to attend to  AIMS (if indicated):        Other History   These have been pulled in through the EMR, reviewed, and updated if appropriate.  Family History:  The patient's family history is not on file.  Medical History: Past Medical History:  Diagnosis Date   Hypertension     Surgical History: History reviewed. No pertinent surgical history.   Medications:   Current Facility-Administered Medications:    acetaminophen (TYLENOL) tablet 650 mg, 650 mg, Oral, Q6H PRN **OR** [DISCONTINUED] acetaminophen (TYLENOL) suppository 650 mg, 650 mg, Rectal, Q6H PRN, Melvin, Alexander B, MD   amLODipine (NORVASC) tablet 10 mg, 10 mg, Oral, Daily, Melvin, Alexander B, MD, 10 mg at 01/22/24 0816   cyanocobalamin (VITAMIN B12) injection 1,000 mcg, 1,000 mcg, Subcutaneous, Daily, Elvera Hamilton T, MD, 1,000 mcg at 01/22/24 0816   enoxaparin (LOVENOX) injection 40 mg, 40 mg, Subcutaneous, Q24H, Melvin, Alexander B, MD, 40 mg at 01/21/24 2132   LORazepam (ATIVAN) injection 1 mg, 1 mg, Intravenous, Q6H PRN, Abbe Abate, MD   LORazepam (ATIVAN) tablet 1 mg, 1 mg, Oral, BID, Starkes-Perry, Evany Schecter S, FNP, 1 mg at 01/22/24 1113   OLANZapine  (ZYPREXA) tablet 2.5 mg, 2.5 mg, Oral, QHS, Starkes-Perry, Ed Rayson S, FNP   Oral care mouth rinse, 15 mL, Mouth Rinse, PRN, Abbe Abate, MD   polyethylene glycol (MIRALAX / GLYCOLAX) packet 17 g, 17 g, Oral, Daily PRN, Melvin, Alexander B, MD   polyvinyl alcohol (LIQUIFILM TEARS) 1.4 % ophthalmic solution 1 drop, 1 drop, Both Eyes, PRN, Melvin, Alexander B, MD   sodium chloride flush (NS) 0.9 % injection 3 mL, 3 mL, Intravenous, Q12H, Melvin, Alexander B, MD, 3 mL at 01/22/24 0816  Allergies: Allergies  Allergen Reactions   Amoxicillin Anaphylaxis   Augmentin [Amoxicillin-Pot Clavulanate] Anaphylaxis    Shamar Engelmann S Starkes-Perry, FNP

## 2024-01-22 NOTE — Plan of Care (Signed)
   Problem: Education: Goal: Knowledge of General Education information will improve Description Including pain rating scale, medication(s)/side effects and non-pharmacologic comfort measures Outcome: Progressing

## 2024-01-22 NOTE — Progress Notes (Signed)
   01/22/24 2321  BiPAP/CPAP/SIPAP  $ Non-Invasive Ventilator  Non-Invasive Vent Subsequent  BiPAP/CPAP/SIPAP Pt Type Adult  BiPAP/CPAP/SIPAP DREAMSTATIOND  Mask Type Full face mask  Mask Size Large  EPAP 10 cmH2O  FiO2 (%) 21 %  Patient Home Machine No  Patient Home Mask No  Patient Home Tubing No  Auto Titrate No  Device Plugged into RED Power Outlet Yes  BiPAP/CPAP /SiPAP Vitals  Pulse Rate 86  Resp 20  SpO2 94 %  Bilateral Breath Sounds Clear;Diminished  MEWS Score/Color  MEWS Score 0  MEWS Score Color Marrie Sizer

## 2024-01-23 DIAGNOSIS — G9340 Encephalopathy, unspecified: Secondary | ICD-10-CM

## 2024-01-23 LAB — GLUCOSE, CAPILLARY: Glucose-Capillary: 123 mg/dL — ABNORMAL HIGH (ref 70–99)

## 2024-01-23 LAB — BASIC METABOLIC PANEL WITH GFR
Anion gap: 8 (ref 5–15)
BUN: 12 mg/dL (ref 8–23)
CO2: 27 mmol/L (ref 22–32)
Calcium: 8.5 mg/dL — ABNORMAL LOW (ref 8.9–10.3)
Chloride: 105 mmol/L (ref 98–111)
Creatinine, Ser: 1.11 mg/dL (ref 0.61–1.24)
GFR, Estimated: >60 mL/min (ref >60)
Glucose, Bld: 105 mg/dL — ABNORMAL HIGH (ref 70–99)
Potassium: 3.5 mmol/L (ref 3.5–5.1)
Sodium: 140 mmol/L (ref 135–145)

## 2024-01-23 LAB — VITAMIN B1: Vitamin B1 (Thiamine): 102.7 nmol/L (ref 66.5–200.0)

## 2024-01-23 LAB — CK ISOENZYMES
CK-BB: 0 %
CK-MB: 0 % (ref 0–3)
CK-MM: 100 % (ref 97–100)
Creatine Kinase-Total: 703 U/L — ABNORMAL HIGH (ref 41–331)
Macro Type 1: 0 %
Macro Type 2: 0 %

## 2024-01-23 MED ORDER — VITAMIN B-12 1000 MCG PO TABS
1000.0000 ug | ORAL_TABLET | Freq: Every day | ORAL | Status: DC
  Administered 2024-01-24 – 2024-01-26 (×3): 1000 ug via ORAL
  Filled 2024-01-23 (×3): qty 1

## 2024-01-23 MED ORDER — ENOXAPARIN SODIUM 60 MG/0.6ML IJ SOSY
60.0000 mg | PREFILLED_SYRINGE | INTRAMUSCULAR | Status: DC
  Administered 2024-01-23 – 2024-01-25 (×3): 60 mg via SUBCUTANEOUS
  Filled 2024-01-23 (×3): qty 0.6

## 2024-01-23 MED ORDER — OLANZAPINE 5 MG PO TABS
5.0000 mg | ORAL_TABLET | Freq: Every day | ORAL | Status: DC
  Administered 2024-01-23 – 2024-01-25 (×3): 5 mg via ORAL
  Filled 2024-01-23 (×3): qty 1

## 2024-01-23 MED ORDER — LOPERAMIDE HCL 2 MG PO CAPS
2.0000 mg | ORAL_CAPSULE | Freq: Three times a day (TID) | ORAL | Status: AC
  Administered 2024-01-23 – 2024-01-24 (×2): 2 mg via ORAL
  Filled 2024-01-23 (×2): qty 1
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
## 2024-01-23 NOTE — Progress Notes (Signed)
   01/23/24 2317  BiPAP/CPAP/SIPAP  $ Non-Invasive Ventilator  Non-Invasive Vent Subsequent  BiPAP/CPAP/SIPAP Pt Type Adult  BiPAP/CPAP/SIPAP DREAMSTATIOND  Mask Type Full face mask  Mask Size Large  EPAP 10 cmH2O  FiO2 (%) 21 %  Patient Home Machine No  Patient Home Mask No  Patient Home Tubing No  Auto Titrate No  Device Plugged into RED Power Outlet Yes  BiPAP/CPAP /SiPAP Vitals  Pulse Rate 87  Resp 20  SpO2 98 %  Bilateral Breath Sounds Clear;Diminished  MEWS Score/Color  MEWS Score 0  MEWS Score Color Marrie Sizer

## 2024-01-23 NOTE — Evaluation (Signed)
 Physical Therapy Evaluation Patient Details Name: Caleb Leblanc MRN: [id number] DOB: 1961-09-02 Today's Date: 01/23/2024  History of Present Illness  63 year old with a history of obesity, OSA, HTN, and recent incarceration who was transported to the ER 01/18/24  from his halfway house after he was found by his roommate standing in one spot in his room not speaking or interacting.  On arrival in the ER the patient was not speaking nor would he move his arms or legs.  Clinical Impression  Patient alert, agrees to PT assessment. Upon exiting bed, patient found to have had BM in bed. Reports he does not know when he has to go. Patient required min A for bed mobility and sit to stand from elevated bed. Assisted patient with cleaning up and patient then ambulated to bathroom and back to bed with RW and cga. He will continue to benefit from skilled PT to improve strength and independence.          If plan is discharge home, recommend the following: A little help with walking and/or transfers;A little help with bathing/dressing/bathroom;Assist for transportation;Help with stairs or ramp for entrance   Can travel by private vehicle    Yes?    Equipment Recommendations Rolling walker (2 wheels)  Recommendations for Other Services       Functional Status Assessment Patient has had a recent decline in their functional status and demonstrates the ability to make significant improvements in function in a reasonable and predictable amount of time.     Precautions / Restrictions Precautions Precautions: None Restrictions Weight Bearing Restrictions Per Provider Order: No      Mobility  Bed Mobility Overal bed mobility: Needs Assistance Bed Mobility: Supine to Sit, Sit to Supine     Supine to sit: Min assist, HOB elevated Sit to supine: Min assist   General bed mobility comments: Min assist with LEs    Transfers Overall transfer level: Needs assistance Equipment used: Rolling walker (2  wheels) Transfers: Sit to/from Stand Sit to Stand: Min assist, From elevated surface                Ambulation/Gait Ambulation/Gait assistance: Contact guard assist Gait Distance (Feet): 15 Feet Assistive device: Rolling walker (2 wheels) Gait Pattern/deviations: Step-to pattern, Decreased step length - right, Decreased step length - left, Decreased stride length Gait velocity: decr     General Gait Details: patient ambulated to bathroom and back to bed with RW, cga  Stairs            Wheelchair Mobility     Tilt Bed    Modified Rankin (Stroke Patients Only)       Balance Overall balance assessment: Needs assistance Sitting-balance support: Feet supported Sitting balance-Leahy Scale: Good     Standing balance support: Bilateral upper extremity supported, During functional activity, Reliant on assistive device for balance Standing balance-Leahy Scale: Good Standing balance comment: able to stand on scale without UE support                             Pertinent Vitals/Pain Pain Assessment Pain Assessment: Faces Faces Pain Scale: Hurts a little bit Pain Location: feet Pain Descriptors / Indicators: Discomfort, Sore Pain Intervention(s): Monitored during session, Repositioned    Home Living Family/patient expects to be discharged to:: Group home Living Arrangements: Group Home  Prior Function Prior Level of Function : Independent/Modified Independent                     Extremity/Trunk Assessment   Upper Extremity Assessment Upper Extremity Assessment: Generalized weakness    Lower Extremity Assessment Lower Extremity Assessment: Generalized weakness    Cervical / Trunk Assessment Cervical / Trunk Assessment: Normal  Communication   Communication Communication: No apparent difficulties    Cognition Arousal: Alert Behavior During Therapy: WFL for tasks assessed/performed   PT - Cognitive  impairments: No apparent impairments                         Following commands: Impaired Following commands impaired: Follows one step commands with increased time     Cueing Cueing Techniques: Verbal cues, Gestural cues     General Comments      Exercises     Assessment/Plan    PT Assessment Patient needs continued PT services  PT Problem List Decreased strength;Decreased activity tolerance;Decreased mobility;Pain       PT Treatment Interventions Gait training;Functional mobility training;Therapeutic activities;Therapeutic exercise;Patient/family education    PT Goals (Current goals can be found in the Care Plan section)  Acute Rehab PT Goals Patient Stated Goal: return to group home PT Goal Formulation: With patient Time For Goal Achievement: 01/30/24 Potential to Achieve Goals: Good    Frequency Min 2X/week     Co-evaluation               AM-PAC PT "6 Clicks" Mobility  Outcome Measure Help needed turning from your back to your side while in a flat bed without using bedrails?: A Little Help needed moving from lying on your back to sitting on the side of a flat bed without using bedrails?: A Little Help needed moving to and from a bed to a chair (including a wheelchair)?: A Little Help needed standing up from a chair using your arms (e.g., wheelchair or bedside chair)?: A Little Help needed to walk in hospital room?: A Little Help needed climbing 3-5 steps with a railing? : A Lot 6 Click Score: 17    End of Session   Activity Tolerance: Patient tolerated treatment well;Patient limited by fatigue Patient left: in bed;with call bell/phone within reach Nurse Communication: Mobility status PT Visit Diagnosis: Other abnormalities of gait and mobility (R26.89);Muscle weakness (generalized) (M62.81);Difficulty in walking, not elsewhere classified (R26.2)    Time: 1330-1406 PT Time Calculation (min) (ACUTE ONLY): 36 min   Charges:   PT  Evaluation $PT Eval Moderate Complexity: 1 Mod PT Treatments $Gait Training: 8-22 mins $Therapeutic Activity: 8-22 mins PT General Charges $$ ACUTE PT VISIT: 1 Visit         Khye Hochstetler, PT, GCS 01/23/24,2:15 PM

## 2024-01-23 NOTE — Progress Notes (Signed)
 FRIEDRICH HARRIOTT  ZOX:[id number] DOB: 11-12-1960 DOA: 01/18/2024 PCP: Patient, No Pcp Per    Brief Narrative:  63 year old with a history of obesity, OSA, HTN, and recent incarceration who was transported to the ER from his halfway house after he was found by his roommate standing in one spot in his room not speaking or interacting.  On arrival in the ER the patient was not speaking nor would he move his arms or legs.  He had a history of a similar episode in 2021 at which time a full workup was unremarkable and ultimately malingering was felt to be the etiology.  At presentation vital signs were stable.  Labs were without severe derangement.  Ethanol level was negative.  CT head was without acute findings.  Psychiatry was consulted.  Goals of Care:   Code Status: Full Code   DVT prophylaxis: enoxaparin (LOVENOX) injection 40 mg Start: 01/18/24 2000   Interim Hx: The patient has exhibited slow but consistent improvement in his mental status over the last few days.  No acute events reported overnight.  Afebrile.  Vital signs stable.  Continues to report some sudden bowel movements which she has difficulty controlling.  Assessment & Plan:  Acute encephalopathy of unclear etiology CT head unrevealing -ethanol level negative -UDS negative -EEG unrevealing - no clinical evidence of acute infection - Ativan challenge given for possible catatonia with patient receiving a total of 14 mg of IV Ativan - TSH normal -CBGs normal - ammonia normal -cortisol normal -folic acid not low -B12 somewhat low at 268 given this clinical scenario -continue to monitor with slow clinical improvement -appears to be approaching his baseline -medical management per Psychiatry  Modest B12 deficiency In this clinical setting I would prefer his B12 level be 400 or > -continue subcutaneous supplementation - will need follow-up in outpatient setting in 6-8 weeks  Mild hypokalemia Corrected with supplementation  Frequent  urgent bowel movements It is not clear to me at present that this is true bowel incontinence -will give trial of Imodium and monitor   HTN No indication for further adjustment in medical therapy  Obesity   OSA Continue usual CPAP regimen nightly  Family Communication: No family present at time of exam Disposition: Unclear at present - will depend upon medical progress and performance with PT/OT   Objective: Blood pressure 123/76, pulse 81, temperature 98.3 F (36.8 C), resp. rate 18, SpO2 96%.  Intake/Output Summary (Last 24 hours) at 01/23/2024 1005 Last data filed at 01/23/2024 0914 Gross per 24 hour  Intake 0 ml  Output 1400 ml  Net -1400 ml   There were no vitals filed for this visit.  Examination: General: No acute respiratory distress -alert and conversant Lungs: Clear to auscultation bilaterally  Cardiovascular: RRR without murmur Abdomen: Soft, bowel sounds positive, no rebound -no mass appreciable Extremities: Trace bilateral lower extremity edema  CBC: Recent Labs  Lab 01/18/24 0956 01/18/24 1002 01/18/24 2341 01/20/24 1334  WBC 7.5  --  7.7 6.8  NEUTROABS 4.8  --   --   --   HGB 14.4 15.3 12.7* 13.5  HCT 44.7 45.0 38.4* 42.4  MCV 85.3  --  83.8 85.7  PLT 267  --  265 244   Basic Metabolic Panel: Recent Labs  Lab 01/18/24 2341 01/20/24 1334 01/21/24 0835 01/23/24 0502  NA 141 140 144 140  K 3.4* 3.6 3.6 3.5  CL 108 106 110 105  CO2 25 26 25 27   GLUCOSE 94 83  79 105*  BUN 15 12 11 12   CREATININE 1.01 0.93 1.01 1.11  CALCIUM 8.6* 8.3* 8.3* 8.5*  MG 2.4  --  2.4  --    GFR: CrCl cannot be calculated (Unknown ideal weight.).   Scheduled Meds:  amLODipine  10 mg Oral Daily   enoxaparin (LOVENOX) injection  40 mg Subcutaneous Q24H   LORazepam  1 mg Oral BID   OLANZapine  2.5 mg Oral QHS   sodium chloride flush  3 mL Intravenous Q12H     LOS: 3 days   Abbe Abate, MD [facility] Office  [phone] Pager - Text Page  per [place] Foley  If 7PM-7AM, please contact night-coverage per Amion 01/23/2024, 10:05 AM

## 2024-01-24 NOTE — Progress Notes (Signed)
 Mobility Specialist Progress Note:    01/24/24 0900  Mobility  Activity Ambulated with assistance to bathroom  Level of Assistance Contact guard assist, steadying assist  Assistive Device Front wheel walker  Distance Ambulated (ft) 30 ft  Activity Response Tolerated well  Mobility Referral Yes  Mobility visit 1 Mobility  Mobility Specialist Start Time (ACUTE ONLY) [id number]  Mobility Specialist Stop Time (ACUTE ONLY) 0909  Mobility Specialist Time Calculation (min) (ACUTE ONLY) 18 min   Pt received in bed and agreeable. Requested assistance to BR. Able to stand from elevated bed w/ minG. BM unsuccessful. Pt left in bed with call bell and all needs met. Bed alarm on.  D'Vante Nolon Baxter Mobility Specialist Please contact via Special educational needs teacher or Rehab office at [phone]

## 2024-01-24 NOTE — Plan of Care (Signed)
   Problem: Education: Goal: Knowledge of General Education information will improve Description: Including pain rating scale, medication(s)/side effects and non-pharmacologic comfort measures Outcome: Completed/Met

## 2024-01-24 NOTE — Progress Notes (Signed)
  Progress Note   Patient: CYPRUS KUANG JYN:[id number] DOB: Apr 28, 1961 DOA: 01/18/2024     4 DOS: the patient was seen and examined on 01/24/2024   Brief hospital course: 63 year old with a history of obesity, OSA, HTN, and recent incarceration who was transported to the ER from his halfway house after he was found by his roommate standing in one spot in his room not speaking or interacting. On arrival in the ER the patient was not speaking nor would he move his arms or legs. He had a history of a similar episode in 2021 at which time a full workup was unremarkable and ultimately malingering was felt to be the etiology. At presentation vital signs were stable. Labs were without severe derangement. Ethanol level was negative. CT head was without acute findings. Psychiatry was consulted   Assessment and Plan: Acute encephalopathy of unclear etiology CT head unrevealing -ethanol level negative -UDS negative -EEG unrevealing - no clinical evidence of acute infection - Ativan challenge given for possible catatonia with patient receiving a total of 14 mg of IV Ativan - TSH normal -CBGs normal - ammonia normal -cortisol normal -folic acid not low -B12 somewhat low at 268 given this clinical scenario -continue to monitor with slow clinical improvement -suspect pt may be approaching his baseline -Psychiatry following, now on zyprexa 5mg  at bedtime. Recs for outpatient appt with [facility] on d/c    Modest B12 deficiency In this clinical setting I would prefer his B12 level be 400 or > -continue subcutaneous supplementation - will need follow-up in outpatient setting in 6-8 weeks   Mild hypokalemia Within norma limits   Frequent urgent bowel movements -on imodium as needed   HTN No indication for further adjustment in medical therapy -BP stable   Obesity    OSA Continue usual CPAP regimen nightly       Subjective: Without complaints  Physical Exam: Vitals:   01/23/24 1956 01/23/24 2317 01/24/24  0524 01/24/24 0741  BP: 110/80  132/79 113/69  Pulse: 88 87 77 76  Resp: 20 20 18 18   Temp: 98.2 F (36.8 C)  98 F (36.7 C) 98.4 F (36.9 C)  TempSrc: Oral  Oral   SpO2: 98% 98% 97% 98%  Weight: 127 kg     Height:       General exam: Awake, laying in bed, in nad Respiratory system: Normal respiratory effort, no wheezing Cardiovascular system: regular rate, s1, s2 Gastrointestinal system: Soft, nondistended, positive BS Central nervous system: CN2-12 grossly intact, strength intact Extremities: Perfused, no clubbing Skin: Normal skin turgor, no notable skin lesions seen Psychiatry: Mood normal // no visual hallucinations   Data Reviewed:  There are no new results to review at this time.  Family Communication: Pt in room, family not at bedside  Disposition: Status is: Inpatient Remains inpatient appropriate because: severity of illness  Planned Discharge Destination: Home    Author: Cherylle Corwin, MD 01/24/2024 5:15 PM  For on call review [email or web address].

## 2024-01-24 NOTE — Evaluation (Signed)
 Occupational Therapy Evaluation Patient Details Name: ICHOLAS IRBY MRN: [id number] DOB: 1961-04-08 Today's Date: 01/24/2024   History of Present Illness   63 year old with a history of obesity, OSA, HTN, and recent incarceration who was transported to the ER 01/18/24  from his halfway house after he was found by his roommate standing in one spot in his room not speaking or interacting.  On arrival in the ER the patient was not speaking nor would he move his arms or legs.     Clinical Impressions Pt c/o general fatigue/weakness, did not remember events that brought him to ER but knows that others have told him he was confused. Pt lives in group home, PLOF independent, Pt currently close to baseline but limited due to generalized weakness. BUE swelling noted, limits hand/finger ROM, poor FM skills but able to complete functional ADLs as needed with set up/CGA. Pt able to stand unsupported but benefit from use of RW. Recommending RW for DC but Pt may not states he may not be able to afford it. Will continue to see Pt acutely and progress as able, no OT follow up recommended.      If plan is discharge home, recommend the following:   A little help with walking and/or transfers;A little help with bathing/dressing/bathroom;Assistance with cooking/housework;Assist for transportation;Help with stairs or ramp for entrance     Functional Status Assessment   Patient has had a recent decline in their functional status and demonstrates the ability to make significant improvements in function in a reasonable and predictable amount of time.     Equipment Recommendations   Other (comment) (RW but Pt may not be able to afford it, no ins on file)     Recommendations for Other Services         Precautions/Restrictions   Precautions Precautions: None Recall of Precautions/Restrictions: Intact Restrictions Weight Bearing Restrictions Per Provider Order: No     Mobility Bed  Mobility Overal bed mobility: Modified Independent Bed Mobility: Supine to Sit, Sit to Supine     Supine to sit: Supervision, HOB elevated, Used rails Sit to supine: Supervision, Used rails   General bed mobility comments: supervision for safety, HOB elevated, used rails, increased time    Transfers Overall transfer level: Needs assistance Equipment used: Rolling walker (2 wheels) Transfers: Sit to/from Stand, Bed to chair/wheelchair/BSC Sit to Stand: Contact guard assist     Step pivot transfers: Contact guard assist     General transfer comment: CGA for safety      Balance Overall balance assessment: Needs assistance Sitting-balance support: No upper extremity supported, Feet supported Sitting balance-Leahy Scale: Good Sitting balance - Comments: EOB ADLs   Standing balance support: No upper extremity supported, During functional activity Standing balance-Leahy Scale: Fair Standing balance comment: able to stand unsupported, stiff BLEs and obesity limit balance recovery, slow/antalgic gait, RW for support.                           ADL either performed or assessed with clinical judgement   ADL Overall ADL's : Needs assistance/impaired                                       General ADL Comments: set up/CGA for safety, Pt requires increased time to complete ADLs, stiffness in BLEs and obesity limits LB ADLs and OOB activities, decrased FM skills to  B hands and some swelling noticed limit FM skills     Vision Ability to See in Adequate Light: 0 Adequate Patient Visual Report: Blurring of vision       Perception         Praxis         Pertinent Vitals/Pain Pain Assessment Pain Assessment: No/denies pain     Extremity/Trunk Assessment Upper Extremity Assessment Upper Extremity Assessment: Generalized weakness;RUE deficits/detail;LUE deficits/detail RUE Deficits / Details: notable swelling to B hands, limits gross grip and FM  skills, shoulders/elbows good strength/ROM. Pt states some numbness to fingers RUE Coordination: decreased fine motor LUE Deficits / Details: notable swelling to B hands, limits gross grip and FM skills, shoulders/elbows good strength/ROM. Pt states some numbness to fingers LUE Coordination: decreased fine motor           Communication     Cognition Arousal: Alert Behavior During Therapy: WFL for tasks assessed/performed Cognition: No apparent impairments                               Following commands: Intact       Cueing  General Comments          Exercises     Shoulder Instructions      Home Living Family/patient expects to be discharged to:: Group home Living Arrangements: Group Home   Type of Home: Group Home Home Access: Stairs to enter Entrance Stairs-Number of Steps: 3 Entrance Stairs-Rails: None Home Layout: One level     Bathroom Shower/Tub: Chief Strategy Officer: Standard     Home Equipment: None          Prior Functioning/Environment Prior Level of Function : Independent/Modified Independent                    OT Problem List: Decreased strength;Decreased range of motion;Decreased activity tolerance;Impaired balance (sitting and/or standing);Obesity;Increased edema;Impaired UE functional use   OT Treatment/Interventions: Self-care/ADL training;Therapeutic exercise;Energy conservation;DME and/or AE instruction;Therapeutic activities;Balance training;Patient/family education      OT Goals(Current goals can be found in the care plan section)   Acute Rehab OT Goals Patient Stated Goal: to improve strength OT Goal Formulation: With patient Time For Goal Achievement: 02/07/24 Potential to Achieve Goals: Good   OT Frequency:  Min 2X/week    Co-evaluation              AM-PAC OT "6 Clicks" Daily Activity     Outcome Measure Help from another person eating meals?: None Help from another person  taking care of personal grooming?: A Little Help from another person toileting, which includes using toliet, bedpan, or urinal?: A Little Help from another person bathing (including washing, rinsing, drying)?: A Little Help from another person to put on and taking off regular upper body clothing?: A Little Help from another person to put on and taking off regular lower body clothing?: A Little 6 Click Score: 19   End of Session Equipment Utilized During Treatment: Gait belt;Rolling walker (2 wheels) Nurse Communication: Mobility status  Activity Tolerance: Patient tolerated treatment well Patient left: in bed;with call bell/phone within reach  OT Visit Diagnosis: Unsteadiness on feet (R26.81);Other abnormalities of gait and mobility (R26.89);Muscle weakness (generalized) (M62.81)                Time: 9147-8295 OT Time Calculation (min): 31 min Charges:  OT General Charges $OT Visit: 1 Visit OT Evaluation $OT Eval  Low Complexity: 1 Low OT Treatments $Self Care/Home Management : 8-22 mins  [place], OTR/L   Scherry Curtis 01/24/2024, 2:32 PM
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
## 2024-01-24 NOTE — TOC Progression Note (Signed)
 Transition of Care [facility]) - Progression Note    Patient Details  Name: Caleb Leblanc MRN: [id number] Date of Birth: 1961-08-21  Transition of Care [facility]) CM/SW Contact  Bronnie Vasseur A [place], LCSW Phone Number: 01/24/2024, 1:03 PM  Clinical Narrative:      CSW set up outpatient psychiatry follow up appointment for pt. Additional outpatient resources and social services resources provided in pt's AVS.   No other needs identified at this time. TOC will sign off, please consult again if TOC needs arise.        Expected Discharge Plan and Services                                               Social Determinants of Health (SDOH) Interventions SDOH Screenings   Food Insecurity: No Food Insecurity (01/18/2024)  Housing: Low Risk  (01/18/2024)  Transportation Needs: No Transportation Needs (01/18/2024)  Utilities: Not At Risk (01/18/2024)  Social Connections: Socially Isolated (01/18/2024)  Tobacco Use: Medium Risk (01/18/2024)    Readmission Risk Interventions     No data to display

## 2024-01-24 NOTE — Hospital Course (Signed)
 63 year old with a history of obesity, OSA, HTN, and recent incarceration who was transported to the ER from his halfway house after he was found by his roommate standing in one spot in his room not speaking or interacting. On arrival in the ER the patient was not speaking nor would he move his arms or legs. He had a history of a similar episode in 2021 at which time a full workup was unremarkable and ultimately malingering was felt to be the etiology. At presentation vital signs were stable. Labs were without severe derangement. Ethanol level was negative. CT head was without acute findings. Psychiatry was consulted

## 2024-01-24 NOTE — Plan of Care (Signed)
   Problem: Health Behavior/Discharge Planning: Goal: Ability to manage health-related needs will improve Outcome: Progressing

## 2024-01-25 ENCOUNTER — Inpatient Hospital Stay (HOSPITAL_COMMUNITY): Payer: Self-pay

## 2024-01-25 DIAGNOSIS — M79662 Pain in left lower leg: Secondary | ICD-10-CM

## 2024-01-25 LAB — COMPREHENSIVE METABOLIC PANEL WITH GFR
ALT: 66 U/L — ABNORMAL HIGH (ref 0–44)
AST: 95 U/L — ABNORMAL HIGH (ref 15–41)
Albumin: 2.9 g/dL — ABNORMAL LOW (ref 3.5–5.0)
Alkaline Phosphatase: 41 U/L (ref 38–126)
Anion gap: 11 (ref 5–15)
BUN: 12 mg/dL (ref 8–23)
CO2: 26 mmol/L (ref 22–32)
Calcium: 8.9 mg/dL (ref 8.9–10.3)
Chloride: 103 mmol/L (ref 98–111)
Creatinine, Ser: 1.02 mg/dL (ref 0.61–1.24)
GFR, Estimated: >60 mL/min (ref >60)
Glucose, Bld: 107 mg/dL — ABNORMAL HIGH (ref 70–99)
Potassium: 4.1 mmol/L (ref 3.5–5.1)
Sodium: 140 mmol/L (ref 135–145)
Total Bilirubin: 0.5 mg/dL (ref 0.0–1.2)
Total Protein: 7.2 g/dL (ref 6.5–8.1)

## 2024-01-25 LAB — CBC
HCT: 42.6 % (ref 39.0–52.0)
Hemoglobin: 13.8 g/dL (ref 13.0–17.0)
MCH: 27.4 pg (ref 26.0–34.0)
MCHC: 32.4 g/dL (ref 30.0–36.0)
MCV: 84.7 fL (ref 80.0–100.0)
Platelets: 258 10*3/uL (ref 150–400)
RBC: 5.03 MIL/uL (ref 4.22–5.81)
RDW: 14.6 % (ref 11.5–15.5)
WBC: 5.8 10*3/uL (ref 4.0–10.5)
nRBC: 0 % (ref 0.0–0.2)

## 2024-01-25 LAB — AMMONIA: Ammonia: 17 umol/L (ref 9–35)

## 2024-01-25 LAB — CK: Total CK: 107 U/L (ref 49–397)
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
## 2024-01-25 NOTE — Progress Notes (Signed)
 Mobility Specialist Progress Note:    01/25/24 0900  Mobility  Activity Ambulated with assistance to bathroom  Level of Assistance Contact guard assist, steadying assist  Assistive Device Front wheel walker  Distance Ambulated (ft) 30 ft  Activity Response Tolerated well  Mobility Referral Yes  Mobility visit 1 Mobility  Mobility Specialist Start Time (ACUTE ONLY) [id number]  Mobility Specialist Stop Time (ACUTE ONLY) [id number]  Mobility Specialist Time Calculation (min) (ACUTE ONLY) 27 min   Pt received in bed and agreeable. Had successful BM in BR. MS assisted w/ peri care. No complaints throughout. Pt left in chair with call bell and all needs met. Chair alarm on.  D'Vante Nolon Baxter Mobility Specialist Please contact via Special educational needs teacher or Rehab office at [phone]

## 2024-01-25 NOTE — Progress Notes (Signed)
   01/25/24 1958  BiPAP/CPAP/SIPAP  BiPAP/CPAP/SIPAP Pt Type Adult  BiPAP/CPAP/SIPAP DREAMSTATIOND  Mask Type Full face mask  Mask Size Large  Respiratory Rate 18 breaths/min  EPAP 10 cmH2O  FiO2 (%) 21 %  Patient Home Machine No  Patient Home Mask No  Patient Home Tubing No  Auto Titrate No  BiPAP/CPAP /SiPAP Vitals  Resp 18  Bilateral Breath Sounds Clear;Diminished  MEWS Score/Color  MEWS Score 0  MEWS Score Color Green

## 2024-01-25 NOTE — Progress Notes (Signed)
 Notified by therapy while ambulating, patient started to limp and c/o pain to left calf. This nurse assessed LLE and found patietn c/o tenderness to the mid posterior calf. No redness, warmth, or swelling assessed. Dr. Joneen Nelson made aware, BLE US  ordered, patient made aware of plan.

## 2024-01-25 NOTE — Progress Notes (Signed)
 Physical Therapy Treatment Patient Details Name: Caleb Leblanc MRN: [id number] DOB: Jun 23, 1961 Today's Date: 01/25/2024   History of Present Illness 63 year old with a history of obesity, OSA, HTN, and recent incarceration who was transported to the ER 01/18/24  from his halfway house after he was found by his roommate standing in one spot in his room not speaking or interacting.  On arrival in the ER the patient was not speaking nor would he move his arms or legs.    PT Comments  Patient progressing with ambulation distance despite pain in L calf and noted antalgia.  RN made aware with potential positive Homan's.   Patient appropriate for home when medically stable.  PT will continue to follow.  May benefit from walker vs cane at d/c.    If plan is discharge home, recommend the following: A little help with walking and/or transfers;A little help with bathing/dressing/bathroom;Assist for transportation;Help with stairs or ramp for entrance   Can travel by private vehicle        Equipment Recommendations  Rolling walker (2 wheels)    Recommendations for Other Services       Precautions / Restrictions Precautions Precautions: Fall     Mobility  Bed Mobility               General bed mobility comments: in chair    Transfers Overall transfer level: Needs assistance Equipment used: None Transfers: Sit to/from Stand Sit to Stand: Supervision           General transfer comment: S for safety, walker not close, used armrests    Ambulation/Gait Ambulation/Gait assistance: Supervision Gait Distance (Feet): 175 Feet Assistive device: Rolling walker (2 wheels) Gait Pattern/deviations: Step-through pattern, Decreased stride length, Antalgic, Decreased stance time - left       General Gait Details: discussed trying without walker, though pt reported wanted to keep for today; noted antalgic on L and pt states L calf pain just started today   Stairs              Wheelchair Mobility     Tilt Bed    Modified Rankin (Stroke Patients Only)       Balance Overall balance assessment: Needs assistance Sitting-balance support: Feet supported Sitting balance-Leahy Scale: Good     Standing balance support: No upper extremity supported Standing balance-Leahy Scale: Fair Standing balance comment: no walker to start in standing; using walker for ambulation and declined trying without, noted antalgia on L                            Communication Communication Communication: No apparent difficulties  Cognition Arousal: Alert Behavior During Therapy: WFL for tasks assessed/performed   PT - Cognitive impairments: No apparent impairments                         Following commands: Intact      Cueing Cueing Techniques: Verbal cues  Exercises      General Comments General comments (skin integrity, edema, etc.): tender with positive Homan's sign on L though not warm or red, maybe slightly more edema than R RN aware; toileted in bathroom while standing with distant S and washed hands at sink      Pertinent Vitals/Pain Pain Assessment Pain Score: 7  Pain Location: L calf Pain Descriptors / Indicators: Tightness, Sore, Tender Pain Intervention(s): Monitored during session, Other (comment) (RN made aware)  Home Living                          Prior Function            PT Goals (current goals can now be found in the care plan section) Progress towards PT goals: Progressing toward goals    Frequency    Min 2X/week      PT Plan      Co-evaluation              AM-PAC PT "6 Clicks" Mobility   Outcome Measure  Help needed turning from your back to your side while in a flat bed without using bedrails?: A Little Help needed moving from lying on your back to sitting on the side of a flat bed without using bedrails?: A Little Help needed moving to and from a bed to a chair (including a  wheelchair)?: None Help needed standing up from a chair using your arms (e.g., wheelchair or bedside chair)?: None Help needed to walk in hospital room?: A Little Help needed climbing 3-5 steps with a railing? : A Little 6 Click Score: 20    End of Session Equipment Utilized During Treatment: Gait belt Activity Tolerance: Patient tolerated treatment well Patient left: in chair;with call bell/phone within reach Nurse Communication: Other (comment) (L Calf pain) PT Visit Diagnosis: Other abnormalities of gait and mobility (R26.89)     Time: 5643-3295 PT Time Calculation (min) (ACUTE ONLY): 19 min  Charges:    $Gait Training: 8-22 mins PT General Charges $$ ACUTE PT VISIT: 1 Visit                     Abigail Hoff, PT Acute Rehabilitation Services [phone] 01/25/2024    Caleb Leblanc 01/25/2024, 2:18 PM

## 2024-01-25 NOTE — Progress Notes (Signed)
 LLE venous duplex has been completed.   Results can be found under chart review under CV PROC. 01/25/2024 7:52 PM Mckell Riecke RVT, RDMS

## 2024-01-25 NOTE — Progress Notes (Signed)
  Progress Note   Patient: Caleb Leblanc ZOX:[id number] DOB: 10/27/1960 DOA: 01/18/2024     5 DOS: the patient was seen and examined on 01/25/2024   Brief hospital course: 63 year old with a history of obesity, OSA, HTN, and recent incarceration who was transported to the ER from his halfway house after he was found by his roommate standing in one spot in his room not speaking or interacting. On arrival in the ER the patient was not speaking nor would he move his arms or legs. He had a history of a similar episode in 2021 at which time a full workup was unremarkable and ultimately malingering was felt to be the etiology. At presentation vital signs were stable. Labs were without severe derangement. Ethanol level was negative. CT head was without acute findings. Psychiatry was consulted   Assessment and Plan: Acute encephalopathy of unclear etiology CT head unrevealing -ethanol level negative -UDS negative -EEG unrevealing - no clinical evidence of acute infection - Ativan challenge given for possible catatonia with patient receiving a total of 14 mg of IV Ativan - TSH normal -CBGs normal - ammonia normal -cortisol normal -folic acid not low -B12 somewhat low at 268 given this clinical scenario -continue to monitor with slow clinical improvement -suspect pt may be approaching his baseline -Psychiatry following, now on zyprexa 5mg  at bedtime. Recs for outpatient appt with [facility] on d/c -Discussed with Psychiatry, anticipate possible d/c tomorrow   Modest B12 deficiency In this clinical setting I would prefer his B12 level be 400 or > -continue subcutaneous supplementation - will need follow-up in outpatient setting in 6-8 weeks   Mild hypokalemia Within norma limits   Frequent urgent bowel movements -on imodium as needed   HTN No indication for further adjustment in medical therapy -BP stable   Obesity    OSA Continue usual CPAP regimen nightly       Subjective: No complaints at this  time  Physical Exam: Vitals:   01/24/24 1718 01/24/24 1956 01/25/24 0807 01/25/24 1556  BP: (!) 148/74 128/72 132/78 125/79  Pulse: 87 78 79 88  Resp: 18 18 19 18   Temp: 98.4 F (36.9 C) 98.3 F (36.8 C)    TempSrc:  Oral    SpO2: 94% 94% 96% 93%  Weight:      Height:       General exam: Conversant, in no acute distress Respiratory system: normal chest rise, clear, no audible wheezing Cardiovascular system: regular rhythm, s1-s2 Gastrointestinal system: Nondistended, nontender, pos BS Central nervous system: No seizures, no tremors Extremities: No cyanosis, no joint deformities Skin: No rashes, no pallor Psychiatry: Affect normal // no auditory hallucinations   Data Reviewed:  There are no new results to review at this time.  Family Communication: Pt in room, family not at bedside  Disposition: Status is: Inpatient Remains inpatient appropriate because: severity of illness  Planned Discharge Destination: Home    Author: Cherylle Corwin, MD 01/25/2024 6:15 PM  For on call review [email or web address].

## 2024-01-26 ENCOUNTER — Other Ambulatory Visit (HOSPITAL_COMMUNITY): Payer: Self-pay

## 2024-01-26 DIAGNOSIS — F061 Catatonic disorder due to known physiological condition: Secondary | ICD-10-CM

## 2024-01-26 MED ORDER — OLANZAPINE 5 MG PO TABS
5.0000 mg | ORAL_TABLET | Freq: Every day | ORAL | 0 refills | Status: DC
Start: 2024-01-26 — End: 2024-02-23
  Filled 2024-01-26: qty 30, 30d supply, fill #0
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
## 2024-01-26 NOTE — Discharge Summary (Signed)
 Physician Discharge Summary   Patient: Caleb Leblanc MRN: [id number] DOB: January 21, 1961  Admit date:     01/18/2024  Discharge date: 01/26/24  Discharge Physician: Garnette Pelt   PCP: Patient, No Pcp Per   Recommendations at discharge:    Follow up with PCP in 1-2 weeks Follow up with outpatient Psychiatry as scheduled Recommend recheck B12 in 6-8 weeks  Discharge Diagnoses: Principal Problem:   AMS (altered mental status) Active Problems:   OSA (obstructive sleep apnea)   Morbid obesity due to excess calories (HCC)   HTN (hypertension)   Acute encephalopathy   Catatonia  Resolved Problems:   * No resolved hospital problems. *  Hospital Course: 63 year old with a history of obesity, OSA, HTN, and recent incarceration who was transported to the ER from his halfway house after he was found by his roommate standing in one spot in his room not speaking or interacting. On arrival in the ER the patient was not speaking nor would he move his arms or legs. He had a history of a similar episode in 2021 at which time a full workup was unremarkable and ultimately malingering was felt to be the etiology. At presentation vital signs were stable. Labs were without severe derangement. Ethanol level was negative. CT head was without acute findings. Psychiatry was consulted   Assessment and Plan: Acute encephalopathy of unclear etiology CT head unrevealing -ethanol level negative -UDS negative -EEG unrevealing - no clinical evidence of acute infection - Ativan  challenge given for possible catatonia with patient receiving a total of 14 mg of IV Ativan  - TSH normal -CBGs normal - ammonia normal -cortisol normal -folic acid not low -B12 somewhat low at 268 given this clinical scenario -continue to monitor with slow clinical improvement -suspect pt may be approaching his baseline -Psychiatry following, now on zyprexa  5mg  at bedtime. Recs for outpatient appt with [facility] on d/c   Modest B12 deficiency -  follow-up in outpatient setting in 6-8 weeks   Mild hypokalemia Within norma limits   Frequent urgent bowel movements -on imodium  as needed   HTN No indication for further adjustment in medical therapy -BP remained stable   Obesity    OSA Continue usual CPAP regimen nightly    Consultants: Psychiatry Procedures performed:   Disposition: Group home Diet recommendation:  Regular diet DISCHARGE MEDICATION: Allergies as of 01/26/2024       Reactions   Amoxicillin Anaphylaxis   Augmentin [amoxicillin-pot Clavulanate] Anaphylaxis        Medication List     STOP taking these medications    potassium chloride  10 MEQ tablet Commonly known as: KLOR-CON  M       TAKE these medications    amLODipine  10 MG tablet Commonly known as: NORVASC  Take 10 mg by mouth daily.   OLANZapine  5 MG tablet Commonly known as: ZYPREXA  Take 1 tablet (5 mg total) by mouth at bedtime.   polyvinyl alcohol  1.4 % ophthalmic solution Commonly known as: LIQUIFILM TEARS Place 1 drop into both eyes as needed for dry eyes.        Follow-up Information     [facility] Follow up.   Specialty: Urgent Care Why: Please follow up at 7:30am to complete intake Monday through Wednesday. First come first serve walk-in appointments to get estabished with care. Thank you! Contact information: [address] [place]  [id number] [phone]        [facility] Follow up.   Specialty: Behavioral Health Why: Here  is an additional resource for follow up outpatient community mental health resources. Contact information: [address] [place] [address] [phone]                Discharge Exam: Caleb Leblanc   01/23/24 1403 01/23/24 1956  Weight: 125.9 kg 127 kg   General exam: Awake, laying in bed, in nad Respiratory system: Normal respiratory effort, no wheezing Cardiovascular system: regular rate, s1, s2 Gastrointestinal  system: Soft, nondistended, positive BS Central nervous system: CN2-12 grossly intact, strength intact Extremities: Perfused, no clubbing Skin: Normal skin turgor, no notable skin lesions seen Psychiatry: Mood normal // no visual hallucinations   Condition at discharge: fair  The results of significant diagnostics from this hospitalization (including imaging, microbiology, ancillary and laboratory) are listed below for reference.   Imaging Studies: VAS US  LOWER EXTREMITY VENOUS (DVT) Result Date: 01/26/2024  Lower Venous DVT Study Patient Name:  Caleb Leblanc  Date of Exam:   01/25/2024 Medical Rec #: [id number]        Accession #:    [id number] Date of Birth: 05/21/1961        Patient Gender: M Patient Age:   16 years Exam Location:  [facility] Procedure:      VAS US  LOWER EXTREMITY VENOUS (DVT) Referring Phys: Debbie Bellucci --------------------------------------------------------------------------------  Indications: Pain.  Comparison Study: No previous exams Performing Technologist: Jody Hill RVT, RDMS  Examination Guidelines: A complete evaluation includes B-mode imaging, spectral Doppler, color Doppler, and power Doppler as needed of all accessible portions of each vessel. Bilateral testing is considered an integral part of a complete examination. Limited examinations for reoccurring indications may be performed as noted. The reflux portion of the exam is performed with the patient in reverse Trendelenburg.  +-----+---------------+---------+-----------+----------+--------------+ RIGHTCompressibilityPhasicitySpontaneityPropertiesThrombus Aging +-----+---------------+---------+-----------+----------+--------------+ CFV  Full           Yes      Yes                                 +-----+---------------+---------+-----------+----------+--------------+   +---------+---------------+---------+-----------+----------+--------------+ LEFT      CompressibilityPhasicitySpontaneityPropertiesThrombus Aging +---------+---------------+---------+-----------+----------+--------------+ CFV      Full           Yes      Yes                                 +---------+---------------+---------+-----------+----------+--------------+ SFJ      Full                                                        +---------+---------------+---------+-----------+----------+--------------+ FV Prox  Full           Yes      Yes                                 +---------+---------------+---------+-----------+----------+--------------+ FV Mid   Full           Yes      Yes                                 +---------+---------------+---------+-----------+----------+--------------+  FV DistalFull           Yes      Yes                                 +---------+---------------+---------+-----------+----------+--------------+ PFV      Full                                                        +---------+---------------+---------+-----------+----------+--------------+ POP      Full           Yes      Yes                                 +---------+---------------+---------+-----------+----------+--------------+ PTV      Full                                                        +---------+---------------+---------+-----------+----------+--------------+ PERO     Full                                                        +---------+---------------+---------+-----------+----------+--------------+     Summary: RIGHT: - No evidence of common femoral vein obstruction.   LEFT: - There is no evidence of deep vein thrombosis in the lower extremity.  - No cystic structure found in the popliteal fossa.  *See table(s) above for measurements and observations. Electronically signed by Norman Serve on 01/26/2024 at 9:07:33 AM.    Final    EEG adult Result Date: 01/19/2024 Shelton Arlin KIDD, MD     01/19/2024  5:45 PM Patient Name: Caleb Leblanc MRN: [id number] Epilepsy Attending: Arlin KIDD Shelton Referring Physician/Provider: Seena Marsa NOVAK, MD Date: 01/19/2024 Duration: 24.02 mins Patient history: 63yo M with ams. EEG to evaluate for seizure Level of alertness: Awake, asleep AEDs during EEG study: Ativan  Technical aspects: This EEG study was done with scalp electrodes positioned according to the 10-20 International system of electrode placement. Electrical activity was reviewed with band pass filter of 1-70Hz , sensitivity of 7 uV/mm, display speed of 35mm/sec with a 60Hz  notched filter applied as appropriate. EEG data were recorded continuously and digitally stored.  Video monitoring was available and reviewed as appropriate. Description: The posterior dominant rhythm consists of 9-10 Hz activity of moderate voltage (25-35 uV) seen predominantly in posterior head regions, symmetric and reactive to eye opening and eye closing. Sleep was characterized by vertex waves, sleep spindles (12 to 14 Hz), maximal frontocentral region. There is an excessive amount of 15 to 18 Hz beta activity  distributed symmetrically and diffusely. EEG also showed intermittent generalized 3-6Hz  theta-delta slowing. Hyperventilation and photic stimulation were not performed.   ABNORMALITY - Intermittent slow, generalized - Excessive beta, generalized IMPRESSION: This study is suggestive of mild diffuse encephalopathy. The excessive beta activity seen in the background is most likely due to the effect of  benzodiazepine and is a benign EEG pattern. No seizures or epileptiform discharges were seen throughout the recording. Arlin MALVA Krebs   CT HEAD WO CONTRAST Result Date: 01/18/2024 CLINICAL DATA:  Neuro deficit, acute, stroke suspected EXAM: CT HEAD WITHOUT CONTRAST TECHNIQUE: Contiguous axial images were obtained from the base of the skull through the vertex without intravenous contrast. RADIATION DOSE REDUCTION: This exam was performed according to the departmental  dose-optimization program which includes automated exposure control, adjustment of the mA and/or kV according to patient size and/or use of iterative reconstruction technique. COMPARISON:  08/15/2020 FINDINGS: Brain: Mild chronic microvascular ischemic changes throughout the deep white matter. No acute intracranial abnormality. Specifically, no hemorrhage, hydrocephalus, mass lesion, acute infarction, or significant intracranial injury. Vascular: No hyperdense vessel or unexpected calcification. Skull: No acute calvarial abnormality. Sinuses/Orbits: No acute findings Other: None IMPRESSION: Chronic microvascular ischemic changes throughout the deep white matter. No acute intracranial abnormality. Electronically Signed   By: Franky Crease M.D.   On: 01/18/2024 12:20    Microbiology: Results for orders placed or performed during the hospital encounter of 08/15/20  Urine culture     Status: None   Collection Time: 08/15/20  9:49 PM   Specimen: Urine, Clean Catch  Result Value Ref Range Status   Specimen Description   Final    URINE, CLEAN CATCH Performed at [facility], [address] [place], [place] [address]    Special Requests   Final    NONE Performed at [facility], [address] [place], [place] [address]    Culture   Final    NO GROWTH Performed at [facility], [address], [place], [place] [address]    Report Status 08/17/2020 FINAL  Final  Respiratory Panel by RT PCR (Flu A&B, Covid) - Nasopharyngeal Swab     Status: None   Collection Time: 08/15/20 11:47 PM   Specimen: Nasopharyngeal Swab  Result Value Ref Range Status   SARS Coronavirus 2 by RT PCR NEGATIVE NEGATIVE Final    Comment: (NOTE) SARS-CoV-2 target nucleic acids are NOT DETECTED.  The SARS-CoV-2 RNA is generally detectable in upper respiratoy specimens during the acute phase of infection. The lowest concentration of SARS-CoV-2 viral copies this assay can detect  is 131 copies/mL. A negative result does not preclude SARS-Cov-2 infection and should not be used as the sole basis for treatment or other patient management decisions. A negative result may occur with  improper specimen collection/handling, submission of specimen other than nasopharyngeal swab, presence of viral mutation(s) within the areas targeted by this assay, and inadequate number of viral copies (<131 copies/mL). A negative result must be combined with clinical observations, patient history, and epidemiological information. The expected result is Negative.  Fact Sheet for Patients:  https://www.moore.com/  Fact Sheet for Healthcare Providers:  https://www.young.biz/  This test is no t yet approved or cleared by the [place]  FDA and  has been authorized for detection and/or diagnosis of SARS-CoV-2 by FDA under an Emergency Use Authorization (EUA). This EUA will remain  in effect (meaning this test can be used) for the duration of the COVID-19 declaration under Section 564(b)(1) of the Act, 21 U.S.C. section 360bbb-3(b)(1), unless the authorization is terminated or revoked sooner.     Influenza A by PCR NEGATIVE NEGATIVE Final   Influenza B by PCR NEGATIVE NEGATIVE Final    Comment: (NOTE) The Xpert Xpress SARS-CoV-2/FLU/RSV assay is intended as an aid in  the diagnosis of influenza  from Nasopharyngeal swab specimens and  should not be used as a sole basis for treatment. Nasal washings and  aspirates are unacceptable for Xpert Xpress SARS-CoV-2/FLU/RSV  testing.  Fact Sheet for Patients: https://www.moore.com/  Fact Sheet for Healthcare Providers: https://www.young.biz/  This test is not yet approved or cleared by the [place]  FDA and  has been authorized for detection and/or diagnosis of SARS-CoV-2 by  FDA under an Emergency Use Authorization (EUA). This EUA will remain  in effect  (meaning this test can be used) for the duration of the  Covid-19 declaration under Section 564(b)(1) of the Act, 21  U.S.C. section 360bbb-3(b)(1), unless the authorization is  terminated or revoked. Performed at [facility], [address] [place], [place] [address]   Culture, blood (routine x 2)     Status: None   Collection Time: 08/16/20  1:13 AM   Specimen: BLOOD  Result Value Ref Range Status   Specimen Description   Final    BLOOD RIGHT ANTECUBITAL Performed at [facility], [address] [place], [place] [address]    Special Requests   Final    BOTTLES DRAWN AEROBIC AND ANAEROBIC Blood Culture adequate volume Performed at [facility], [address], [place], [place] [address]    Culture   Final    NO GROWTH 5 DAYS Performed at [facility], [address], [place], [place] [address]    Report Status 08/21/2020 FINAL  Final    Labs: CBC: Recent Labs  Lab 01/20/24 1334 01/25/24 0458  WBC 6.8 5.8  HGB 13.5 13.8  HCT 42.4 42.6  MCV 85.7 84.7  PLT 244 258   Basic Metabolic Panel: Recent Labs  Lab 01/20/24 1334 01/21/24 0835 01/23/24 0502 01/25/24 0458  NA 140 144 140 140  K 3.6 3.6 3.5 4.1  CL 106 110 105 103  CO2 26 25 27 26   GLUCOSE 83 79 105* 107*  BUN 12 11 12 12   CREATININE 0.93 1.01 1.11 1.02  CALCIUM 8.3* 8.3* 8.5* 8.9  MG  --  2.4  --   --    Liver Function Tests: Recent Labs  Lab 01/21/24 0835 01/25/24 0458  AST 22 95*  ALT 27 66*  ALKPHOS 28* 41  BILITOT 0.7 0.5  PROT 7.3 7.2  ALBUMIN 2.8* 2.9*   CBG: Recent Labs  Lab 01/23/24 1710  GLUCAP 123*    Discharge time spent: less than 30 minutes.  Signed: Garnette Pelt, MD [facility] 01/26/2024

## 2024-01-26 NOTE — Plan of Care (Signed)
  Problem: Health Behavior/Discharge Planning: Goal: Ability to manage health-related needs will improve Outcome: Progressing   Problem: Clinical Measurements: Goal: Ability to maintain clinical measurements within normal limits will improve Outcome: Progressing   Problem: Activity: Goal: Risk for activity intolerance will decrease Outcome: Progressing   Problem: Nutrition: Goal: Adequate nutrition will be maintained Outcome: Progressing   Problem: Coping: Goal: Level of anxiety will decrease Outcome: Progressing   Problem: Elimination: Goal: Will not experience complications related to bowel motility Outcome: Progressing Goal: Will not experience complications related to urinary retention Outcome: Progressing   Problem: Pain Managment: Goal: General experience of comfort will improve and/or be controlled Outcome: Progressing   Problem: Safety: Goal: Ability to remain free from injury will improve Outcome: Progressing   Problem: Skin Integrity: Goal: Risk for impaired skin integrity will decrease Outcome: Progressing

## 2024-01-26 NOTE — Plan of Care (Signed)
  Problem: Health Behavior/Discharge Planning: Goal: Ability to manage health-related needs will improve Outcome: Adequate for Discharge   Problem: Clinical Measurements: Goal: Ability to maintain clinical measurements within normal limits will improv

## 2024-01-26 NOTE — TOC Progression Note (Signed)
 Transition of Care [facility]) - Progression Note    Patient Details  Name: Caleb Leblanc MRN: [id number] Date of Birth: May 12, 1961  Transition of Care [facility]) CM/SW Contact  Montie LOISE Louder, [place] Phone Number: 01/26/2024, 3:32 PM  Clinical Narrative:     CSW met with patient. CSW introduced self and explained role. Patient confirmed disposition plan to return to [facility].   CSW spoke with D. Rivers @ [phone]- advised  patient will return today. Informed patient will be transported by taxi.   Montie Louder, MSW, LCSW Clinical Social Worker      Barriers to Discharge: Barriers Resolved  Expected Discharge Plan and Services         Expected Discharge Date: 01/26/24                                     Social Determinants of Health (SDOH) Interventions SDOH Screenings   Food Insecurity: No Food Insecurity (01/18/2024)  Housing: Low Risk  (01/18/2024)  Transportation Needs: No Transportation Needs (01/18/2024)  Utilities: Not At Risk (01/18/2024)  Social Connections: Socially Isolated (01/18/2024)  Tobacco Use: Medium Risk (01/18/2024)    Readmission Risk Interventions     No data to display

## 2024-01-26 NOTE — Progress Notes (Signed)
 Occupational Therapy Treatment Patient Details Name: Caleb Leblanc MRN: [id number] DOB: 18-Feb-1961 Today's Date: 01/26/2024   History of present illness 63 year old with a history of obesity, OSA, HTN, and recent incarceration who was transported to the ER 01/18/24  from his halfway house after he was found by his roommate standing in one spot in his room not speaking or interacting.  On arrival in the ER the patient was not speaking nor would he move his arms or legs.   OT comments  Pt. Seen for skilled OT treatment session.  Pt. Able to complete LB dressing with set up.  Amb. In  room to/from b.room for toileting with S/CGA.  Pt. Reports feeling well and had no questions or concerns.  Cont. With acute OT POC.        If plan is discharge home, recommend the following:  A little help with walking and/or transfers;A little help with bathing/dressing/bathroom;Assistance with cooking/housework;Assist for transportation;Help with stairs or ramp for entrance   Equipment Recommendations  Other (comment)    Recommendations for Other Services      Precautions / Restrictions Precautions Precautions: Fall Recall of Precautions/Restrictions: Intact       Mobility Bed Mobility               General bed mobility comments: in chair    Transfers Overall transfer level: Needs assistance Equipment used: None Transfers: Sit to/from Stand, Bed to chair/wheelchair/BSC Sit to Stand: Supervision     Step pivot transfers: Contact guard assist           Balance                                           ADL either performed or assessed with clinical judgement   ADL Overall ADL's : Needs assistance/impaired     Grooming: Wash/dry hands;Standing;Supervision/safety               Lower Body Dressing: Supervision/safety;Sitting/lateral leans Lower Body Dressing Details (indicate cue type and reason): able to don B shoes, some difficulty only because of the  thick gripper socks being used as reg. socks. pt. reports he donned pants without issue prior to my arrival. Toilet Transfer: Supervision/safety;Ambulation;Regular Teacher, adult education Details (indicate cue type and reason): stood to urinate, managed raising and lowering lid without difficulty         Functional mobility during ADLs: Contact guard assist General ADL Comments: CGA for in room ambulation, states he has not been using any DME.    Extremity/Trunk Assessment              Vision       Restaurant manager, fast food Communication: No apparent difficulties   Cognition Arousal: Alert Behavior During Therapy: WFL for tasks assessed/performed Cognition: No apparent impairments                               Following commands: Intact Following commands impaired: Only follows one step commands consistently      Cueing   Cueing Techniques: Verbal cues  Exercises      Shoulder Instructions       General Comments      Pertinent Vitals/ Pain       Pain Assessment Pain Assessment: Faces Faces Pain Scale: Hurts a  little bit Pain Location: L calf Pain Descriptors / Indicators: Tender  Home Living                                          Prior Functioning/Environment              Frequency  Min 2X/week        Progress Toward Goals  OT Goals(current goals can now be found in the care plan section)  Progress towards OT goals: Progressing toward goals     Plan      Co-evaluation                 AM-PAC OT "6 Clicks" Daily Activity     Outcome Measure   Help from another person eating meals?: None Help from another person taking care of personal grooming?: A Little Help from another person toileting, which includes using toliet, bedpan, or urinal?: A Little Help from another person bathing (including washing, rinsing, drying)?: A Little Help from another person to put on and  taking off regular upper body clothing?: A Little Help from another person to put on and taking off regular lower body clothing?: A Little 6 Click Score: 19    End of Session    OT Visit Diagnosis: Unsteadiness on feet (R26.81);Other abnormalities of gait and mobility (R26.89);Muscle weakness (generalized) (M62.81)   Activity Tolerance Patient tolerated treatment well   Patient Left in chair;with call bell/phone within reach   Nurse Communication Other (comment) (rn states ok to work with pt.)        Time: 1610-9604 OT Time Calculation (min): 8 min  Charges: OT General Charges $OT Visit: 1 Visit OT Treatments $Self Care/Home Management : 8-22 mins  Howell Macintosh, COTA/L Acute Rehabilitation [phone]   Mechele Spiegel 01/26/2024, 12:23 PM

## 2024-01-26 NOTE — Discharge Instructions (Signed)
 Follow up recommendation, free or reduced fees for services to [facility] Residents: [facility] [address]  [phone]

## 2024-01-26 NOTE — Progress Notes (Signed)
 Mobility Specialist Progress Note:    01/26/24 0840  Therapy Vitals  Temp 98.3 F (36.8 C)  Pulse Rate 95  Resp 18  BP (!) 143/82  Oxygen Therapy  SpO2 99 %  Mobility  Activity Ambulated with assistance in hallway  Level of Assistance Contact guard assist, steadying assist  Assistive Device Front wheel walker  Distance Ambulated (ft) 200 ft  Activity Response Tolerated well  Mobility Referral Yes  Mobility visit 1 Mobility  Mobility Specialist Start Time (ACUTE ONLY) 0831  Mobility Specialist Stop Time (ACUTE ONLY) 0836  Mobility Specialist Time Calculation (min) (ACUTE ONLY) 5 min   Pt received on EOB and agreeable. Required only contact guard throughout, w/ no complaints. Returned to room w/o fault. Pt left on EOB w/ call bell near and NT present.  D'Vante Nolon Baxter Mobility Specialist Please contact via Special educational needs teacher or Rehab office at [phone]

## 2024-01-29 ENCOUNTER — Other Ambulatory Visit: Payer: Self-pay

## 2024-01-29 ENCOUNTER — Encounter (HOSPITAL_COMMUNITY): Payer: Self-pay

## 2024-01-29 ENCOUNTER — Emergency Department (HOSPITAL_COMMUNITY)
Admission: EM | Admit: 2024-01-29 | Discharge: 2024-01-29 | Discharge status: Against Medical Advice | Payer: Self-pay | Attending: Emergency Medicine | Admitting: Emergency Medicine

## 2024-01-29 ENCOUNTER — Encounter (HOSPITAL_COMMUNITY): Payer: Self-pay | Admitting: Emergency Medicine

## 2024-01-29 DIAGNOSIS — Z5329 Procedure and treatment not carried out because of patient's decision for other reasons: Secondary | ICD-10-CM | POA: Insufficient documentation

## 2024-01-29 DIAGNOSIS — I10 Essential (primary) hypertension: Secondary | ICD-10-CM | POA: Insufficient documentation

## 2024-01-29 DIAGNOSIS — R464 Slowness and poor responsiveness: Secondary | ICD-10-CM | POA: Insufficient documentation

## 2024-01-29 DIAGNOSIS — R2990 Unspecified symptoms and signs involving the nervous system: Secondary | ICD-10-CM

## 2024-01-29 DIAGNOSIS — Y900 Blood alcohol level of less than 20 mg/100 ml: Secondary | ICD-10-CM | POA: Insufficient documentation

## 2024-01-29 DIAGNOSIS — Z5321 Procedure and treatment not carried out due to patient leaving prior to being seen by health care provider: Secondary | ICD-10-CM | POA: Insufficient documentation

## 2024-01-29 DIAGNOSIS — R55 Syncope and collapse: Secondary | ICD-10-CM | POA: Insufficient documentation

## 2024-01-29 DIAGNOSIS — Z79899 Other long term (current) drug therapy: Secondary | ICD-10-CM | POA: Insufficient documentation

## 2024-01-29 DIAGNOSIS — R202 Paresthesia of skin: Secondary | ICD-10-CM | POA: Insufficient documentation

## 2024-01-29 LAB — RAPID URINE DRUG SCREEN, HOSP PERFORMED
Amphetamines: NOT DETECTED
Barbiturates: NOT DETECTED
Benzodiazepines: NOT DETECTED
Cocaine: NOT DETECTED
Opiates: NOT DETECTED
Tetrahydrocannabinol: NOT DETECTED

## 2024-01-29 LAB — URINALYSIS, ROUTINE W REFLEX MICROSCOPIC
Bacteria, UA: NONE SEEN
Bilirubin Urine: NEGATIVE
Glucose, UA: NEGATIVE mg/dL
Ketones, ur: 5 mg/dL — AB
Leukocytes,Ua: NEGATIVE
Nitrite: NEGATIVE
Protein, ur: NEGATIVE mg/dL
Specific Gravity, Urine: 1.027 (ref 1.005–1.030)
pH: 5 (ref 5.0–8.0)

## 2024-01-29 LAB — CBC WITH DIFFERENTIAL/PLATELET
Abs Immature Granulocytes: 0.03 10*3/uL (ref 0.00–0.07)
Basophils Absolute: 0 10*3/uL (ref 0.0–0.1)
Basophils Relative: 0 %
Eosinophils Absolute: 0.1 10*3/uL (ref 0.0–0.5)
Eosinophils Relative: 1 %
HCT: 44.3 % (ref 39.0–52.0)
Hemoglobin: 13.8 g/dL (ref 13.0–17.0)
Immature Granulocytes: 0 %
Lymphocytes Relative: 30 %
Lymphs Abs: 2.6 10*3/uL (ref 0.7–4.0)
MCH: 27.2 pg (ref 26.0–34.0)
MCHC: 31.2 g/dL (ref 30.0–36.0)
MCV: 87.2 fL (ref 80.0–100.0)
Monocytes Absolute: 1 10*3/uL (ref 0.1–1.0)
Monocytes Relative: 11 %
Neutro Abs: 5 10*3/uL (ref 1.7–7.7)
Neutrophils Relative %: 58 %
Platelets: 289 10*3/uL (ref 150–400)
RBC: 5.08 MIL/uL (ref 4.22–5.81)
RDW: 14.7 % (ref 11.5–15.5)
WBC: 8.7 10*3/uL (ref 4.0–10.5)
nRBC: 0 % (ref 0.0–0.2)

## 2024-01-29 LAB — I-STAT CHEM 8, ED
BUN: 33 mg/dL — ABNORMAL HIGH (ref 8–23)
Calcium, Ion: 0.85 mmol/L — CL (ref 1.15–1.40)
Chloride: 111 mmol/L (ref 98–111)
Creatinine, Ser: 1.1 mg/dL (ref 0.61–1.24)
Glucose, Bld: 112 mg/dL — ABNORMAL HIGH (ref 70–99)
HCT: 39 % (ref 39.0–52.0)
Hemoglobin: 13.3 g/dL (ref 13.0–17.0)
Potassium: 5.3 mmol/L — ABNORMAL HIGH (ref 3.5–5.1)
Sodium: 136 mmol/L (ref 135–145)
TCO2: 22 mmol/L (ref 22–32)

## 2024-01-29 LAB — COMPREHENSIVE METABOLIC PANEL WITH GFR
ALT: 37 U/L (ref 0–44)
AST: 27 U/L (ref 15–41)
Albumin: 3.9 g/dL (ref 3.5–5.0)
Alkaline Phosphatase: 39 U/L (ref 38–126)
Anion gap: 11 (ref 5–15)
BUN: 22 mg/dL (ref 8–23)
CO2: 24 mmol/L (ref 22–32)
Calcium: 9 mg/dL (ref 8.9–10.3)
Chloride: 104 mmol/L (ref 98–111)
Creatinine, Ser: 0.95 mg/dL (ref 0.61–1.24)
GFR, Estimated: >60 mL/min (ref >60)
Glucose, Bld: 96 mg/dL (ref 70–99)
Potassium: 3.5 mmol/L (ref 3.5–5.1)
Sodium: 139 mmol/L (ref 135–145)
Total Bilirubin: 0.9 mg/dL (ref 0.0–1.2)
Total Protein: 9 g/dL — ABNORMAL HIGH (ref 6.5–8.1)

## 2024-01-29 LAB — CBG MONITORING, ED: Glucose-Capillary: 117 mg/dL — ABNORMAL HIGH (ref 70–99)

## 2024-01-29 LAB — MAGNESIUM: Magnesium: 2.5 mg/dL — ABNORMAL HIGH (ref 1.7–2.4)

## 2024-01-29 LAB — ETHANOL
Alcohol, Ethyl (B): <10 mg/dL (ref <10)
Alcohol, Ethyl (B): <10 mg/dL (ref <10)

## 2024-01-29 NOTE — Evaluation (Addendum)
 [facility] team were consulted to evaluate patient for admission.However, patient was not in his bed in the ED. Nurses report seeing patient walking outside.Unclear if patient has signed out AMA or Eloped from the ED. Discussed with ED provider Annita Kindle to re-consult if needed.

## 2024-01-29 NOTE — ED Provider Notes (Signed)
 [place] EMERGENCY DEPARTMENT AT [facility] Provider Note   CSN: [id number] Arrival date & time: 01/29/24  1441     History  Chief Complaint  Patient presents with   Psychiatric Evaluation    Caleb Leblanc is a 63 y.o. male with PMH as listed below who presents brought in from [facility] house with reports of "unresponsiveness." Patient recently admitted  from 01/18/24-01/26/24 with extensive workup for same. No etiology was found during workup. He was started on Zyprexa . He is reported to have the same symptoms again today. He is a poor historian but relays that he had an episode of difficulty "communicating" earlier today and presents for help with his "communication." On further questioning it seems as though patient had an episode where he couldn't speak, couldn't find words, was trying to talk but could not. He also endorses numbness/tingling in the left arm/hand. He states he has had this "communication" problem intermittently for "awhile." He remembers the whole incident and denies any LOC. Denies drug/alcohol  use. Denies headache, visual changes, facial droop, focal asymmetric weakness, falls/head trauma.   Past Medical History:  Diagnosis Date   Hypertension        Home Medications Prior to Admission medications   Medication Sig Start Date End Date Taking? Authorizing Provider  amLODipine  (NORVASC ) 10 MG tablet Take 10 mg by mouth daily.    [provider]  OLANZapine  (ZYPREXA ) 5 MG tablet Take 1 tablet (5 mg total) by mouth at bedtime. 01/26/24 02/25/24  Oral Billings, MD  polyvinyl alcohol  (LIQUIFILM TEARS) 1.4 % ophthalmic solution Place 1 drop into both eyes as needed for dry eyes. 08/19/20   Bary Boss, DO      Allergies    Amoxicillin and Augmentin [amoxicillin-pot clavulanate]    Review of Systems   Review of Systems A 10 point review of systems was performed and is negative unless otherwise reported in HPI.  Physical Exam Updated Vital  Signs BP 116/75   Pulse 90   Temp 98.8 F (37.1 C)   Resp 18   SpO2 94%  Physical Exam General: Normal appearing male, lying in bed.  HEENT: PERRLA, EOMI, no nystagmus, Sclera anicteric, MMM, trachea midline. No facial droop.  Cardiology: RRR, no murmurs/rubs/gallops. BL radial and DP pulses equal bilaterally.  Resp: Normal respiratory rate and effort. CTAB, no wheezes, rhonchi, crackles.  Abd: Soft, non-tender, non-distended. No rebound tenderness or guarding.  GU: Deferred. MSK: No peripheral edema or signs of trauma. Extremities without deformity or TTP. No cyanosis or clubbing. Skin: warm, dry. No rashes or lesions. Back: No CVA tenderness Neuro: A&Ox4, CNs II-XII grossly intact. 5/5 strength all extremities. Sensation grossly intact. Mild difficulties with speech intermittently.   1a  Level of consciousness: 0=alert; keenly responsive  1b. LOC questions:  0=Performs both tasks correctly  1c. LOC commands: 0=Performs both tasks correctly  2.  Best Gaze: 0=normal  3.  Visual: 0=No visual loss  4. Facial Palsy: 0=Normal symmetric movement  5a.  Motor left arm: 0=No drift, limb holds 90 (or 45) degrees for full 10 seconds  5b.  Motor right arm: 0=No drift, limb holds 90 (or 45) degrees for full 10 seconds  6a. motor left leg: 0=No drift, limb holds 90 (or 45) degrees for full 10 seconds  6b  Motor right leg:  0=No drift, limb holds 90 (or 45) degrees for full 10 seconds  7. Limb Ataxia: 0=Absent  8.  Sensory: 0=Normal; no sensory loss  9.  Best Language:  1=Mild to moderate aphasia; some obvious loss of fluency or facility of comprehension without significant limitation on ideas expressed or form of expression.  10. Dysarthria: 0=Normal  11. Extinction and Inattention: 0=No abnormality   Total:   1        ED Results / Procedures / Treatments   Labs (all labs ordered are listed, but only abnormal results are displayed) Labs Reviewed  COMPREHENSIVE METABOLIC PANEL WITH GFR -  Abnormal; Notable for the following components:      Result Value   Total Protein 9.0 (*)    All other components within normal limits  MAGNESIUM - Abnormal; Notable for the following components:   Magnesium 2.5 (*)    All other components within normal limits  URINALYSIS, ROUTINE W REFLEX MICROSCOPIC - Abnormal; Notable for the following components:   APPearance HAZY (*)    Hgb urine dipstick SMALL (*)    Ketones, ur 5 (*)    All other components within normal limits  CBC WITH DIFFERENTIAL/PLATELET  ETHANOL  RAPID URINE DRUG SCREEN, HOSP PERFORMED    EKG EKG Interpretation Date/Time:  Monday January 29 2024 17:39:35 EDT Ventricular Rate:  90 PR Interval:  142 QRS Duration:  95 QT Interval:  445 QTC Calculation: 545 R Axis:   -27  Text Interpretation: Sinus rhythm Borderline left axis deviation Abnormal R-wave progression, late transition Borderline T abnormalities, anterior leads Prolonged QT interval Confirmed by Annita Kindle [phone]) on 01/29/2024 6:16:48 PM  Radiology No results found.  Procedures Procedures    Medications Ordered in ED Medications - No data to display  ED Course/ Medical Decision Making/ A&P                          Medical Decision Making Amount and/or Complexity of Data Reviewed Labs: ordered. Decision-making details documented in ED Course. Radiology: ordered.    This patient presents to the ED for concern of difficulty communicating, this involves an extensive number of treatment options, and is a complaint that carries with it a high risk of complications and morbidity.  I considered the following differential and admission for this acute, potentially life threatening condition.   MDM:    Consider psychiatric abnormality vs medical abnormality. Per chart review, patient hasn't had brain MRI since 2021. It is possible that his sxs are related to aphasia or possible TIA/stroke. Didn't have stroke w/u while inpatient. He is A&Ox4, responsive  to questions, no signs of catatonia or psychiatric emergency at this time. Lab w/u is unremarkable including CBC, CBC, EtOH, UDS, UA. NIHSS would be 1 for mild speech difficulty, hard to know if related to psychiatric issue or cerebral. Patient does have c/o LUE sxs as well which raises c/f TIA. Sxs seem intermittent, not able to determine LKN, not candidate for TNK. Patient will be admitted to medicine for stroke w/u and MRI. Unfortunately patient did elope from ED prior to admission or further imaging.   Clinical Course as of 01/29/24 1938  Mon Jan 29, 2024  1803 CBC with Differential neg [HN]  1931 Patient was admitted to medicine but patient eloped from the ED.  [HN]    Clinical Course User Index [HN] Merdis Stalling, MD    Labs: I Ordered, and personally interpreted labs.  The pertinent results include:  those listed above  Imaging Studies ordered: I ordered imaging studies including MRI brain wo contrast  Additional history obtained from chart review.  Social Determinants of Health: Lives at halfway house  Disposition:  Eloped from ED  Co morbidities that complicate the patient evaluation  Past Medical History:  Diagnosis Date   Hypertension      Medicines No orders of the defined types were placed in this encounter.   I have reviewed the patients home medicines and have made adjustments as needed  Problem List / ED Course: Problem List Items Addressed This Visit   None Visit Diagnoses       Stroke-like symptoms    -  Primary                   This note was created using dictation software, which may contain spelling or grammatical errors.    Merdis Stalling, MD 01/29/24 [phone]

## 2024-01-29 NOTE — ED Notes (Signed)
 Patient awake now and alert

## 2024-01-29 NOTE — ED Triage Notes (Signed)
 Patient stated he wants to be seen so he can go home. Patient denies SI/HI. [facility] at [facility].

## 2024-01-29 NOTE — ED Notes (Signed)
 Patient wakes up to ammonia inhalant answer some questions and goes back to sleep.

## 2024-01-29 NOTE — ED Provider Triage Note (Signed)
 Emergency Medicine Provider Triage Evaluation Note  Caleb Leblanc , a 63 y.o. male  was evaluated in triage.  Pt complains of unresponsive.  Review of Systems  Positive: None Negative: None  Physical Exam  BP (!) 158/96   Pulse 81   Resp 14   Ht 1.753 m (5\' 9" )   Wt 126.6 kg   SpO2 94%   BMI 41.20 kg/m  Gen:   Awake, no distress patient initially unresponsive but in wheelchair does not appear to be in any acute distress-he is answering some my questions but is difficult to understand as his positions appear to be mumbled Resp:  Normal effort normal oxygen saturations MSK:   Moves extremities without difficulty patient completely dressed in wheelchair Other:  Patient sitting in wheelchair  Medical Decision Making  Medically screening exam initiated at 9:31 AM.  Appropriate orders placed.  Caleb Leblanc was informed that the remainder of the evaluation will be completed by another provider, this initial triage assessment does not replace that evaluation, and the importance of remaining in the ED until their evaluation is complete.  63 year old man brought in from [facility] house with reports of unresponsiveness.  Patient recently admitted with extensive workup for same.  No etiology was found during workup.  He was started on Zyprexa .  He was discharged back to [facility] house.  He is reported to have the same symptoms again today.   Caleb Blush, MD 01/29/24 [phone]

## 2024-01-29 NOTE — ED Notes (Signed)
 Left AMA

## 2024-01-29 NOTE — ED Triage Notes (Signed)
 Patient brought in from [facility] for "unresponsiveness".  Patient was just worked up 3 days ago for same with unremarkable exam.  Patient was told to follow up outpatient psych.  When attempting to open patients eyes he will close them and blink with saline drops.  Patient will not answer questions vital signs normal

## 2024-01-31 ENCOUNTER — Emergency Department (HOSPITAL_COMMUNITY): Payer: Self-pay

## 2024-01-31 ENCOUNTER — Emergency Department (HOSPITAL_COMMUNITY)
Admission: EM | Admit: 2024-01-31 | Discharge: 2024-02-04 | Disposition: A | Discharge status: Other Acute Inpatient Hospital | Payer: Self-pay | Attending: Emergency Medicine | Admitting: Emergency Medicine

## 2024-01-31 ENCOUNTER — Other Ambulatory Visit: Payer: Self-pay

## 2024-01-31 DIAGNOSIS — F329 Major depressive disorder, single episode, unspecified: Secondary | ICD-10-CM

## 2024-01-31 DIAGNOSIS — F419 Anxiety disorder, unspecified: Secondary | ICD-10-CM | POA: Insufficient documentation

## 2024-01-31 DIAGNOSIS — R4182 Altered mental status, unspecified: Secondary | ICD-10-CM | POA: Insufficient documentation

## 2024-01-31 DIAGNOSIS — I10 Essential (primary) hypertension: Secondary | ICD-10-CM | POA: Insufficient documentation

## 2024-01-31 DIAGNOSIS — F061 Catatonic disorder due to known physiological condition: Secondary | ICD-10-CM | POA: Diagnosis present

## 2024-01-31 DIAGNOSIS — Z79899 Other long term (current) drug therapy: Secondary | ICD-10-CM | POA: Insufficient documentation

## 2024-01-31 LAB — COMPREHENSIVE METABOLIC PANEL WITH GFR
ALT: 31 U/L (ref 0–44)
AST: 24 U/L (ref 15–41)
Albumin: 3.5 g/dL (ref 3.5–5.0)
Alkaline Phosphatase: 38 U/L (ref 38–126)
Anion gap: 10 (ref 5–15)
BUN: 25 mg/dL — ABNORMAL HIGH (ref 8–23)
CO2: 24 mmol/L (ref 22–32)
Calcium: 8.5 mg/dL — ABNORMAL LOW (ref 8.9–10.3)
Chloride: 104 mmol/L (ref 98–111)
Creatinine, Ser: 1.12 mg/dL (ref 0.61–1.24)
GFR, Estimated: >60 mL/min (ref >60)
Glucose, Bld: 100 mg/dL — ABNORMAL HIGH (ref 70–99)
Potassium: 3.4 mmol/L — ABNORMAL LOW (ref 3.5–5.1)
Sodium: 138 mmol/L (ref 135–145)
Total Bilirubin: 0.8 mg/dL (ref 0.0–1.2)
Total Protein: 8.2 g/dL — ABNORMAL HIGH (ref 6.5–8.1)

## 2024-01-31 LAB — CBC WITH DIFFERENTIAL/PLATELET
Abs Immature Granulocytes: 0.02 10*3/uL (ref 0.00–0.07)
Basophils Absolute: 0 10*3/uL (ref 0.0–0.1)
Basophils Relative: 0 %
Eosinophils Absolute: 0 10*3/uL (ref 0.0–0.5)
Eosinophils Relative: 1 %
HCT: 44.1 % (ref 39.0–52.0)
Hemoglobin: 13.5 g/dL (ref 13.0–17.0)
Immature Granulocytes: 0 %
Lymphocytes Relative: 23 %
Lymphs Abs: 1.7 10*3/uL (ref 0.7–4.0)
MCH: 27.2 pg (ref 26.0–34.0)
MCHC: 30.6 g/dL (ref 30.0–36.0)
MCV: 88.7 fL (ref 80.0–100.0)
Monocytes Absolute: 0.8 10*3/uL (ref 0.1–1.0)
Monocytes Relative: 10 %
Neutro Abs: 5 10*3/uL (ref 1.7–7.7)
Neutrophils Relative %: 66 %
Platelets: 269 10*3/uL (ref 150–400)
RBC: 4.97 MIL/uL (ref 4.22–5.81)
RDW: 14.7 % (ref 11.5–15.5)
WBC: 7.6 10*3/uL (ref 4.0–10.5)
nRBC: 0 % (ref 0.0–0.2)

## 2024-01-31 LAB — ETHANOL: Alcohol, Ethyl (B): <15 mg/dL (ref <15)

## 2024-01-31 LAB — CBG MONITORING, ED: Glucose-Capillary: 86 mg/dL (ref 70–99)

## 2024-01-31 LAB — AMMONIA: Ammonia: 17 umol/L (ref 9–35)

## 2024-01-31 MED ORDER — OLANZAPINE 5 MG PO TABS: 5.0000 mg | ORAL_TABLET | Freq: Every day | ORAL | Status: DC

## 2024-01-31 MED ORDER — AMLODIPINE BESYLATE 5 MG PO TABS
10.0000 mg | ORAL_TABLET | Freq: Every day | ORAL | Status: DC
  Administered 2024-02-02 – 2024-02-04 (×3): 10 mg via ORAL
  Filled 2024-01-31 (×3): qty 2

## 2024-01-31 NOTE — ED Triage Notes (Signed)
 Pt was found by the side of the road having a seizure. EMS states that it was witnessed by fire department. Pt is lethargic, but will answer questions.

## 2024-01-31 NOTE — ED Notes (Signed)
Seizure pads placed on rails of stretcher ?

## 2024-01-31 NOTE — ED Provider Notes (Signed)
 [place] EMERGENCY DEPARTMENT AT [facility] Provider Note   CSN: [id number] Arrival date & time: 01/31/24  1708     History {Add pertinent medical, surgical, social history, OB history to HPI:1} Chief Complaint  Patient presents with   Seizures    Caleb Leblanc is a 63 y.o. male.  Patient brought in by EMS from side of road.  Possibly had some seizure-like activity.  Patient is awake here slow to answer, complaining of feeling cold.  Of note patient was admitted for 10-4 18 with extensive workup for altered mental status.  Was here again 4/21 for unresponsive and ultimately eloped.  Unclear if has seizure history.  No headache chest pain abdominal pain.  The history is provided by the patient and the EMS personnel.  Altered Mental Status Presenting symptoms: partial responsiveness   Most recent episode:  Today Associated symptoms: seizures (??)        Home Medications Prior to Admission medications   Medication Sig Start Date End Date Taking? Authorizing Provider  amLODipine  (NORVASC ) 10 MG tablet Take 10 mg by mouth daily.    [provider]  OLANZapine  (ZYPREXA ) 5 MG tablet Take 1 tablet (5 mg total) by mouth at bedtime. 01/26/24 02/25/24  Oral Billings, MD  polyvinyl alcohol  (LIQUIFILM TEARS) 1.4 % ophthalmic solution Place 1 drop into both eyes as needed for dry eyes. 08/19/20   Bary Boss, DO      Allergies    Amoxicillin and Augmentin [amoxicillin-pot clavulanate]    Review of Systems   Review of Systems  Unable to perform ROS: Mental status change  Neurological:  Positive for seizures (??).    Physical Exam Updated Vital Signs BP 129/84 (BP Location: Left Arm)   Pulse 89   Temp 98.7 F (37.1 C) (Oral)   Resp 13   Ht 5\' 9"  (1.753 m)   Wt 126 kg   SpO2 97%   BMI 41.02 kg/m  Physical Exam Vitals and nursing note reviewed.  Constitutional:      General: He is not in acute distress.    Appearance: Normal appearance. He is  well-developed.  HENT:     Head: Normocephalic and atraumatic.  Eyes:     Extraocular Movements: Extraocular movements intact.     Conjunctiva/sclera: Conjunctivae normal.     Pupils: Pupils are equal, round, and reactive to light.  Cardiovascular:     Rate and Rhythm: Normal rate and regular rhythm.     Heart sounds: No murmur heard. Pulmonary:     Effort: Pulmonary effort is normal. No respiratory distress.     Breath sounds: Normal breath sounds.  Abdominal:     Palpations: Abdomen is soft.     Tenderness: There is no abdominal tenderness. There is no guarding or rebound.  Musculoskeletal:        General: No swelling.     Cervical back: Neck supple.  Skin:    General: Skin is warm and dry.     Capillary Refill: Capillary refill takes less than 2 seconds.  Neurological:     General: No focal deficit present.     Mental Status: He is alert.     Comments: Patient is able to answer simple questions and raise his arm up off the bed bilateral.  He can minimally raise his legs up off the bed.     ED Results / Procedures / Treatments   Labs (all labs ordered are listed, but only abnormal results are  displayed) Labs Reviewed  CBG MONITORING, ED    EKG None  Radiology No results found.  Procedures Procedures  {Document cardiac monitor, telemetry assessment procedure when appropriate:1}  Medications Ordered in ED Medications - No data to display  ED Course/ Medical Decision Making/ A&P   {   Click here for ABCD2, HEART and other calculatorsREFRESH Note before signing :1}                              Medical Decision Making Amount and/or Complexity of Data Reviewed Labs: ordered. Radiology: ordered.   This patient complains of ***; this involves an extensive number of treatment Options and is a complaint that carries with it a high risk of complications and morbidity. The differential includes ***  I ordered, reviewed and interpreted labs, which included *** I  ordered medication *** and reviewed PMP when indicated. I ordered imaging studies which included *** and I independently    visualized and interpreted imaging which showed *** Additional history obtained from *** Previous records obtained and reviewed *** I consulted *** and discussed lab and imaging findings and discussed disposition.  Cardiac monitoring reviewed, *** Social determinants considered, *** Critical Interventions: ***  After the interventions stated above, I reevaluated the patient and found *** Admission and further testing considered, ***   {Document critical care time when appropriate:1} {Document review of labs and clinical decision tools ie heart score, Chads2Vasc2 etc:1}  {Document your independent review of radiology images, and any outside records:1} {Document your discussion with family members, caretakers, and with consultants:1} {Document social determinants of health affecting pt's care:1} {Document your decision making why or why not admission, treatments were needed:1} Final Clinical Impression(s) / ED Diagnoses Final diagnoses:  None    Rx / DC Orders ED Discharge Orders     None

## 2024-02-01 DIAGNOSIS — F329 Major depressive disorder, single episode, unspecified: Secondary | ICD-10-CM

## 2024-02-01 LAB — URINALYSIS, ROUTINE W REFLEX MICROSCOPIC
Bilirubin Urine: NEGATIVE
Glucose, UA: NEGATIVE mg/dL
Ketones, ur: 20 mg/dL — AB
Leukocytes,Ua: NEGATIVE
Nitrite: NEGATIVE
Protein, ur: NEGATIVE mg/dL
Specific Gravity, Urine: 1.028 (ref 1.005–1.030)
pH: 5 (ref 5.0–8.0)

## 2024-02-01 LAB — RAPID URINE DRUG SCREEN, HOSP PERFORMED
Amphetamines: NOT DETECTED
Barbiturates: NOT DETECTED
Benzodiazepines: POSITIVE — AB
Cocaine: NOT DETECTED
Opiates: NOT DETECTED
Tetrahydrocannabinol: NOT DETECTED

## 2024-02-01 LAB — CK: Total CK: 250 U/L (ref 49–397)

## 2024-02-01 LAB — SARS CORONAVIRUS 2 BY RT PCR: SARS Coronavirus 2 by RT PCR: NEGATIVE

## 2024-02-01 MED ORDER — LORAZEPAM 1 MG PO TABS
1.0000 mg | ORAL_TABLET | ORAL | Status: DC | PRN
Start: 2024-02-01 — End: 2024-02-04
  Administered 2024-02-01 – 2024-02-03 (×4): 1 mg via ORAL
  Filled 2024-02-01 (×5): qty 1

## 2024-02-01 NOTE — Progress Notes (Signed)
 Patient has been denied by [facility] due to acuity. Patient meets BH inpatient criteria per Chandra Come, PMHNP. Patient has been faxed out to the following facilities:   [facility] [address] [place] [place] [address] [phone]  [facility] [address], [place] [address] [phone]  [facility] [place] [address] [place], [place] [address] [phone]  [facility] [address] [place] [address] [phone]  [facility] [address] [place] [address] [phone]  [facility] [address] [place] [address] [phone]  [facility] [address] [place] [address] [phone]  [facility] [address] [place] [phone]  [facility] [address] [place] [address] [phone]  [facility] [address] [place] [address] [phone]  [facility] [address] [place], [place] [address] [phone]  [facility] [address] [place] [address] [phone]  [facility] [address] [place] [address] [phone]  [facility] [address] [place] [address] [phone]  [facility] [address] [place] [address] [phone]  [facility] [address], [place] [address] [phone]    Phares Brasher, MSW, LCSW-A  9:22 PM 02/01/2024

## 2024-02-01 NOTE — ED Notes (Addendum)
 Pt's probation officer (Officer [place] [phone]) would like to be contacted before discharging patient as patient is homeless.

## 2024-02-01 NOTE — ED Notes (Signed)
 Patient oriented to TCU. Initially, patient was not responding verbally. After talking with patient and offering food and snack choices, patient began speaking minimally. Patient is currently eating a sandwich and drinking a soda/water.

## 2024-02-01 NOTE — ED Provider Notes (Signed)
 Emergency Medicine Observation Re-evaluation Note  Caleb Leblanc is a 63 y.o. male, seen on rounds today.  Pt initially presented to the ED for complaints of Seizures Currently, the patient is resting.  Physical Exam  BP (!) 153/76   Pulse 83   Temp 98.4 F (36.9 C) (Oral)   Resp 16   Ht 5\' 9"  (1.753 m)   Wt 126 kg   SpO2 96%   BMI 41.02 kg/m  Physical Exam General: NAD   ED Course / MDM  EKG:EKG Interpretation Date/Time:  Wednesday January 31 2024 17:29:25 EDT Ventricular Rate:  88 PR Interval:  147 QRS Duration:  97 QT Interval:  375 QTC Calculation: 454 R Axis:   -5  Text Interpretation: Sinus rhythm Abnormal R-wave progression, early transition Baseline wander in lead(s) V1 No significant change since last tracing Confirmed by Racheal Buddle [phone]) on 01/31/2024 6:10:38 PM  I have reviewed the labs performed to date as well as medications administered while in observation.  Recent changes in the last 24 hours include no acute events reported.  Plan  Current plan is for TTS evaluation.    Burnette Carte, MD 02/01/24 [phone]

## 2024-02-01 NOTE — ED Notes (Signed)
 Pt remains w/drawn. No holding a conversation with this RN. Was able to take PO medication and follow commands. Medication was placed in his mouth and pt took a few sips of water.

## 2024-02-01 NOTE — ED Notes (Signed)
 Pt is awake and alert. Pt will not speech or answer any questions. Pt is resting only opens eyes in response to sound or touch.

## 2024-02-01 NOTE — Consult Note (Signed)
 [facility] Health Psychiatric Consult Initial  Patient Name: .Caleb Leblanc  MRN: [id number]  DOB: 1961/03/31  Consult Order details:  Orders (From admission, onward)     Start     Ordered   01/31/24 2159  CONSULT TO CALL ACT TEAM       Ordering Provider: Tonya Fredrickson, MD  Provider:  (Not yet assigned)  Question:  Reason for Consult?  Answer:  Psych consult   01/31/24 2159             Mode of Visit: In person    Psychiatry Consult Evaluation  Service Date: February 01, 2024 LOS:  LOS: 0 days  Chief Complaint altered mental status  Primary Psychiatric Diagnoses  Major depressive disorder 2.   Anxiety 3.   Altered mental status  Assessment  Caleb Leblanc is a 63 y.o. male admitted: Presented to the ED on 01/31/2024  5:11 PM for altered mental status. He carries the psychiatric diagnoses of depression and has a past medical history of catatonia, OSA, constipation.   His current presentation of despondence, hopeless, suicidal thoughts, flat affect is most consistent with depression. He meets criteria for inpatient psychiatric admission based on current symptoms.  Current outpatient psychotropic medications include Zyprexa , and amitriptyline and historically he has had a positive response to these medications. He was non compliant with medications prior to admission as evidenced by patient report. On initial examination, patient is pleasant, but seems to have some cognitive impairment. Please see plan below for detailed recommendations.   Diagnoses:  Active Hospital problems: Principal Problem:   MDD (major depressive disorder) Active Problems:   Catatonia    Plan   # Altered mental status (unknown etiology) Vs. Catatonia The differential diagnosis for a nonresponsive patient with catatonic features needs to be considered.  These include encephalopathy, focal cerebral lesions, delirium, epilepsy, autoantibody encephalitis, serotonin syndrome, neuroleptic malignant  syndrome, acute psychosis and of course catatonia. So far, the workup has been negative although limited due to inability to get all the lab work done.  Recommend continuing follow-up.   ## Psychiatric Recommendations:  Consider using Ativan  PRN only. Will start 1mg  ativan  q6hr prn.    Consider an antipsychotic challenge given the premorbid presentation of thought blocking and confusion. Will start Zyprexa  5 mg qhs ( QtcB  545) monitor for EPS.    ## Medical Decision Making Capacity: Did not formally assessed.    ## Further Work-up:  Will obtain EKG repeat to ensure QTc is correcting.    Could consider the following studies --> Ammonia, UA, UDS, EEG, MRI, CK   ## Disposition:-- We recommend inpatient psychiatric hospitalization when medically cleared. Patient is under voluntary admission status at this time; please IVC if attempts to leave hospital.  ## Behavioral / Environmental: -Delirium Precautions: Delirium Interventions for Nursing and Staff: - RN to open blinds every AM. - To Bedside: Glasses, hearing aide, and pt's own shoes. Make available to patients. when possible and encourage use. - Encourage po fluids when appropriate, keep fluids within reach. - OOB to chair with meals. - Passive ROM exercises to all extremities with AM & PM care. - RN to assess orientation to person, time and place QAM and PRN. - Recommend extended visitation hours with familiar family/friends as feasible. - Staff to minimize disturbances at night. Turn off television when pt asleep or when not in use., To minimize splitting of staff, assign one staff person to communicate all information from the team when feasible., or Utilize  compassion and acknowledge the patient's experiences while setting clear and realistic expectations for care.    ## Safety and Observation Level:  - Based on my clinical evaluation, I estimate the patient to be at low risk of self harm in the current setting. - At this time, we  recommend  routine. This decision is based on my review of the chart including patient's history and current presentation, interview of the patient, mental status examination, and consideration of suicide risk including evaluating suicidal ideation, plan, intent, suicidal or self-harm behaviors, risk factors, and protective factors. This judgment is based on our ability to directly address suicide risk, implement suicide prevention strategies, and develop a safety plan while the patient is in the clinical setting. Please contact our team if there is a concern that risk level has changed.  CSSR Risk Category:C-SSRS RISK CATEGORY: No Risk  Suicide Risk Assessment: Patient has following modifiable risk factors for suicide: untreated depression, which we are addressing by recommending inpatient psychiatric admission. Patient has following non-modifiable or demographic risk factors for suicide: male gender and psychiatric hospitalization Patient has the following protective factors against suicide: Supportive friends  Thank you for this consult request. Recommendations have been communicated to the primary team.  We will recommend inpatient psychiatric admission at this time.   Chandra Come, PMHNP       History of Present Illness  Relevant Aspects of Hospital ED Course:  Admitted on 01/31/2024 for altered mental status.  Patient Report:  Caleb Leblanc, 63 y.o., male patient seen face to face by this provider, consulted with Dr. Deborah Falling; and chart reviewed on 02/01/24.  On evaluation Caleb Leblanc patient is laying on his gurney on his back, with his eyes closed.  Patient was able to wake up easily with verbal stimuli.  Patient's responses were very drawn out, the patient a long time to respond to questions that were asked by the provider.  Patient also spoke in a decreased tone.  Patient is alert and oriented to self and environment.  When this provider asked patient why he was here in the  emergency department, patient states "I do not remember a lot. "Patient states that he has been having a hard time with his memory, stating he does not know what is going on or what is happening.  Patient vaguely remembers being here in the emergency department 6 days ago for a medical admission due to his altered mental status.  Patient does appear to have some mild cognitive impairment, thought processes appear to be linear and organized, patient is a poor historian and states that he does not have any supportive family.  When asked this patient if he was having any suicidal ideations or thoughts, patient states "I am already dead "patient is unable to elaborate.  Patient also endorses visual hallucinations, stating "I see things at times "would not elaborate.  Patient denies HI/VH.  Patient does endorse some mild depression, stating he just recently got out of jail and is currently living in the [facility] house, when provider asked patient how he enjoyed to stay at the [facility] house, patient did not respond turned his head away from provider.  Patient is endorsing eating and sleeping well.  Patient's UDS on 01/29/24 and BAL are negative.   Psych ROS:  Depression: Positive Anxiety: Positive Mania (lifetime and current): Denies Psychosis: (lifetime and current): Denies  Collateral information:  Contacted none, patient was not given any consent  Review of Systems  Psychiatric/Behavioral:  Positive for  depression and memory loss.      Psychiatric and Social History  Psychiatric History:  Information collected from chart review and patient  Prev Dx/Sx: Depression and altered mental status Current Psych Provider: Denies Home Meds (current): Amitriptyline and Zyprexa  Previous Med Trials: Yes Therapy: None  Prior Psych Hospitalization: Yes Prior Self Harm: Denies Prior Violence: Denies  Family Psych History: Denies Family Hx suicide: Denies  Social History:  Developmental Hx:  Deferred Educational Hx: Unknown Occupational Hx: Unemployed Legal Hx: Denies Living Situation: Lives [facility] Spiritual Hx: Yes Access to weapons/lethal means: Denies  Substance History Patient denies any substance abuse use or alcohol  abuse  Exam Findings  Physical Exam:  Vital Signs:  Temp:  [98.4 F (36.9 C)-99 F (37.2 C)] 99 F (37.2 C) (04/24 1109) Pulse Rate:  [80-87] 87 (04/24 1109) Resp:  [16-23] 16 (04/24 1109) BP: (126-153)/(75-82) 126/75 (04/24 1109) SpO2:  [90 %-97 %] 97 % (04/24 1109) Blood pressure 126/75, pulse 87, temperature 99 F (37.2 C), temperature source Oral, resp. rate 16, height 5\' 9"  (1.753 m), weight 126 kg, SpO2 97%. Body mass index is 41.02 kg/m.  Physical Exam Neurological:     Mental Status: He is alert.    Vitals and nursing note reviewed.  Constitutional:      General: He is not in acute distress.    Appearance: He is obese. He is not ill-appearing, toxic-appearing or diaphoretic.     Comments: Oddly related and atypical interpersonal style   Mental Status Exam: General Appearance:  disheveled   Orientation:  Other: oriented to self and environment  Memory: Poor  Concentration:  Concentration: Poor and Attention Span: Poor  Recall:   Poor  Attention  Poor  Eye Contact:   Poor  Speech: normal  Language:   Variable  Volume:  decreased  Mood: sad  Affect: constricted  Thought Process: Short, reserved, vague, odd  Thought Content: Largely logical but atypical  Suicidal Thoughts:  No  Homicidal Thoughts:  No  Judgement:  Impaired  Insight:   Acutely poor  Psychomotor Activity:  Normal  Akathisia:  No  Fund of Knowledge:   Unable to assess       Assets:  Desire for Improvement Housing Resilience Social Support Transportation  Cognition:  Impaired,  Mild  ADL's:  Impaired; reported to not be able to attend to  AIMS (if indicated):        Other History   These have been pulled in through the EMR, reviewed, and  updated if appropriate.  Family History:  The patient's family history is not on file.  Medical History: Past Medical History:  Diagnosis Date  . Hypertension     Surgical History: No past surgical history on file.   Medications:   Current Facility-Administered Medications:  .  amLODipine  (NORVASC ) tablet 10 mg, 10 mg, Oral, Daily, Butler, Michael C, MD .  LORazepam  (ATIVAN ) tablet 1 mg, 1 mg, Oral, Q4H PRN, Motley-Mangrum, Larkin Alfred A, PMHNP, 1 mg at 02/01/24 1656 .  OLANZapine  (ZYPREXA ) tablet 5 mg, 5 mg, Oral, QHS, Butler, Michael C, MD  Current Outpatient Medications:  .  amLODipine  (NORVASC ) 10 MG tablet, Take 10 mg by mouth daily., Disp: , Rfl:  .  OLANZapine  (ZYPREXA ) 5 MG tablet, Take 1 tablet (5 mg total) by mouth at bedtime., Disp: 30 tablet, Rfl: 0 .  polyvinyl alcohol  (LIQUIFILM TEARS) 1.4 % ophthalmic solution, Place 1 drop into both eyes as needed for dry eyes., Disp: 15 mL, Rfl: 0  Allergies: Allergies  Allergen Reactions  . Amoxicillin Anaphylaxis  . Augmentin [Amoxicillin-Pot Clavulanate] Anaphylaxis    Johnathon Olden MOTLEY-MANGRUM, PMHNP

## 2024-02-01 NOTE — BH Assessment (Signed)
 Patient was deferred to [facility] for a telepsych assessment. The assigned care coordinator will provide updates regarding the scheduling of the assessment. [facility] care coordinator can be reached at [phone] for further information on the timing of the telepsych evaluation.

## 2024-02-02 NOTE — Evaluation (Signed)
 Physical Therapy Evaluation Patient Details Name: Caleb Leblanc MRN: [id number] DOB: Jan 07, 1961 Today's Date: 02/02/2024  History of Present Illness  JUANLUIS Leblanc is a 63 y.o. male presents on 01/31/24 brought in by EMS from side of road, possibly had some seizure-like activity. Of note, pt presented from [facility] house with reports of "unresponsiveness" on 4/21, but left AMA or eloped from ED. Patient recently admitted  from 01/18/24-01/26/24 with extensive workup for same; No etiology was found during workup; He was started on Zyprexa . PMH: OSA, HTN, morbid obesity, homelessness  Clinical Impression  Pt admitted with above diagnosis. Pt essentially nonverbal during eval, minimally nods head yes to mobilizing with therapist. Pt appears to be in pain at times with facial grimacing, though no specific pain reported. Pt needing mod A with bed mobility and transfers, able to perform static marching with RW, limited RLE weightbearing with increased BUE support on RW. Pt returns to supine in bed, left lights on and door open to increase stimulation in hopes to improve pt's alertness. Patient will benefit from continued inpatient follow up therapy, <3 hours/day. DME rec to follow pending home equipment and progress with therapy.  Pt currently with functional limitations due to the deficits listed below (see PT Problem List). Pt will benefit from acute skilled PT to increase their independence and safety with mobility to allow discharge.           If plan is discharge home, recommend the following: A lot of help with walking and/or transfers;A lot of help with bathing/dressing/bathroom;Assist for transportation;Assistance with cooking/housework   Can travel by private vehicle        Equipment Recommendations Other (comment) (TBD pending home equipment)  Recommendations for Other Services       Functional Status Assessment Patient has had a recent decline in their functional status and demonstrates  the ability to make significant improvements in function in a reasonable and predictable amount of time.     Precautions / Restrictions Precautions Precautions: Fall Recall of Precautions/Restrictions: Impaired Restrictions Weight Bearing Restrictions Per Provider Order: No      Mobility  Bed Mobility Overal bed mobility: Needs Assistance Bed Mobility: Supine to Sit, Sit to Supine     Supine to sit: Min assist, HOB elevated, Used rails Sit to supine: Min assist, Used rails   General bed mobility comments: min A to upright trunk into sitting, significantlty increased time and effort; min A to lift BLE back into bed and reposition to comfort, increased time and effort    Transfers Overall transfer level: Needs assistance Equipment used: Rolling walker (2 wheels) Transfers: Sit to/from Stand Sit to Stand: Mod assist           General transfer comment: mod A to power up from EOB, slow to power up, maintains trunk forward flexed    Ambulation/Gait Ambulation/Gait assistance: Min assist   Assistive device: Rolling walker (2 wheels)         General Gait Details: marching in place in front of bed, increased BUE on RW when standing on R SLS, unsteadiness noted, able to take 2 sidesteps up to [facility] prior to sitting, no steps away from bedside  Stairs            Wheelchair Mobility     Tilt Bed    Modified Rankin (Stroke Patients Only)       Balance Overall balance assessment: Needs assistance Sitting-balance support: Feet supported Sitting balance-Leahy Scale: Good     Standing balance  support: Reliant on assistive device for balance, During functional activity, Bilateral upper extremity supported Standing balance-Leahy Scale: Poor                               Pertinent Vitals/Pain Pain Assessment Pain Assessment: Faces Faces Pain Scale: Hurts a little bit Pain Location: generalized with mobility Pain Intervention(s): Limited activity  within patient's tolerance, Monitored during session    Home Living Family/patient expects to be discharged to:: Unsure                   Additional Comments: per chart review, pt amb 175 ft with RW and supv on 01/25/24 with therapy.    Prior Function Prior Level of Function : Patient poor historian/Family not available                     Extremity/Trunk Assessment   Upper Extremity Assessment Upper Extremity Assessment: Defer to OT evaluation    Lower Extremity Assessment Lower Extremity Assessment: Generalized weakness;Difficult to assess due to impaired cognition    Cervical / Trunk Assessment Cervical / Trunk Assessment: Kyphotic  Communication   Communication Factors Affecting Communication: Other (comment) (nonverbal except for 1 sentence)    Cognition Arousal: Lethargic Behavior During Therapy: Flat affect   PT - Cognitive impairments: Difficult to assess Difficult to assess due to: Level of arousal                     PT - Cognition Comments: pt minimally nods head "yes" to engaging in therapy, only states "let's give it a go" when therapist inquired about standing and taking steps, otherwise pt doesn't state words   Following commands impaired: Follows one step commands with increased time     Cueing Cueing Techniques: Verbal cues, Tactile cues     General Comments      Exercises     Assessment/Plan    PT Assessment Patient needs continued PT services  PT Problem List Decreased strength;Decreased activity tolerance;Decreased balance;Decreased mobility;Decreased cognition;Decreased knowledge of use of DME;Obesity       PT Treatment Interventions DME instruction;Gait training;Functional mobility training;Therapeutic activities;Therapeutic exercise;Balance training;Cognitive remediation;Patient/family education    PT Goals (Current goals can be found in the Care Plan section)  Acute Rehab PT Goals Patient Stated Goal: none  stated, agreeable to therapy PT Goal Formulation: With patient Time For Goal Achievement: 02/16/24 Potential to Achieve Goals: Fair    Frequency Min 2X/week     Co-evaluation               AM-PAC PT "6 Clicks" Mobility  Outcome Measure Help needed turning from your back to your side while in a flat bed without using bedrails?: A Little Help needed moving from lying on your back to sitting on the side of a flat bed without using bedrails?: A Little Help needed moving to and from a bed to a chair (including a wheelchair)?: A Lot Help needed standing up from a chair using your arms (e.g., wheelchair or bedside chair)?: A Lot Help needed to walk in hospital room?: A Lot Help needed climbing 3-5 steps with a railing? : Total 6 Click Score: 13    End of Session Equipment Utilized During Treatment: Gait belt Activity Tolerance: Patient limited by lethargy Patient left: in bed;with call bell/phone within reach;with bed alarm set Nurse Communication: Mobility status PT Visit Diagnosis: Other abnormalities of gait and mobility (  R26.89);Muscle weakness (generalized) (M62.81);Unsteadiness on feet (R26.81)    Time: 2841-3244 PT Time Calculation (min) (ACUTE ONLY): 19 min   Charges:   PT Evaluation $PT Eval Moderate Complexity: 1 Mod   PT General Charges $$ ACUTE PT VISIT: 1 Visit         Tori Desmond Tufano PT, DPT 02/02/24, 1:43 PM

## 2024-02-02 NOTE — ED Notes (Signed)
 Patient assisted in sitting up on side of bed to eat breakfast. Patient talking to pharmacy tech at bedside.

## 2024-02-02 NOTE — ED Notes (Signed)
 [facility] called pts emergency contact for collateral information. The phone number in the chart was not in service.   [facility] called pts PO ([employer]er [place]) to update them on pt and obtain collateral information. [facility] left a HIPAA compliant message to return the call.   [facility] called [facility] to inquire if pt is eligible to return to the residence. [facility] left a HIPAA complaint message to return the call.   Patt Boozer, [facility]  02/02/24

## 2024-02-02 NOTE — ED Provider Notes (Signed)
 Emergency Medicine Observation Re-evaluation Note  Caleb Leblanc is a 63 y.o. male, h/o HTN, OSA, morbid obesity, homelessness, who was seen on rounds today.  Pt initially presented to the ED for complaints of Seizures  And catatonia, MDD. Currently, the patient is resting.  Physical Exam  BP 125/79 (BP Location: Right Arm)   Pulse 80   Temp 98.8 F (37.1 C) (Axillary)   Resp 16   Ht 5\' 9"  (1.753 m)   Wt 126 kg   SpO2 96%   BMI 41.02 kg/m  Physical Exam General: NAD Cardiac: well-perfused Lungs: No resp distress Psych: resting  ED Course / MDM  EKG:EKG Interpretation Date/Time:  Wednesday January 31 2024 17:29:25 EDT Ventricular Rate:  88 PR Interval:  147 QRS Duration:  97 QT Interval:  375 QTC Calculation: 454 R Axis:   -5  Text Interpretation: Sinus rhythm Abnormal R-wave progression, early transition Baseline wander in lead(s) V1 No significant change since last tracing Confirmed by Racheal Buddle [phone]) on 01/31/2024 6:10:38 PM  I have reviewed the labs performed to date as well as medications administered while in observation.  Recent changes in the last 24 hours include evaluated by [facility] and meets [facility] inpatient crtieria.   Plan  Current plan is for inpatient psych placement.      Merdis Stalling, MD 02/02/24 [phone]

## 2024-02-02 NOTE — ED Notes (Signed)
 [facility] received a call from [facility] [phone]) to obtain collateral information and provide an update on pt. Per [employer], pt was living and working at [employer] after his release from prison on April 4th, 2025. Pt was serving a sentence for arson. Pt was discharged from [facility] due to high medical needs and is not eligible to return which pt is aware of. Officer Vergia Glasgow has been looking for a new placement for pt but reports that there are no available beds in [facility] at this time. Without bed availability, pt will need to have an ankle monitor in order to track his location.   Per [employer], who has known pt only since he was released from prison, pts behavior has been similar to what pt presents in the ED. Pt has been selective with his responses, sometimes speaking and sometimes not. Officer Bolton reports that pt "perked up" when he found out without compliance he might return to prison. [facility] informed Officer Bolton that pt has been recommended inpatient but has not been picked up so far due to medical issues. [facility] agreed to keep [employer] informed of pts disposition.   Patt Boozer, [facility]  02/02/24

## 2024-02-02 NOTE — ED Notes (Addendum)
 Pt continues to rest in bed. Opens eyes to voice. Remains nonverbal. Pt reposition with pillow as he is leaning left. Was able to follow instruction and lift his own head. Call bell and water within reach. Lights off for comfort

## 2024-02-02 NOTE — ED Notes (Signed)
 Felipa Horsfall with [facility] called to inquire about patient status and HX.

## 2024-02-02 NOTE — ED Notes (Addendum)
 Pt remains sleeping. As slept throughout this shift. Has used condom cath to void. Cath drainage bag emptied with 80 ml output this morning. Call bell remains at bedside. Sitter with pt.

## 2024-02-02 NOTE — ED Notes (Signed)
 [facility]/Unit Secretary Buelah Carmel called requesting further information on pt for placement review. Requested psychiatric assessment and ED physician assessment record. Records printed and faxed with assistance of [facility] secretary Raoul Byes to requested fax [phone].

## 2024-02-02 NOTE — ED Notes (Signed)
 Pt continues to refuse PO fluids.

## 2024-02-02 NOTE — Progress Notes (Signed)
 Pt was re-faxed out to the following facilities:  Destination  Service Provider Address Phone [facility] [address] [place] [place] [address] [phone]  [facility] [address] [place], [place] [address] [phone]  [facility] [address] [place] [address] [phone]  [facility] [address] Johnella Naas [place] [address] [phone]  [facility] [address] [place] [address] [phone]  [facility] [address] [place] [address] [phone]  [facility] EFAX [address], [place] [phone]  [facility] [address] [place] [address] [phone]  [facility] [address] [place], [place] [address] [phone]  [facility] [address] [place] [address] [phone]  [facility] [address], [place] [address] [phone]  [facility] [address] [place] [address] [phone]  [facility] [address] [place] [address] [phone]  [facility] [address] [place] [address] [phone]    Macky Sayres [phone]

## 2024-02-02 NOTE — ED Notes (Signed)
 This nurse received pt resting in bed. This nurse attempted to communicate with pt by asking pt if he has any pain. Pt did not verbally respond but did make eye contact with nurse/looked in nurse direction be still remained silent. Pt continues to rest in bed. Natural raise and fall of check noted. Bed at lowest setting and call bed in reach.

## 2024-02-02 NOTE — Progress Notes (Addendum)
 Pt denied acceptance at  [facility] because of urinary incontinence and high acuity. Pt denied at [facility] because of seizure disorder.  Pt will be re-faxed out.  Macky Sayres, [facility]

## 2024-02-03 DIAGNOSIS — F329 Major depressive disorder, single episode, unspecified: Secondary | ICD-10-CM | POA: Diagnosis not present

## 2024-02-03 MED ORDER — ESCITALOPRAM OXALATE 10 MG PO TABS
5.0000 mg | ORAL_TABLET | Freq: Every day | ORAL | Status: DC
  Administered 2024-02-03 – 2024-02-04 (×2): 5 mg via ORAL
  Filled 2024-02-03 (×2): qty 1

## 2024-02-03 MED ORDER — OLANZAPINE 5 MG PO TBDP: 5.0000 mg | ORAL_TABLET | Freq: Two times a day (BID) | ORAL | Status: DC

## 2024-02-03 MED ORDER — OLANZAPINE 5 MG PO TBDP
2.5000 mg | ORAL_TABLET | Freq: Two times a day (BID) | ORAL | Status: DC
  Administered 2024-02-03 – 2024-02-04 (×3): 2.5 mg via ORAL
  Filled 2024-02-03 (×3): qty 1

## 2024-02-03 NOTE — Consult Note (Signed)
 [facility] Psychiatric Consult Initial  Patient Name: .Caleb Leblanc  MRN: [id number]  DOB: 1961/09/17  Consult Order details:  Orders (From admission, onward)     Start     Ordered   01/31/24 2159  CONSULT TO CALL ACT TEAM       Ordering Provider: Tonya Fredrickson, MD  Provider:  (Not yet assigned)  Question:  Reason for Consult?  Answer:  Psych consult   01/31/24 2159             Mode of Visit: In person    Psychiatry Consult Evaluation  Service Date: February 03, 2024 LOS:  LOS: 0 days  Chief Complaint altered mental status  Primary Psychiatric Diagnoses  Major depressive disorder 2.   Anxiety 3.   Altered mental status  Assessment  Caleb Leblanc is a 63 y.o. male admitted: Presented to the ED on 01/31/2024  5:11 PM for altered mental status. He carries the psychiatric diagnoses of depression and has a past medical history of catatonia, OSA, constipation.   His current presentation of despondence, hopeless, suicidal thoughts, flat affect is most consistent with depression. He meets criteria for inpatient psychiatric admission based on current symptoms.  Current outpatient psychotropic medications include Zyprexa , and amitriptyline and historically he has had a positive response to these medications. He was non compliant with medications prior to admission as evidenced by patient report. On initial examination, patient is pleasant, but seems to have some cognitive impairment. Please see plan below for detailed recommendations.   Diagnoses:  Active Hospital problems: Principal Problem:   MDD (major depressive disorder) Active Problems:   Catatonia    Plan   ## Psychiatric Recommendations:  Continue Ativan  PRN only. Will start 1mg  ativan  q6hr prn.  Start Zyprexa  2.5 mg PO BID for mood Start lexapro 5 mg PO for depression    ## Medical Decision Making Capacity: Patient is his own legal guardian   ## Further Work-up:  EKG QTc  454 on 02/02/24   Could consider  the following studies --> Ammonia, UA, UDS, EEG, MRI, CK   ## Disposition:-- We recommend inpatient psychiatric hospitalization when medically cleared. Patient is under voluntary admission status at this time; please IVC if attempts to leave hospital.  ## Behavioral / Environmental: -Delirium Precautions: Delirium Interventions for Nursing and Staff: - RN to open blinds every AM. - To Bedside: Glasses, hearing aide, and pt's own shoes. Make available to patients. when possible and encourage use. - Encourage po fluids when appropriate, keep fluids within reach. - OOB to chair with meals. - Passive ROM exercises to all extremities with AM & PM care. - RN to assess orientation to person, time and place QAM and PRN. - Recommend extended visitation hours with familiar family/friends as feasible. - Staff to minimize disturbances at night. Turn off television when pt asleep or when not in use., To minimize splitting of staff, assign one staff person to communicate all information from the team when feasible., or Utilize compassion and acknowledge the patient's experiences while setting clear and realistic expectations for care.    ## Safety and Observation Level:  - Based on my clinical evaluation, I estimate the patient to be at low risk of self harm in the current setting. - At this time, we recommend  routine. This decision is based on my review of the chart including patient's history and current presentation, interview of the patient, mental status examination, and consideration of suicide risk including evaluating suicidal ideation, plan,  intent, suicidal or self-harm behaviors, risk factors, and protective factors. This judgment is based on our ability to directly address suicide risk, implement suicide prevention strategies, and develop a safety plan while the patient is in the clinical setting. Please contact our team if there is a concern that risk level has changed.  CSSR Risk Category:C-SSRS RISK  CATEGORY: No Risk  Suicide Risk Assessment: Patient has following modifiable risk factors for suicide: untreated depression, which we are addressing by recommending inpatient psychiatric admission. Patient has following non-modifiable or demographic risk factors for suicide: male gender and psychiatric hospitalization Patient has the following protective factors against suicide: Supportive friends  Thank you for this consult request. Recommendations have been communicated to the primary team.  We will recommend inpatient psychiatric admission at this time.   Caleb Leblanc, PMHNP       History of Present Illness  Relevant Aspects of Hospital ED Course:  Admitted on 01/31/2024 for altered mental status.   Patient Report:  On evaluation today, this provider observed patient walking back from the bed, from getting out of the shower.  During the assessment patient is calm and cooperative, stating that he feels depressed, and is not able to contract for safety, he states that he is able to ambulate without assistance, states that he is not incontinent and is able to do his own activities of daily living. His appearance is appropriate for environment.  Patient has minimal eye contact.  Speech is clear and coherent, normal pace and decreased volume. He is alert and oriented x4 to person, place, time, and situation.  He reports his mood as "sad.  ".  Affect is congruent with mood.  Thought process is coherent.  He denies auditory and visual hallucinations.  No indication that he is responding to internal stimuli during this assessment.  No delusions elicited during this assessment. She denies homicidal ideations. Appetite and sleep are fair.  Patient continues to endorse suicidal ideations, stating with no plan, and is unable to contract for safety, he states that he feels it is not fair that he is not able to return to the [facility] because of his medical conditions.  Patient is aware that  staff members have problems with his parole officer, and states he feels sad and lonely because he has no one and no where to go.     Psych ROS:  Depression: Positive Anxiety: Positive Mania (lifetime and current): Denies Psychosis: (lifetime and current): Denies  Collateral information:  Contacted none, patient was not given any consent  Review of Systems  Psychiatric/Behavioral:  Positive for depression and memory loss.      Psychiatric and Social History  Psychiatric History:  Information collected from chart review and patient  Prev Dx/Sx: Depression and altered mental status Current Psych Provider: Denies Home Meds (current): Amitriptyline and Zyprexa  Previous Med Trials: Yes Therapy: None  Prior Psych Hospitalization: Yes Prior Self Harm: Denies Prior Violence: Denies  Family Psych History: Denies Family Hx suicide: Denies  Social History:  Developmental Hx: Deferred Educational Hx: Unknown Occupational Hx: Unemployed Legal Hx: Denies Living Situation: Lives [facility] Spiritual Hx: Yes Access to weapons/lethal means: Denies  Substance History Patient denies any substance abuse use or alcohol  abuse  Exam Findings  Physical Exam:  Vital Signs:  Temp:  [97.8 F (36.6 C)-98.6 F (37 C)] 97.8 F (36.6 C) (04/26 1323) Pulse Rate:  [83-86] 86 (04/26 1323) Resp:  [16-20] 18 (04/26 1323) BP: (133-143)/(85-88) 143/88 (04/26 1323) SpO2:  [93 %-  100 %] 99 % (04/26 1323) Blood pressure (!) 143/88, pulse 86, temperature 97.8 F (36.6 C), temperature source Oral, resp. rate 18, height 5\' 9"  (1.753 m), weight 126 kg, SpO2 99%. Body mass index is 41.02 kg/m.  Physical Exam Neurological:     Mental Status: He is alert.    Vitals and nursing note reviewed.  Constitutional:      General: He is not in acute distress.    Appearance: He is obese. He is not ill-appearing, toxic-appearing or diaphoretic.     Comments: Oddly related and atypical interpersonal  style   Mental Status Exam: General Appearance:  disheveled   Orientation:  Other: oriented to self and environment  Memory: Fair  Concentration:  Concentration: Poor and Attention Span: Poor  Recall:  Fair  Attention  Fair  Eye Contact:   Fair  Speech: normal  Language:   Variable  Volume:  decreased  Mood: sad  Affect: constricted  Thought Process: Short, reserved, vague, odd  Thought Content: Largely logical but atypical  Suicidal Thoughts:  No  Homicidal Thoughts:  No  Judgement:  Impaired  Insight:   Acutely poor  Psychomotor Activity:  Normal  Akathisia:  No  Fund of Knowledge:   Unable to assess       Assets:  Desire for Improvement Housing Resilience Social Support Transportation  Cognition:  Impaired,  Mild  ADL's:  Impaired; reported to not be able to attend to  AIMS (if indicated):        Other History   These have been pulled in through the EMR, reviewed, and updated if appropriate.  Family History:  The patient's family history is not on file.  Medical History: Past Medical History:  Diagnosis Date   Hypertension     Surgical History: No past surgical history on file.   Medications:   Current Facility-Administered Medications:    amLODipine  (NORVASC ) tablet 10 mg, 10 mg, Oral, Daily, Butler, Michael C, MD, 10 mg at 02/03/24 1119   LORazepam  (ATIVAN ) tablet 1 mg, 1 mg, Oral, Q4H PRN, Motley-Mangrum, Zipporah Finamore A, PMHNP, 1 mg at 02/03/24 0604  Current Outpatient Medications:    amLODipine  (NORVASC ) 10 MG tablet, Take 10 mg by mouth daily. (Patient not taking: Reported on 02/02/2024), Disp: , Rfl:    OLANZapine  (ZYPREXA ) 5 MG tablet, Take 1 tablet (5 mg total) by mouth at bedtime. (Patient not taking: Reported on 02/02/2024), Disp: 30 tablet, Rfl: 0  Allergies: Allergies  Allergen Reactions   Augmentin [Amoxicillin-Pot Clavulanate] Anaphylaxis    Mikeila Burgen MOTLEY-MANGRUM, PMHNP

## 2024-02-03 NOTE — ED Notes (Signed)
 Patient ambulated to bathroom to shower. Patient noted to have on condom cath. Patient has feces scattered on buttocks and back of legs. Patient was wiped down by staff. Patient removed clothing independently. Patient asked if toilet worked, patient sat on toilet and had large bowel movement. Condom catheter removed and was noted to have feces on tubing.

## 2024-02-03 NOTE — ED Provider Notes (Signed)
 Emergency Medicine Observation Re-evaluation Note  Caleb Leblanc is a 63 y.o. male, seen on rounds today.  Pt initially presented to the ED for complaints of Seizures Currently, the patient is sleeping.  Physical Exam  BP 133/88   Pulse 85   Temp 98 F (36.7 C)   Resp 20   Ht 5\' 9"  (1.753 m)   Wt 126 kg   SpO2 100%   BMI 41.02 kg/m  Physical Exam General: NAD Cardiac: Regular HR Lungs: No respiratory distress   ED Course / MDM  EKG:EKG Interpretation Date/Time:  Thursday February 01 2024 19:23:20 EDT Ventricular Rate:  79 PR Interval:  142 QRS Duration:  100 QT Interval:  374 QTC Calculation: 428 R Axis:   9  Text Interpretation: Normal sinus rhythm Normal ECG When compared with ECG of 31-Jan-2024 17:29, No significant change was found Confirmed by Alissa April ([id number]) on 02/03/2024 12:37:52 AM  I have reviewed the labs performed to date as well as medications administered while in observation.    Plan  Current plan is for psychiatric placement    Christe Tellez, Janalyn Me, MD 02/03/24 [phone]

## 2024-02-03 NOTE — ED Notes (Signed)
 Patient took medication without difficulty. Patient requested bed to be reclined so he can take a nap. Patient states he is not hungry when asked about finishing lunch tray. Patient reminded bathroom is available in room and should use this instead of soiling self. Patient verbalized understanding. Writer offered patient bathroom at this time; patient declined need. Patient provided with warm blanket and lights dimmed so patient can rest. Patient is more cooperative with staff requests and interactive with staff at this time.

## 2024-02-03 NOTE — ED Notes (Signed)
 Patient sleeping comfortably. Chest rise and fall noted. Patient did not have sitter at this time

## 2024-02-03 NOTE — ED Notes (Signed)
 Patient sleeping first part of this shift. Patient did respond to take medications and took without difficulty. Psych NP attempted to speak with patient at this time, with this writer present. Patient would not respond to questions from provider. When provider exited room, patient spoke to Clinical research associate. Patient states he is from [place]  and has no family around. Patient states he does not know how long he has had catheter in but says he is unable to urinate without it. Patient declined food tray. Writer attempted to have patient get out of bed and ambulate to have shower. Patient says he will do this in a little while. Patient reports no pain at this time.

## 2024-02-03 NOTE — ED Notes (Signed)
 Patient on catatonic state this morning. Ativan  given. Able to drink p.o. ativan  and sips of water. Will continue to monitor.

## 2024-02-04 ENCOUNTER — Other Ambulatory Visit: Payer: Self-pay

## 2024-02-04 ENCOUNTER — Encounter: Payer: Self-pay | Admitting: Psychiatry

## 2024-02-04 ENCOUNTER — Inpatient Hospital Stay
Admission: AD | Admit: 2024-02-04 | Discharge: 2024-02-23 | DRG: 885 | Disposition: A | Discharge status: Home / Self Care | Payer: 59 | Attending: Psychiatry | Admitting: Psychiatry

## 2024-02-04 DIAGNOSIS — Z604 Social exclusion and rejection: Secondary | ICD-10-CM | POA: Diagnosis present

## 2024-02-04 DIAGNOSIS — Z5941 Food insecurity: Secondary | ICD-10-CM | POA: Diagnosis not present

## 2024-02-04 DIAGNOSIS — F331 Major depressive disorder, recurrent, moderate: Secondary | ICD-10-CM

## 2024-02-04 DIAGNOSIS — F333 Major depressive disorder, recurrent, severe with psychotic symptoms: Secondary | ICD-10-CM | POA: Diagnosis not present

## 2024-02-04 DIAGNOSIS — R7303 Prediabetes: Secondary | ICD-10-CM | POA: Diagnosis present

## 2024-02-04 DIAGNOSIS — Z23 Encounter for immunization: Secondary | ICD-10-CM | POA: Diagnosis not present

## 2024-02-04 DIAGNOSIS — R45851 Suicidal ideations: Secondary | ICD-10-CM | POA: Diagnosis present

## 2024-02-04 DIAGNOSIS — F419 Anxiety disorder, unspecified: Secondary | ICD-10-CM | POA: Diagnosis present

## 2024-02-04 DIAGNOSIS — I10 Essential (primary) hypertension: Secondary | ICD-10-CM | POA: Diagnosis present

## 2024-02-04 DIAGNOSIS — Z5901 Sheltered homelessness: Secondary | ICD-10-CM

## 2024-02-04 DIAGNOSIS — Z560 Unemployment, unspecified: Secondary | ICD-10-CM

## 2024-02-04 DIAGNOSIS — F329 Major depressive disorder, single episode, unspecified: Principal | ICD-10-CM | POA: Diagnosis present

## 2024-02-04 DIAGNOSIS — F323 Major depressive disorder, single episode, severe with psychotic features: Secondary | ICD-10-CM | POA: Diagnosis present

## 2024-02-04 DIAGNOSIS — Z87891 Personal history of nicotine dependence: Secondary | ICD-10-CM | POA: Diagnosis not present

## 2024-02-04 DIAGNOSIS — Z5982 Transportation insecurity: Secondary | ICD-10-CM | POA: Diagnosis not present

## 2024-02-04 MED ORDER — ESCITALOPRAM OXALATE 10 MG PO TABS
5.0000 mg | ORAL_TABLET | Freq: Every day | ORAL | Status: DC
  Administered 2024-02-05: 5 mg via ORAL
  Filled 2024-02-04: qty 1

## 2024-02-04 MED ORDER — AMLODIPINE BESYLATE 5 MG PO TABS
10.0000 mg | ORAL_TABLET | Freq: Every day | ORAL | Status: DC
  Administered 2024-02-05 – 2024-02-23 (×17): 10 mg via ORAL
  Filled 2024-02-04 (×19): qty 2

## 2024-02-04 MED ORDER — LORAZEPAM 1 MG PO TABS: 1.0000 mg | ORAL_TABLET | ORAL | Status: DC | PRN

## 2024-02-04 MED ORDER — HALOPERIDOL 5 MG PO TABS: 5.0000 mg | ORAL_TABLET | Freq: Three times a day (TID) | ORAL | Status: DC | PRN

## 2024-02-04 MED ORDER — HYDROXYZINE HCL 25 MG PO TABS: 25.0000 mg | ORAL_TABLET | Freq: Three times a day (TID) | ORAL | Status: DC | PRN

## 2024-02-04 MED ORDER — ACETAMINOPHEN 325 MG PO TABS: 650.0000 mg | ORAL_TABLET | Freq: Four times a day (QID) | ORAL | Status: DC | PRN

## 2024-02-04 MED ORDER — DIPHENHYDRAMINE HCL 50 MG/ML IJ SOLN: 50.0000 mg | Freq: Three times a day (TID) | INTRAMUSCULAR | Status: DC | PRN

## 2024-02-04 MED ORDER — MAGNESIUM HYDROXIDE 400 MG/5ML PO SUSP
30.0000 mL | Freq: Every day | ORAL | Status: DC | PRN
  Filled 2024-02-04: qty 30

## 2024-02-04 MED ORDER — HALOPERIDOL LACTATE 5 MG/ML IJ SOLN: 5.0000 mg | Freq: Three times a day (TID) | INTRAMUSCULAR | Status: DC | PRN

## 2024-02-04 MED ORDER — TRAZODONE HCL 50 MG PO TABS
50.0000 mg | ORAL_TABLET | Freq: Every evening | ORAL | Status: DC | PRN
  Administered 2024-02-04 – 2024-02-22 (×18): 50 mg via ORAL
  Filled 2024-02-04 (×18): qty 1

## 2024-02-04 MED ORDER — DIPHENHYDRAMINE HCL 25 MG PO CAPS: 50.0000 mg | ORAL_CAPSULE | Freq: Three times a day (TID) | ORAL | Status: DC | PRN

## 2024-02-04 MED ORDER — INFLUENZA VIRUS VACC SPLIT PF (FLUZONE) 0.5 ML IM SUSY
0.5000 mL | PREFILLED_SYRINGE | INTRAMUSCULAR | Status: DC
  Filled 2024-02-04: qty 0.5

## 2024-02-04 MED ORDER — HALOPERIDOL LACTATE 5 MG/ML IJ SOLN: 10.0000 mg | Freq: Three times a day (TID) | INTRAMUSCULAR | Status: DC | PRN

## 2024-02-04 MED ORDER — LORAZEPAM 2 MG/ML IJ SOLN: 2.0000 mg | Freq: Three times a day (TID) | INTRAMUSCULAR | Status: DC | PRN

## 2024-02-04 MED ORDER — ALUM & MAG HYDROXIDE-SIMETH 200-200-20 MG/5ML PO SUSP: 30.0000 mL | ORAL | Status: DC | PRN

## 2024-02-04 MED ORDER — OLANZAPINE 5 MG PO TBDP
2.5000 mg | ORAL_TABLET | Freq: Two times a day (BID) | ORAL | Status: DC
  Administered 2024-02-04 – 2024-02-06 (×5): 2.5 mg via ORAL
  Filled 2024-02-04: qty 1
  Filled 2024-02-04: qty 0.5
  Filled 2024-02-04 (×5): qty 1

## 2024-02-04 NOTE — Plan of Care (Signed)

## 2024-02-04 NOTE — BHH Counselor (Signed)
 Adult Comprehensive Assessment  Patient ID: DAMON GERSH, male   DOB: 26-Jul-1961, 63 y.o.   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 Physical health (include injuries & life threatening diseases): "high blood pressure" Social relationships: none reported Substance abuse: "I can remember" Bereavement / Loss: none reported  Living/Environment/Situation:  Living Arrangements: Alone (homneless) Living conditions (as described by patient or guardian): homeless How long has patient lived in current situation?: "years"  Family History:     Childhood History:     Education:  Currently a Consulting civil engineer?: No  Employment/Work Situation:   Employment Situation: Unemployed Patient's Job has Been Impacted by Current Illness: No  Financial Resources:   Surveyor, quantity resources: No income Does patient have a Lawyer or guardian?: No  Alcohol /Substance Abuse:   What has been your use of drugs/alcohol  within the last 12 months?: Pt states he is can't remember If attempted suicide, did drugs/alcohol  play a role in this?: No  Social Support System:   Forensic psychologist System: None  Leisure/Recreation:      Strengths/Needs:      Discharge Plan:   Currently receiving community mental health services: No Does patient have access to transportation?: No Does patient have financial barriers related to discharge medications?: Yes Will patient be returning to same living situation after discharge?:  Yes  Summary/Recommendations:   Summary and Recommendations (to be completed by the evaluator): Destan is a 63 y/o male admitted on 02/04/2024 for altered mental statud and diagnosed with Major Depression Disorder. Patient states that he is homeless and does not have any income. Patient is unable to recall alot of the assessment questions but is wanting to have help to feel better. Patient nodded that he would like to see a psychiatrist for medication management. Patient has multiple barriers such as homelessness, no income, uninsured, and possible cognitive impairment  as reported in previous progress notes. Patient will benefit from crisis stabilization, medication evaluation, group therapy and psychoeducation, in addition to case management for discharge planning. At discharge it is recommended that Patient adhere to the established discharge plan and continue in treatment.  Finis Hugger. 02/04/2024

## 2024-02-04 NOTE — Tx Team (Signed)
 Initial Treatment Plan 02/04/2024 1:16 PM Caleb Leblanc ZOX:[id number]    PATIENT STRESSORS: Other: "Life in general." Patient was unable/refused to go into further detail.      PATIENT STRENGTHS: Other: Patient declines   PATIENT IDENTIFIED PROBLEMS: Financial instability - lack of food, housing, transportation                     DISCHARGE CRITERIA:  Ability to meet basic life and health needs Adequate post-discharge living arrangements Improved stabilization in mood, thinking, and/or behavior Motivation to continue treatment in a less acute level of care Verbal commitment to aftercare and medication compliance  PRELIMINARY DISCHARGE PLAN: Placement in alternative living arrangements  PATIENT/FAMILY INVOLVEMENT: This treatment plan has been presented to and reviewed with the patient, Caleb Leblanc. The patient and has been given the opportunity to ask questions and make suggestions.  Alwin Joy, RN 02/04/2024, 1:16 PM

## 2024-02-04 NOTE — ED Provider Notes (Signed)
 Emergency Medicine Observation Re-evaluation Note  Caleb Leblanc is a 63 y.o. male, seen on rounds today.  Pt initially presented to the ED for complaints of Seizures Currently, the patient is waiting for transport.  Physical Exam  BP (!) 151/87 (BP Location: Right Arm)   Pulse 78   Temp 98.6 F (37 C) (Oral)   Resp 16   Ht 5\' 9"  (1.753 m)   Wt 126 kg   SpO2 96%   BMI 41.02 kg/m  Physical Exam General: No acute distress Cardiac: regular Lungs: clear Psych: cooperative  ED Course / MDM  EKG:EKG Interpretation Date/Time:  Thursday February 01 2024 19:23:20 EDT Ventricular Rate:  79 PR Interval:  142 QRS Duration:  100 QT Interval:  374 QTC Calculation: 428 R Axis:   9  Text Interpretation: Normal sinus rhythm Normal ECG When compared with ECG of 31-Jan-2024 17:29, No significant change was found Confirmed by Alissa April ([id number]) on 02/03/2024 12:37:52 AM  I have reviewed the labs performed to date as well as medications administered while in observation.  Recent changes in the last 24 hours include none.  Plan  Current plan is for inpt psych.    Almond Army, MD 02/04/24 [phone]

## 2024-02-04 NOTE — ED Notes (Signed)
 Safe Transport en route for pt.

## 2024-02-04 NOTE — ED Notes (Signed)
 Report called to Aimee at [facility].

## 2024-02-04 NOTE — Progress Notes (Addendum)
 Patient arrives voluntary to the unit. He is pleasant and cooperative. Patient and belongings searched with no contraband found. Skin assessment completed with Amy, RN. Patient endorses passive SI related to "life in general." He denies any new changes or concerns. Required admission forms signed. Patient oriented to the unit and all questions answered.    02/04/24 1300  Psych Admission Type (Psych Patients Only)  Admission Status Voluntary  Psychosocial Assessment  Patient Complaints Hopelessness;Self-harm thoughts;Sadness  Eye Contact Brief  Facial Expression Flat  Affect Sad  Speech Soft;Slow;Slurred  Interaction Minimal  Motor Activity Slow  Appearance/Hygiene Body odor;In scrubs;Poor hygiene  Behavior Characteristics Cooperative  Mood Sad;Pleasant;Depressed  Thought Process  Coherency WDL  Content WDL  Delusions None reported or observed  Perception WDL  Hallucination None reported or observed (Patient reports sometimes having AVH; denies currently)  Judgment Impaired  Confusion Mild  Danger to Self  Current suicidal ideation? Passive  Self-Injurious Behavior No self-injurious ideation or behavior indicators observed or expressed   Agreement Not to Harm Self Yes  Description of Agreement Verbal  Danger to Others  Danger to Others None reported or observed

## 2024-02-04 NOTE — H&P (Signed)
 Psychiatric Admission Assessment Adult  Patient Identification: Caleb Leblanc MRN:  [id number] Date of Evaluation:  02/04/2024 Chief Complaint:  MDD (major depressive disorder) [F32.9]   History of Present Illness:  Caleb Leblanc is a 63 y.o. male admitted: Presented to the ED on 01/31/2024  5:11 PM for altered mental status. He carries the psychiatric diagnoses of depression and has a past medical history of catatonia, OSA, constipation. His current presentation of despondence, hopeless, suicidal thoughts, flat affect is most consistent with depression.   On interview patient is a poor historian, for most of the questions he reports "I do not know ".  Per nursing report patient is noted to be verbally minimally raised and sleepy.  With the provider patient is alert and engaged with few responses.  He reports that he passes out during steady feeling shaky and reports that he was brought to the emergency room.  Depression he reports he did acknowledge feeling hopeless and was fluctuating appetite and weight loss reports poor energy and motivation he denies current active SI but reports that he had.  He denies homicidal ideation.  He reports feeling anxiety and panic.  He denies recent or current episodes of mania/hypomania.  He denies history of abuse, denies night meds or flashbacks.  He denies auditory/visual hallucinations currently but reports that few weeks ago he was hearing mumbling noises.  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 No.  Has the patient been a risk to self in the past 6 months? No.  Has the patient been a risk to self within the distant past? No.  Is the patient a risk to others? No.  Has the patient been a risk to others in the past 6 months? No.  Has the patient been a risk to others within the distant past? No.   [place] Scale:  Flowsheet Row Admission (Current) from 02/04/2024 in [facility] from 01/31/2024 in [facility] Department at [facility] ED from 01/29/2024 in [facility] Emergency Department at [facility]  C-SSRS RISK CATEGORY Low Risk No Risk No Risk        Past Medical History:  Past Medical History:  Diagnosis Date   Hypertension     Past Surgical History:  Procedure Laterality Date   ABDOMINAL SURGERY     Family History: History reviewed. No pertinent family history.  Social History:  Social History   Substance and Sexual Activity  Alcohol  Use Not Currently     Social History   Substance and Sexual Activity  Drug Use Never      Allergies:   Allergies  Allergen Reactions   Augmentin [Amoxicillin-Pot Clavulanate] Anaphylaxis   Lab Results: No results found for this or any previous visit (from the past 48 hours).  Blood Alcohol  level:  Lab Results  Component  Value Date   [facility] <15 01/31/2024   ETH <10 01/29/2024    Metabolic Disorder Labs:  No results found for: "HGBA1C", "MPG" No results found for: "PROLACTIN" No results found for: "CHOL", "TRIG", "HDL", "CHOLHDL", "VLDL", "LDLCALC"  Current Medications: Current Facility-Administered Medications  Medication Dose Route Frequency Provider Last Rate Last Admin    acetaminophen  (TYLENOL ) tablet 650 mg  650 mg Oral Q6H PRN Motley-Mangrum, Jadeka A, PMHNP       alum & mag hydroxide-simeth (MAALOX/MYLANTA) 200-200-20 MG/5ML suspension 30 mL  30 mL Oral Q4H PRN Motley-Mangrum, Jadeka A, PMHNP       [START ON 02/05/2024] amLODipine  (NORVASC ) tablet 10 mg  10 mg Oral Daily Motley-Mangrum, Jadeka A, PMHNP       haloperidol  (HALDOL ) tablet 5 mg  5 mg Oral TID PRN Motley-Mangrum, Jadeka A, PMHNP       And   diphenhydrAMINE (BENADRYL) capsule 50 mg  50 mg Oral TID PRN Motley-Mangrum, Jadeka A, PMHNP       haloperidol  lactate (HALDOL ) injection 5 mg  5 mg Intramuscular TID PRN Motley-Mangrum, Jadeka A, PMHNP       And   diphenhydrAMINE (BENADRYL) injection 50 mg  50 mg Intramuscular TID PRN Motley-Mangrum, Jadeka A, PMHNP       And   LORazepam  (ATIVAN ) injection 2 mg  2 mg Intramuscular TID PRN Motley-Mangrum, Jadeka A, PMHNP       haloperidol  lactate (HALDOL ) injection 10 mg  10 mg Intramuscular TID PRN Motley-Mangrum, Jadeka A, PMHNP       And   diphenhydrAMINE (BENADRYL) injection 50 mg  50 mg Intramuscular TID PRN Motley-Mangrum, Jadeka A, PMHNP       And   LORazepam  (ATIVAN ) injection 2 mg  2 mg Intramuscular TID PRN Motley-Mangrum, Jadeka A, PMHNP       [START ON 02/05/2024] escitalopram (LEXAPRO) tablet 5 mg  5 mg Oral Daily Motley-Mangrum, Jadeka A, PMHNP       hydrOXYzine (ATARAX) tablet 25 mg  25 mg Oral TID PRN Motley-Mangrum, Jadeka A, PMHNP       [START ON 02/05/2024] influenza vac split trivalent PF (FLULAVAL) injection 0.5 mL  0.5 mL Intramuscular Tomorrow-1000 Aurelia Blotter, MD       LORazepam  (ATIVAN ) tablet 1 mg  1 mg Oral Q4H PRN Motley-Mangrum, Jadeka A, PMHNP       magnesium hydroxide (MILK OF MAGNESIA) suspension 30 mL  30 mL Oral Daily PRN Motley-Mangrum, Jadeka A, PMHNP       OLANZapine  zydis (ZYPREXA ) disintegrating tablet 2.5 mg  2.5 mg Oral BID Motley-Mangrum, Jadeka A, PMHNP       traZODone (DESYREL) tablet 50 mg  50 mg Oral QHS PRN  Motley-Mangrum, Jadeka A, PMHNP       PTA Medications: Medications Prior to Admission  Medication Sig Dispense Refill Last Dose/Taking   amLODipine  (NORVASC ) 10 MG tablet Take 10 mg by mouth daily. (Patient not taking: Reported on 02/02/2024)      OLANZapine  (ZYPREXA ) 5 MG tablet Take 1 tablet (5 mg total) by mouth at bedtime. (Patient not taking: Reported on 02/02/2024) 30 tablet 0     Psychiatric Specialty Exam:  Presentation  General Appearance:  Casual; Disheveled  Eye Contact: Fleeting  Speech: Normal Rate  Speech Volume: Decreased    Mood and Affect  Mood: Dysphoric  Affect: Depressed; Flat   Thought Process  Thought Processes: Disorganized  Descriptions of Associations:Loose  Orientation:Partial  Thought Content:Illogical  Hallucinations:Hallucinations: None  Ideas of Reference:None  Suicidal  Thoughts:Suicidal Thoughts: Yes, Passive SI Passive Intent and/or Plan: Without Intent; Without Plan  Homicidal Thoughts:Homicidal Thoughts: No   Sensorium  Memory: Immediate Fair; Recent Poor; Remote Poor  Judgment: Impaired  Insight: Shallow   Executive Functions  Concentration: Fair  Attention Span: Fair  Recall: Poor  Fund of Knowledge: Poor  Language: Fair   Psychomotor Activity  Psychomotor Activity: Psychomotor Activity: Normal   Assets  Assets: Desire for Improvement; Physical Health    Musculoskeletal: Strength & Muscle Tone: within normal limits Gait & Station: normal  Physical Exam: Physical Exam Vitals and nursing note reviewed.    Review of Systems  Constitutional: Negative.   Cardiovascular: Negative.   Skin: Negative.    Blood pressure 134/88, pulse (!) 107, temperature 98.9 F (37.2 C), temperature source Oral, resp. rate 18, height 5\' 10"  (1.778 m), weight 117.9 kg, SpO2 97%. Body mass index is 37.31 kg/m.  Principal Diagnosis: MDD (major depressive disorder) Diagnosis:  Principal Problem:   MDD  (major depressive disorder)   Clinical Decision Making: Patient with unknown mental health history, living at a group home, cognitive delay admitted for bizarre behaviors, altered mental status endorsing depression.  Patient is paralyzed for further safety evaluation and stabilization  Treatment Plan Summary:  Safety and Monitoring:             -- Voluntary admission to inpatient psychiatric unit for safety, stabilization and treatment             -- Daily contact with patient to assess and evaluate symptoms and progress in treatment             -- Patient's case to be discussed in multi-disciplinary team meeting             -- Observation Level: q15 minute checks             -- Vital signs:  q12 hours             -- Precautions: suicide, elopement, and assault   2. Psychiatric Diagnoses and Treatment:               Lexapro 5 mg daily, Zyprexa  Zydis 2.5 mg twice daily ; Ativan  1 mg every 4 hour as needed needed catatonia-started in ED   -- The risks/benefits/side-effects/alternatives to this medication were discussed in detail with the patient and time was given for questions. The patient consents to medication trial.                -- Metabolic profile and EKG monitoring obtained while on an atypical antipsychotic (BMI: Lipid Panel: HbgA1c: QTc:)              -- Encouraged patient to participate in unit milieu and in scheduled group therapies                            3. Medical Issues Being Addressed:      4. Discharge Planning:              -- Social work and case management to assist with discharge planning and identification of hospital follow-up needs prior to discharge             -- Estimated LOS: 5-7 days             -- Discharge Concerns: Need to establish a safety plan; Medication compliance and effectiveness             --  Discharge Goals: Return home with outpatient referrals follow ups  Physician Treatment Plan for Primary Diagnosis: MDD (major depressive  disorder) Long Term Goal(s): Improvement in symptoms so as ready for discharge  Short Term Goals: Ability to identify changes in lifestyle to reduce recurrence of condition will improve, Ability to verbalize feelings will improve, Ability to disclose and discuss suicidal ideas, Ability to demonstrate self-control will improve, and Ability to identify and develop effective coping behaviors will improve  Physician Treatment Plan for Secondary Diagnosis: Principal Problem:   MDD (major depressive disorder)  Long Term Goal(s): Improvement in symptoms so as ready for discharge  Short Term Goals: Ability to identify changes in lifestyle to reduce recurrence of condition will improve, Ability to verbalize feelings will improve, Ability to disclose and discuss suicidal ideas, Ability to demonstrate self-control will improve, Ability to identify and develop effective coping behaviors will improve, Ability to maintain clinical measurements within normal limits will improve, and Compliance with prescribed medications will improve  I certify that inpatient services furnished can reasonably be expected to improve the patient's condition.    Caleb Muzyka, MD 4/27/20251:42 PM

## 2024-02-04 NOTE — BHH Suicide Risk Assessment (Signed)
 [facility] Admission Suicide Risk Assessment   Nursing information obtained from:  Patient Demographic factors:  Male, Low socioeconomic status, Living alone, Unemployed Current Mental Status:  Suicidal ideation indicated by patient Loss Factors:  NA (Patient reports "life in general." Denies any new stressors) Historical Factors:  NA Risk Reduction Factors:  NA  Total Time spent with patient: 30 minutes Principal Problem: MDD (major depressive disorder) Diagnosis:  Principal Problem:   MDD (major depressive disorder)  Subjective Data: Caleb Leblanc is a 63 y.o. male admitted: Presented to the ED on 01/31/2024  5:11 PM for altered mental status. He carries the psychiatric diagnoses of depression and has a past medical history of catatonia, OSA, constipation. His current presentation of despondence, hopeless, suicidal thoughts, flat affect is most consistent with depression.  Continued Clinical Symptoms:  Alcohol  Use Disorder Identification Test Final Score (AUDIT): 0 The "Alcohol  Use Disorders Identification Test", Guidelines for Use in Primary Care, Second Edition.  World Science writer [facility]). Score between 0-7:  no or low risk or alcohol  related problems. Score between 8-15:  moderate risk of alcohol  related problems. Score between 16-19:  high risk of alcohol  related problems. Score 20 or above:  warrants further diagnostic evaluation for alcohol  dependence and treatment.   CLINICAL FACTORS:   Depression:   Insomnia   Musculoskeletal: Strength & Muscle Tone: within normal limits Gait & Station: normal Patient leans: N/A  Psychiatric Specialty Exam:  Presentation  General Appearance:  Casual; Disheveled  Eye Contact: Fleeting  Speech: Normal Rate  Speech Volume: Decreased  Handedness: Right   Mood and Affect  Mood: Dysphoric  Affect: Depressed; Flat   Thought Process  Thought Processes: Disorganized  Descriptions of  Associations:Loose  Orientation:Partial  Thought Content:Illogical  History of Schizophrenia/Schizoaffective disorder:No data recorded Duration of Psychotic Symptoms:No data recorded Hallucinations:Hallucinations: None  Ideas of Reference:None  Suicidal Thoughts:Suicidal Thoughts: Yes, Passive SI Passive Intent and/or Plan: Without Intent; Without Plan  Homicidal Thoughts:Homicidal Thoughts: No   Sensorium  Memory: Immediate Fair; Recent Poor; Remote Poor  Judgment: Impaired  Insight: Shallow   Executive Functions  Concentration: Fair  Attention Span: Fair  Recall: Poor  Fund of Knowledge: Poor  Language: Fair   Psychomotor Activity  Psychomotor Activity: Psychomotor Activity: Normal   Assets  Assets: Desire for Improvement; Physical Health   Sleep  Sleep: Sleep: Fair    Physical Exam:   Blood pressure 134/88, pulse (!) 107, temperature 98.9 F (37.2 C), temperature source Oral, resp. rate 18, height 5\' 10"  (1.778 m), weight 117.9 kg, SpO2 97%. Body mass index is 37.31 kg/m.   COGNITIVE FEATURES THAT CONTRIBUTE TO RISK:  None    SUICIDE RISK:   Minimal: No identifiable suicidal ideation.  Patients presenting with no risk factors but with morbid ruminations; may be classified as minimal risk based on the severity of the depressive symptoms  PLAN OF CARE: Patient is admitted to [facility] psych unit with Q15 min safety monitoring. Multidisciplinary team approach is offered. Medication management; group/milieu therapy is offered.   I certify that inpatient services furnished can reasonably be expected to improve the patient's condition.   Aurelia Blotter, MD 02/04/2024, 1:40 PM

## 2024-02-04 NOTE — Group Note (Signed)
 Date:  02/04/2024 Time:  10:38 PM  Group Topic/Focus:  Wrap-Up Group:   The focus of this group is to help patients review their daily goal of treatment and discuss progress on daily workbooks.    Participation Level:  Active  Participation Quality:  Appropriate  Affect:  Appropriate  Cognitive:  Appropriate  Insight: Appropriate  Engagement in Group:  Engaged  Modes of Intervention:  Discussion  Bradley Caffey 02/04/2024, 10:38 PM

## 2024-02-05 DIAGNOSIS — F331 Major depressive disorder, recurrent, moderate: Secondary | ICD-10-CM | POA: Diagnosis not present

## 2024-02-05 MED ORDER — ESCITALOPRAM OXALATE 10 MG PO TABS
10.0000 mg | ORAL_TABLET | Freq: Every day | ORAL | Status: DC
  Administered 2024-02-06 – 2024-02-23 (×18): 10 mg via ORAL
  Filled 2024-02-05 (×18): qty 1

## 2024-02-05 NOTE — BH Assessment (Signed)
 Recreation Therapy Notes  INPATIENT RECREATION THERAPY ASSESSMENT  Patient Details Name: Caleb Leblanc MRN: [id number] DOB: [birth year] Today's Date: 02/05/2024       Information Obtained From: Patient (In addition to chart review)  Able to Participate in Assessment/Interview:  Yes  Patient Presentation: Responsive, Alert  Reason for Admission (Per Patient): Other (Comments) ("my memory and the dysfunctions")  Patient Stressors:  ("my home, not having anyone to talk to and my world was just upside down")  Coping Skills:    (Unable to identify)  Leisure Interests (2+):  Individual - Reading  Frequency of Recreation/Participation: Weekly  Awareness of Community Resources:  Yes  Community Resources:  Public affairs consultant  Current Use: Yes  Expressed Interest in [employer] Information: No  [employer] of Residence:  Warden/ranger"  Patient Main Form of Transportation:  ("If I can get a ride from [employer], I do")  Patient Strengths:  "being on time, I do what I am told to do, and participating"  Patient Identified Areas of Improvement:  "everything"  Patient Goal for Hospitalization:  "to get my mind right"  Current SI (including self-harm):  No  Current HI:  No  Current AVH: No  Staff Intervention Plan: Group Attendance, Collaborate with Interdisciplinary Treatment Team  Consent to Intern Participation: N/A  Deatrice Factor 02/05/2024, 5:04 PM

## 2024-02-05 NOTE — Plan of Care (Signed)
 Problem: Depression Goal: STG - Patient will identify 3 positive coping skills to decrease depressive symptoms within 5 recreation therapy group sessions Description: STG - Patient will identify 3 positive coping skills to decrease depressive symptoms within 5 recreation therapy group sessions Outcome: Not Applicable

## 2024-02-05 NOTE — Group Note (Signed)
 Date:  02/05/2024 Time:  11:51 AM  Group Topic/Focus:  Goals Group:   The focus of this group is to help patients establish daily goals to achieve during treatment and discuss how the patient can incorporate goal setting into their daily lives to aide in recovery.    Participation Level:  Active  Participation Quality:  Appropriate  Affect:  Appropriate  Cognitive:  Appropriate  Insight: Appropriate  Engagement in Group:  Engaged  Modes of Intervention:  Discussion   [name] 02/05/2024, 11:51 AM

## 2024-02-05 NOTE — Group Note (Signed)
 Date:  02/05/2024 Time:  9:34 PM  Group Topic/Focus:  Wrap-Up Group:   The focus of this group is to help patients review their daily goal of treatment and discuss progress on daily workbooks.    Participation Level:  Active  Participation Quality:  Appropriate  Affect:  Appropriate  Cognitive:  Appropriate  Insight: Appropriate  Engagement in Group:  Engaged  Modes of Intervention:  Discussion  Additional Comments:    Rolland Cline 02/05/2024, 9:34 PM

## 2024-02-05 NOTE — Progress Notes (Signed)
   02/05/24 0747  Psych Admission Type (Psych Patients Only)  Admission Status Voluntary  Psychosocial Assessment  Patient Complaints Anxiety;Depression  Eye Contact Brief  Facial Expression Flat  Affect Sad  Speech Slow  Interaction Minimal  Motor Activity Slow  Appearance/Hygiene Disheveled  Behavior Characteristics Cooperative  Mood Sad  Thought Process  Coherency WDL  Content WDL  Delusions None reported or observed  Perception WDL  Hallucination None reported or observed  Judgment Impaired  Confusion Mild  Danger to Self  Current suicidal ideation? Denies

## 2024-02-05 NOTE — BH IP Treatment Plan (Signed)
 Interdisciplinary Treatment and Diagnostic Plan Update  02/05/2024 Time of Session: 11:01 AM  Caleb Leblanc MRN: [id number]  Principal Diagnosis: MDD (major depressive disorder)  Secondary Diagnoses: Principal Problem:   MDD (major depressive disorder)   Current Medications:  Current Facility-Administered Medications  Medication Dose Route Frequency Provider Last Rate Last Admin   acetaminophen  (TYLENOL ) tablet 650 mg  650 mg Oral Q6H PRN Motley-Mangrum, Jadeka A, PMHNP       alum & mag hydroxide-simeth (MAALOX/MYLANTA) 200-200-20 MG/5ML suspension 30 mL  30 mL Oral Q4H PRN Motley-Mangrum, Jadeka A, PMHNP       amLODipine  (NORVASC ) tablet 10 mg  10 mg Oral Daily Motley-Mangrum, Jadeka A, PMHNP   10 mg at 02/05/24 1052   haloperidol  (HALDOL ) tablet 5 mg  5 mg Oral TID PRN Motley-Mangrum, Jadeka A, PMHNP       And   diphenhydrAMINE (BENADRYL) capsule 50 mg  50 mg Oral TID PRN Motley-Mangrum, Jadeka A, PMHNP       haloperidol  lactate (HALDOL ) injection 5 mg  5 mg Intramuscular TID PRN Motley-Mangrum, Jadeka A, PMHNP       And   diphenhydrAMINE (BENADRYL) injection 50 mg  50 mg Intramuscular TID PRN Motley-Mangrum, Jadeka A, PMHNP       And   LORazepam  (ATIVAN ) injection 2 mg  2 mg Intramuscular TID PRN Motley-Mangrum, Jadeka A, PMHNP       haloperidol  lactate (HALDOL ) injection 10 mg  10 mg Intramuscular TID PRN Motley-Mangrum, Jadeka A, PMHNP       And   diphenhydrAMINE (BENADRYL) injection 50 mg  50 mg Intramuscular TID PRN Motley-Mangrum, Jadeka A, PMHNP       And   LORazepam  (ATIVAN ) injection 2 mg  2 mg Intramuscular TID PRN Motley-Mangrum, Jadeka A, PMHNP       escitalopram (LEXAPRO) tablet 5 mg  5 mg Oral Daily Motley-Mangrum, Jadeka A, PMHNP   5 mg at 02/05/24 1052   hydrOXYzine (ATARAX) tablet 25 mg  25 mg Oral TID PRN Motley-Mangrum, Jadeka A, PMHNP       influenza vac split trivalent PF (FLULAVAL) injection 0.5 mL  0.5 mL Intramuscular Tomorrow-1000 Aurelia Blotter, MD        LORazepam  (ATIVAN ) tablet 1 mg  1 mg Oral Q4H PRN Motley-Mangrum, Jadeka A, PMHNP       magnesium hydroxide (MILK OF MAGNESIA) suspension 30 mL  30 mL Oral Daily PRN Motley-Mangrum, Jadeka A, PMHNP       OLANZapine  zydis (ZYPREXA ) disintegrating tablet 2.5 mg  2.5 mg Oral BID Motley-Mangrum, Jadeka A, PMHNP   2.5 mg at 02/05/24 1052   traZODone (DESYREL) tablet 50 mg  50 mg Oral QHS PRN Motley-Mangrum, Jadeka A, PMHNP   50 mg at 02/04/24 2146   PTA Medications: Medications Prior to Admission  Medication Sig Dispense Refill Last Dose/Taking   amLODipine  (NORVASC ) 10 MG tablet Take 10 mg by mouth daily. (Patient not taking: Reported on 02/02/2024)      OLANZapine  (ZYPREXA ) 5 MG tablet Take 1 tablet (5 mg total) by mouth at bedtime. (Patient not taking: Reported on 02/02/2024) 30 tablet 0     Patient Stressors: Other: "Life in general." Patient was unable/refused to go into further detail.     Patient Strengths: Other: Patient declines  Treatment Modalities: Medication Management, Group therapy, Case management,  1 to 1 session with clinician, Psychoeducation, Recreational therapy.   Physician Treatment Plan for Primary Diagnosis: MDD (major depressive disorder) Long Term Goal(s): Improvement in symptoms so  as ready for discharge   Short Term Goals: Ability to identify changes in lifestyle to reduce recurrence of condition will improve Ability to verbalize feelings will improve Ability to disclose and discuss suicidal ideas Ability to demonstrate self-control will improve Ability to identify and develop effective coping behaviors will improve Ability to maintain clinical measurements within normal limits will improve Compliance with prescribed medications will improve  Medication Management: Evaluate patient's response, side effects, and tolerance of medication regimen.  Therapeutic Interventions: 1 to 1 sessions, Unit Group sessions and Medication administration.  Evaluation of  Outcomes: Not Progressing  Physician Treatment Plan for Secondary Diagnosis: Principal Problem:   MDD (major depressive disorder)  Long Term Goal(s): Improvement in symptoms so as ready for discharge   Short Term Goals: Ability to identify changes in lifestyle to reduce recurrence of condition will improve Ability to verbalize feelings will improve Ability to disclose and discuss suicidal ideas Ability to demonstrate self-control will improve Ability to identify and develop effective coping behaviors will improve Ability to maintain clinical measurements within normal limits will improve Compliance with prescribed medications will improve     Medication Management: Evaluate patient's response, side effects, and tolerance of medication regimen.  Therapeutic Interventions: 1 to 1 sessions, Unit Group sessions and Medication administration.  Evaluation of Outcomes: Not Progressing   RN Treatment Plan for Primary Diagnosis: MDD (major depressive disorder) Long Term Goal(s): Knowledge of disease and therapeutic regimen to maintain health will improve  Short Term Goals: Ability to remain free from injury will improve, Ability to verbalize frustration and anger appropriately will improve, Ability to demonstrate self-control, Ability to participate in decision making will improve, Ability to verbalize feelings will improve, Ability to disclose and discuss suicidal ideas, Ability to identify and develop effective coping behaviors will improve, and Compliance with prescribed medications will improve  Medication Management: RN will administer medications as ordered by provider, will assess and evaluate patient's response and provide education to patient for prescribed medication. RN will report any adverse and/or side effects to prescribing provider.  Therapeutic Interventions: 1 on 1 counseling sessions, Psychoeducation, Medication administration, Evaluate responses to treatment, Monitor vital  signs and CBGs as ordered, Perform/monitor CIWA, COWS, AIMS and Fall Risk screenings as ordered, Perform wound care treatments as ordered.  Evaluation of Outcomes: Not Progressing   LCSW Treatment Plan for Primary Diagnosis: MDD (major depressive disorder) Long Term Goal(s): Safe transition to appropriate next level of care at discharge, Engage patient in therapeutic group addressing interpersonal concerns.  Short Term Goals: Engage patient in aftercare planning with referrals and resources, Increase social support, Increase ability to appropriately verbalize feelings, Increase emotional regulation, Facilitate acceptance of mental health diagnosis and concerns, Facilitate patient progression through stages of change regarding substance use diagnoses and concerns, Identify triggers associated with mental health/substance abuse issues, and Increase skills for wellness and recovery  Therapeutic Interventions: Assess for all discharge needs, 1 to 1 time with Social worker, Explore available resources and support systems, Assess for adequacy in community support network, Educate family and significant other(s) on suicide prevention, Complete Psychosocial Assessment, Interpersonal group therapy.  Evaluation of Outcomes: Not Progressing   Progress in Treatment: Attending groups: Yes. Participating in groups: Yes. Taking medication as prescribed: Yes. Toleration medication: Yes. Family/Significant other contact made: No, will contact:  CSW will contact if given permission  Patient understands diagnosis: Yes. Discussing patient identified problems/goals with staff: Yes. Medical problems stabilized or resolved: Yes. Denies suicidal/homicidal ideation: Yes. Issues/concerns per patient self-inventory: No. Other: None  New problem(s) identified: No, Describe:  None identified   New Short Term/Long Term Goal(s): elimination of symptoms of psychosis, medication management for mood stabilization;  elimination of SI thoughts; development of comprehensive mental wellness plan.   Patient Goals:  "I'm here to get to normal status again"   Discharge Plan or Barriers: CSW will assist with appropriate discharge planning   Reason for Continuation of Hospitalization: Medication stabilization  Estimated Length of Stay: 1 to 7 days  Last 3 [place] Suicide Severity Risk Score: Flowsheet Row Admission (Current) from 02/04/2024 in [facility] ED from 01/31/2024 in [facility] Emergency Department at [facility] ED from 01/29/2024 in [facility] Emergency Department at [facility]  C-SSRS RISK CATEGORY Low Risk No Risk No Risk       Last [facility] 2/9 Scores:     No data to display          Scribe for Treatment Team: Claudio Culver, Milinda Allen 02/05/2024 1:55 PM

## 2024-02-05 NOTE — Group Note (Signed)
 Recreation Therapy Group Note   Group Topic:Coping Skills  Group Date: 02/05/2024 Start Time: 1500 End Time: 1600 Facilitators: Deatrice Factor, LRT, CTRS Location: Courtyard  Group Description: Mind Map.  Patient was provided a blank template of a diagram with 32 blank boxes in a tiered system, branching from the center (similar to a bubble chart). LRT directed patients to label the middle of the diagram "Coping Skills". LRT and patients then came up with 8 different coping skills as examples. Pt were directed to record their coping skills in the 2nd tier boxes closest to the center.  Patients would then share their coping skills with the group as LRT wrote them out. LRT gave a handout of 99 different coping skills at the end of group.   Goal Area(s) Addressed: Patients will be able to define "coping skills". Patient will identify new coping skills.  Patient will increase communication.   Affect/Mood: Appropriate   Participation Level: Active and Engaged   Participation Quality: Independent   Behavior: Appropriate, Calm, and Cooperative   Speech/Thought Process: Coherent   Insight: Good   Judgement: Good   Modes of Intervention: Clarification, Education, Worksheet, and Writing   Patient Response to Interventions:  Attentive, Engaged, Interested , and Receptive   Education Outcome:  Acknowledges education   Clinical Observations/Individualized Feedback: Caleb Leblanc was active in their participation of session activities and group discussion. Pt identified "horse shoes and spades" as coping skills. Pt interacted well with LRT and peers duration of session.    Plan: Continue to engage patient in RT group sessions 2-3x/week.   Deatrice Factor, LRT, CTRS 02/05/2024 4:57 PM

## 2024-02-05 NOTE — Progress Notes (Signed)
 [facility] MD Progress Note  02/05/2024 4:40 PM Caleb Leblanc  MRN:  [id number]  Caleb Leblanc is a 63 y.o. male admitted: Presented to the ED on 01/31/2024  5:11 PM for altered mental status. He carries the psychiatric diagnoses of depression and has a past medical history of catatonia, OSA, constipation. His current presentation of despondence, hopeless, suicidal thoughts, flat affect is most consistent with depression.   Subjective:  Chart reviewed, case discussed in multidisciplinary meeting, patient seen during rounds.  Patient met with the treatment team today.  He expressed his frustration that he has hard time understanding things and managing his life and money.  He reports feeling depressed and hopeless.  He denies suicidal/homicidal ideation.  He denies auditory/visual hallucinations today.  He reports that he is doing well on medication.  He is requesting for some help with sleep.   Sleep: Fair  Appetite:  Fair  Past Psychiatric History: see h&P Family History: History reviewed. No pertinent family history. Social History:  Social History   Substance and Sexual Activity  Alcohol  Use Not Currently     Social History   Substance and Sexual Activity  Drug Use Never    Social History   Socioeconomic History   Marital status: Widowed    Spouse name: Not on file   Number of children: Not on file   Years of education: Not on file   Highest education level: Not on file  Occupational History   Not on file  Tobacco Use   Smoking status: Former    Current packs/day: 0.00    Types: Cigarettes    Quit date: 09/09/2017    Years since quitting: 6.4   Smokeless tobacco: Never   Tobacco comments:    pt states he smoked 4 cigs/day before he quit  Vaping Use   Vaping status: Never Used  Substance and Sexual Activity   Alcohol  use: Not Currently   Drug use: Never   Sexual activity: Not on file  Other Topics Concern   Not on file  Social History Narrative   Not on file    Social Drivers of Health   Financial Resource Strain: Not on file  Food Insecurity: Food Insecurity Present (02/04/2024)   Hunger Vital Sign    Worried About Running Out of Food in the Last Year: Often true    Ran Out of Food in the Last Year: Often true  Transportation Needs: Unmet Transportation Needs (02/04/2024)   PRAPARE - Administrator, Civil Service (Medical): Yes    Lack of Transportation (Non-Medical): Yes  Physical Activity: Not on file  Stress: Not on file  Social Connections: Socially Isolated (01/18/2024)   Social Connection and Isolation Panel [NHANES]    Frequency of Communication with Friends and Family: Never    Frequency of Social Gatherings with Friends and Family: Never    Attends Religious Services: Never    Database administrator or Organizations: No    Attends Engineer, structural: Never    Marital Status: Never married   Past Medical History:  Past Medical History:  Diagnosis Date   Hypertension     Past Surgical History:  Procedure Laterality Date   ABDOMINAL SURGERY      Current Medications: Current Facility-Administered Medications  Medication Dose Route Frequency Provider Last Rate Last Admin   acetaminophen  (TYLENOL ) tablet 650 mg  650 mg Oral Q6H PRN Motley-Mangrum, Jadeka A, PMHNP       alum & mag hydroxide-simeth (  MAALOX/MYLANTA) 200-200-20 MG/5ML suspension 30 mL  30 mL Oral Q4H PRN Motley-Mangrum, Jadeka A, PMHNP       amLODipine  (NORVASC ) tablet 10 mg  10 mg Oral Daily Motley-Mangrum, Jadeka A, PMHNP   10 mg at 02/05/24 1052   haloperidol  (HALDOL ) tablet 5 mg  5 mg Oral TID PRN Motley-Mangrum, Jadeka A, PMHNP       And   diphenhydrAMINE (BENADRYL) capsule 50 mg  50 mg Oral TID PRN Motley-Mangrum, Jadeka A, PMHNP       haloperidol  lactate (HALDOL ) injection 5 mg  5 mg Intramuscular TID PRN Motley-Mangrum, Jadeka A, PMHNP       And   diphenhydrAMINE (BENADRYL) injection 50 mg  50 mg Intramuscular TID PRN Motley-Mangrum,  Jadeka A, PMHNP       And   LORazepam  (ATIVAN ) injection 2 mg  2 mg Intramuscular TID PRN Motley-Mangrum, Jadeka A, PMHNP       haloperidol  lactate (HALDOL ) injection 10 mg  10 mg Intramuscular TID PRN Motley-Mangrum, Jadeka A, PMHNP       And   diphenhydrAMINE (BENADRYL) injection 50 mg  50 mg Intramuscular TID PRN Motley-Mangrum, Jadeka A, PMHNP       And   LORazepam  (ATIVAN ) injection 2 mg  2 mg Intramuscular TID PRN Motley-Mangrum, Jadeka A, PMHNP       escitalopram (LEXAPRO) tablet 5 mg  5 mg Oral Daily Motley-Mangrum, Jadeka A, PMHNP   5 mg at 02/05/24 1052   hydrOXYzine (ATARAX) tablet 25 mg  25 mg Oral TID PRN Motley-Mangrum, Jadeka A, PMHNP       influenza vac split trivalent PF (FLULAVAL) injection 0.5 mL  0.5 mL Intramuscular Tomorrow-1000 Aurelia Blotter, MD       LORazepam  (ATIVAN ) tablet 1 mg  1 mg Oral Q4H PRN Motley-Mangrum, Jadeka A, PMHNP       magnesium hydroxide (MILK OF MAGNESIA) suspension 30 mL  30 mL Oral Daily PRN Motley-Mangrum, Jadeka A, PMHNP       OLANZapine  zydis (ZYPREXA ) disintegrating tablet 2.5 mg  2.5 mg Oral BID Motley-Mangrum, Jadeka A, PMHNP   2.5 mg at 02/05/24 1052   traZODone (DESYREL) tablet 50 mg  50 mg Oral QHS PRN Motley-Mangrum, Jadeka A, PMHNP   50 mg at 02/04/24 2146    Lab Results: No results found for this or any previous visit (from the past 48 hours).  Blood Alcohol  level:  Lab Results  Component Value Date   [facility] <15 01/31/2024   ETH <10 01/29/2024    Metabolic Disorder Labs: No results found for: "HGBA1C", "MPG" No results found for: "PROLACTIN" No results found for: "CHOL", "TRIG", "HDL", "CHOLHDL", "VLDL", "LDLCALC"  Physical Findings: AIMS:  , ,  ,  ,    CIWA:    COWS:      Psychiatric Specialty Exam:  Presentation  General Appearance:  Appropriate for Environment; Casual  Eye Contact: Fair  Speech: Normal Rate  Speech Volume: Decreased    Mood and Affect  Mood: Anxious; Depressed  Affect: Depressed;  Flat   Thought Process  Thought Processes: Disorganized  Descriptions of Associations:Loose  Orientation:Partial  Thought Content:Illogical  Hallucinations:Hallucinations: None  Ideas of Reference:None  Suicidal Thoughts:Suicidal Thoughts: No SI Passive Intent and/or Plan: Without Intent; Without Plan  Homicidal Thoughts:Homicidal Thoughts: No   Sensorium  Memory: Immediate Fair; Recent Poor; Remote Poor  Judgment: Impaired  Insight: Shallow   Executive Functions  Concentration: Poor  Attention Span: Poor  Recall: Poor  Fund of Knowledge: Poor  Language: Poor   Psychomotor Activity  Psychomotor Activity: Psychomotor Activity: Normal  Musculoskeletal: Strength & Muscle Tone: within normal limits Gait & Station: normal Assets  Assets: Desire for Improvement; Resilience    Physical Exam: Physical Exam Vitals and nursing note reviewed.    ROS Blood pressure 121/77, pulse (!) 101, temperature (!) 97.5 F (36.4 C), resp. rate 18, height 5\' 10"  (1.778 m), weight 117.9 kg, SpO2 99%. Body mass index is 37.31 kg/m.  Diagnosis: Principal Problem:   MDD (major depressive disorder)    Clinical Decision Making: Patient with unknown mental health history, living at a group home, cognitive delay admitted for bizarre behaviors, altered mental status endorsing depression.  Patient is paralyzed for further safety evaluation and stabilization   Treatment Plan Summary:   Safety and Monitoring:             -- Voluntary admission to inpatient psychiatric unit for safety, stabilization and treatment             -- Daily contact with patient to assess and evaluate symptoms and progress in treatment             -- Patient's case to be discussed in multi-disciplinary team meeting             -- Observation Level: q15 minute checks             -- Vital signs:  q12 hours             -- Precautions: suicide, elopement, and assault   2. Psychiatric Diagnoses  and Treatment:               Lexapro 5 mg daily-plan to titrate up to 10 mg tomorrow Zyprexa  Zydis 2.5 mg twice daily ;  Ativan  1 mg every 4 hour as needed needed catatonia-started in ED   -- The risks/benefits/side-effects/alternatives to this medication were discussed in detail with the patient and time was given for questions. The patient consents to medication trial.                -- Metabolic profile and EKG monitoring obtained while on an atypical antipsychotic (BMI: Lipid Panel: HbgA1c: QTc:)              -- Encouraged patient to participate in unit milieu and in scheduled group therapies                            3. Medical Issues Being Addressed:  No urgent medical needs identified   4. Discharge Planning:              -- Social work and case management to assist with discharge planning and identification of hospital follow-up needs prior to discharge             -- Estimated LOS: 5-7 days             -- Discharge Concerns: Need to establish a safety plan; Medication compliance and effectiveness             -- Discharge Goals: Return home with outpatient referrals follow ups   Physician Treatment Plan for Primary Diagnosis: MDD (major depressive disorder) Long Term Goal(s): Improvement in symptoms so as ready for discharge   Short Term Goals: Ability to identify changes in lifestyle to reduce recurrence of condition will improve, Ability to verbalize feelings will improve, Ability to disclose and discuss suicidal ideas, Ability to demonstrate self-control will  improve, and Ability to identify and develop effective coping behaviors will improve   Physician Treatment Plan for Secondary Diagnosis: Principal Problem:   MDD (major depressive disorder)   Long Term Goal(s): Improvement in symptoms so as ready for discharge   Short Term Goals: Ability to identify changes in lifestyle to reduce recurrence of condition will improve, Ability to verbalize feelings will improve, Ability  to disclose and discuss suicidal ideas, Ability to demonstrate self-control will improve, Ability to identify and develop effective coping behaviors will improve, Ability to maintain clinical measurements within normal limits will improve, and Compliance with prescribed medications will improve  Romelia Bromell, MD 02/05/2024, 4:40 PM

## 2024-02-06 DIAGNOSIS — F331 Major depressive disorder, recurrent, moderate: Secondary | ICD-10-CM | POA: Diagnosis not present

## 2024-02-06 NOTE — Group Note (Signed)
 Recreation Therapy Group Note   Group Topic:Coping Skills  Group Date: 02/06/2024 Start Time: 1100 End Time: 1140 Facilitators: Deatrice Factor, LRT, CTRS Location:  Dayroom  Group Description: Seated Exercise. LRT discussed the mental and physical benefits of exercise. LRT and group discussed how physical activity can be used as a coping skill. Pt's and LRT followed along to an exercise video on the TV screen that provided a visual representation and audio description of every exercise performed. Pt's encouraged to listen to their bodies and stop at any time if they experience feelings of discomfort or pain. Pts were encouraged to drink water and stay hydrated.   Goal Area(s) Addressed: Patient will learn benefits of physical activity. Patient will identify exercise as a coping skill.  Patient will follow multistep directions. Patient will try a new leisure interest.    Affect/Mood: Appropriate   Participation Level: Minimal    Clinical Observations/Individualized Feedback: Sarath was present in the dayroom at the time of group. Pt minimally participated in the exercises, although he was one who chose "seated exercise" as a group intervention.   Plan: Continue to engage patient in RT group sessions 2-3x/week.   [address] LRT, CTRS 02/06/2024 1:24 PM

## 2024-02-06 NOTE — Progress Notes (Signed)
   02/05/24 2200  Psych Admission Type (Psych Patients Only)  Admission Status Voluntary  Psychosocial Assessment  Patient Complaints Anxiety;Depression  Eye Contact Brief  Facial Expression Flat  Affect Appropriate to circumstance  Speech Slow  Interaction Minimal  Motor Activity Slow  Appearance/Hygiene Disheveled  Behavior Characteristics Cooperative  Mood Depressed  Thought Process  Coherency WDL  Content WDL  Delusions None reported or observed  Perception WDL  Hallucination None reported or observed  Judgment Impaired  Confusion Mild  Danger to Self  Current suicidal ideation? Denies

## 2024-02-06 NOTE — Progress Notes (Signed)
   02/06/24 0746  Psych Admission Type (Psych Patients Only)  Admission Status Voluntary  Psychosocial Assessment  Patient Complaints Anxiety;Depression  Eye Contact Brief  Facial Expression Flat  Affect Sad  Speech Slow  Interaction Minimal  Motor Activity Slow  Appearance/Hygiene Disheveled  Behavior Characteristics Cooperative  Mood Depressed  Thought Process  Coherency WDL  Content WDL  Delusions None reported or observed  Perception WDL  Hallucination None reported or observed  Judgment Impaired  Confusion Mild  Danger to Self  Current suicidal ideation? Denies  Danger to Others  Danger to Others None reported or observed

## 2024-02-06 NOTE — Plan of Care (Signed)

## 2024-02-06 NOTE — Progress Notes (Signed)
   02/06/24 0625  15 Minute Checks  Location Bedroom  Visual Appearance Calm  Behavior Sleeping  Sleep (Behavioral Health Patients Only)  Calculate sleep? (Click Yes once per 24 hr at 0600 safety check) Yes  Documented sleep last 24 hours 9

## 2024-02-06 NOTE — Plan of Care (Signed)

## 2024-02-06 NOTE — Progress Notes (Signed)
 [facility] MD Progress Note  02/06/2024 10:01 PM Caleb Leblanc  MRN:  [id number]  Caleb Leblanc is a 63 y.o. male admitted: Presented to the ED on 01/31/2024  5:11 PM for altered mental status. He carries the psychiatric diagnoses of depression and has a past medical history of catatonia, OSA, constipation. His current presentation of despondence, hopeless, suicidal thoughts, flat affect is most consistent with depression.   Subjective:  Chart reviewed, case discussed in multidisciplinary meeting, patient seen during rounds.  Patient is noted to be sitting in the day area and engaging with peers.  He offers no complaints.  He continues to endorse depression and hopelessness about his situation.  He reports that he cannot process information to the fullest extent.  He denies SI/HI.  He reports hallucinations at times but denies command type.  He is taking his medications with no problems.  He reports poor sleep.  Provider discussed increasing his nighttime Zyprexa  dose to 5 mg.  Patient agreed with the plan   Sleep: Fair  Appetite:  Fair  Past Psychiatric History: see h&P Family History: History reviewed. No pertinent family history. Social History:  Social History   Substance and Sexual Activity  Alcohol  Use Not Currently     Social History   Substance and Sexual Activity  Drug Use Never    Social History   Socioeconomic History   Marital status: Widowed    Spouse name: Not on file   Number of children: Not on file   Years of education: Not on file   Highest education level: Not on file  Occupational History   Not on file  Tobacco Use   Smoking status: Former    Current packs/day: 0.00    Types: Cigarettes    Quit date: 09/09/2017    Years since quitting: 6.4   Smokeless tobacco: Never   Tobacco comments:    pt states he smoked 4 cigs/day before he quit  Vaping Use   Vaping status: Never Used  Substance and Sexual Activity   Alcohol  use: Not Currently   Drug use: Never    Sexual activity: Not on file  Other Topics Concern   Not on file  Social History Narrative   Not on file   Social Drivers of Health   Financial Resource Strain: Not on file  Food Insecurity: Food Insecurity Present (02/04/2024)   Hunger Vital Sign    Worried About Running Out of Food in the Last Year: Often true    Ran Out of Food in the Last Year: Often true  Transportation Needs: Unmet Transportation Needs (02/04/2024)   PRAPARE - Administrator, Civil Service (Medical): Yes    Lack of Transportation (Non-Medical): Yes  Physical Activity: Not on file  Stress: Not on file  Social Connections: Socially Isolated (01/18/2024)   Social Connection and Isolation Panel [NHANES]    Frequency of Communication with Friends and Family: Never    Frequency of Social Gatherings with Friends and Family: Never    Attends Religious Services: Never    Database administrator or Organizations: No    Attends Engineer, structural: Never    Marital Status: Never married   Past Medical History:  Past Medical History:  Diagnosis Date   Hypertension     Past Surgical History:  Procedure Laterality Date   ABDOMINAL SURGERY      Current Medications: Current Facility-Administered Medications  Medication Dose Route Frequency Provider Last Rate Last Admin  acetaminophen  (TYLENOL ) tablet 650 mg  650 mg Oral Q6H PRN Motley-Mangrum, Jadeka A, PMHNP       alum & mag hydroxide-simeth (MAALOX/MYLANTA) 200-200-20 MG/5ML suspension 30 mL  30 mL Oral Q4H PRN Motley-Mangrum, Jadeka A, PMHNP       amLODipine  (NORVASC ) tablet 10 mg  10 mg Oral Daily Motley-Mangrum, Jadeka A, PMHNP   10 mg at 02/06/24 1027   haloperidol  (HALDOL ) tablet 5 mg  5 mg Oral TID PRN Motley-Mangrum, Jadeka A, PMHNP       And   diphenhydrAMINE (BENADRYL) capsule 50 mg  50 mg Oral TID PRN Motley-Mangrum, Jadeka A, PMHNP       haloperidol  lactate (HALDOL ) injection 5 mg  5 mg Intramuscular TID PRN Motley-Mangrum, Jadeka  A, PMHNP       And   diphenhydrAMINE (BENADRYL) injection 50 mg  50 mg Intramuscular TID PRN Motley-Mangrum, Jadeka A, PMHNP       And   LORazepam  (ATIVAN ) injection 2 mg  2 mg Intramuscular TID PRN Motley-Mangrum, Jadeka A, PMHNP       haloperidol  lactate (HALDOL ) injection 10 mg  10 mg Intramuscular TID PRN Motley-Mangrum, Jadeka A, PMHNP       And   diphenhydrAMINE (BENADRYL) injection 50 mg  50 mg Intramuscular TID PRN Motley-Mangrum, Jadeka A, PMHNP       And   LORazepam  (ATIVAN ) injection 2 mg  2 mg Intramuscular TID PRN Motley-Mangrum, Jadeka A, PMHNP       escitalopram (LEXAPRO) tablet 10 mg  10 mg Oral Daily Aarini Slee, MD   10 mg at 02/06/24 0818   hydrOXYzine (ATARAX) tablet 25 mg  25 mg Oral TID PRN Motley-Mangrum, Jadeka A, PMHNP       influenza vac split trivalent PF (FLULAVAL) injection 0.5 mL  0.5 mL Intramuscular Tomorrow-1000 Aurelia Blotter, MD       LORazepam  (ATIVAN ) tablet 1 mg  1 mg Oral Q4H PRN Motley-Mangrum, Jadeka A, PMHNP       magnesium hydroxide (MILK OF MAGNESIA) suspension 30 mL  30 mL Oral Daily PRN Motley-Mangrum, Jadeka A, PMHNP       OLANZapine  zydis (ZYPREXA ) disintegrating tablet 2.5 mg  2.5 mg Oral BID Motley-Mangrum, Jadeka A, PMHNP   2.5 mg at 02/06/24 2119   traZODone (DESYREL) tablet 50 mg  50 mg Oral QHS PRN Motley-Mangrum, Jadeka A, PMHNP   50 mg at 02/06/24 2119    Lab Results: No results found for this or any previous visit (from the past 48 hours).  Blood Alcohol  level:  Lab Results  Component Value Date   [facility] <15 01/31/2024   ETH <10 01/29/2024    Metabolic Disorder Labs: No results found for: "HGBA1C", "MPG" No results found for: "PROLACTIN" No results found for: "CHOL", "TRIG", "HDL", "CHOLHDL", "VLDL", "LDLCALC"  Physical Findings: AIMS:  , ,  ,  ,    CIWA:    COWS:      Psychiatric Specialty Exam:  Presentation  General Appearance:  Appropriate for Environment; Casual  Eye Contact: Fair  Speech: Normal  Rate  Speech Volume: Decreased    Mood and Affect  Mood: Anxious; Depressed  Affect: Depressed; Flat   Thought Process  Thought Processes: Disorganized  Descriptions of Associations:Loose  Orientation:Partial  Thought Content:Illogical  Hallucinations:Hallucinations: None  Ideas of Reference:None  Suicidal Thoughts:Suicidal Thoughts: No  Homicidal Thoughts:Homicidal Thoughts: No   Sensorium  Memory: Immediate Fair; Recent Poor; Remote Poor  Judgment: Impaired  Insight: Community education officer  Concentration: Poor  Attention Span: Poor  Recall: Poor  Fund of Knowledge: Poor  Language: Poor   Psychomotor Activity  Psychomotor Activity: Psychomotor Activity: Normal  Musculoskeletal: Strength & Muscle Tone: within normal limits Gait & Station: normal Assets  Assets: Desire for Improvement; Resilience    Physical Exam: Physical Exam Vitals and nursing note reviewed.    ROS Blood pressure 137/85, pulse 79, temperature 98.4 F (36.9 C), resp. rate 18, height 5\' 10"  (1.778 m), weight 117.9 kg, SpO2 98%. Body mass index is 37.31 kg/m.  Diagnosis: Principal Problem:   MDD (major depressive disorder)    Clinical Decision Making: Patient with unknown mental health history, living at a group home, cognitive delay admitted for bizarre behaviors, altered mental status endorsing depression.  Patient is paralyzed for further safety evaluation and stabilization   Treatment Plan Summary:   Safety and Monitoring:             -- Voluntary admission to inpatient psychiatric unit for safety, stabilization and treatment             -- Daily contact with patient to assess and evaluate symptoms and progress in treatment             -- Patient's case to be discussed in multi-disciplinary team meeting             -- Observation Level: q15 minute checks             -- Vital signs:  q12 hours             -- Precautions: suicide, elopement,  and assault   2. Psychiatric Diagnoses and Treatment:               Lexapro 5 mg daily-plan to titrate up to 10 mg tomorrow Increased zyprexa  to 2.5 mg QAM and 5 mg QHS Ativan  1 mg every 4 hour as needed needed catatonia-started in ED   -- The risks/benefits/side-effects/alternatives to this medication were discussed in detail with the patient and time was given for questions. The patient consents to medication trial.                -- Metabolic profile and EKG monitoring obtained while on an atypical antipsychotic (BMI: Lipid Panel: HbgA1c: QTc:)              -- Encouraged patient to participate in unit milieu and in scheduled group therapies                            3. Medical Issues Being Addressed:  No urgent medical needs identified   4. Discharge Planning:              -- Social work and case management to assist with discharge planning and identification of hospital follow-up needs prior to discharge             -- Estimated LOS: 5-7 days             -- Discharge Concerns: Need to establish a safety plan; Medication compliance and effectiveness             -- Discharge Goals: Return home with outpatient referrals follow ups   Physician Treatment Plan for Primary Diagnosis: MDD (major depressive disorder) Long Term Goal(s): Improvement in symptoms so as ready for discharge   Short Term Goals: Ability to identify changes in lifestyle to reduce recurrence of condition will improve, Ability to verbalize  feelings will improve, Ability to disclose and discuss suicidal ideas, Ability to demonstrate self-control will improve, and Ability to identify and develop effective coping behaviors will improve   Physician Treatment Plan for Secondary Diagnosis: Principal Problem:   MDD (major depressive disorder)   Long Term Goal(s): Improvement in symptoms so as ready for discharge   Short Term Goals: Ability to identify changes in lifestyle to reduce recurrence of condition will improve,  Ability to verbalize feelings will improve, Ability to disclose and discuss suicidal ideas, Ability to demonstrate self-control will improve, Ability to identify and develop effective coping behaviors will improve, Ability to maintain clinical measurements within normal limits will improve, and Compliance with prescribed medications will improve  Aurelia Blotter, MD 02/06/2024, 10:01 PM

## 2024-02-06 NOTE — Progress Notes (Signed)
   02/06/24 2005  Psych Admission Type (Psych Patients Only)  Admission Status Voluntary  Psychosocial Assessment  Patient Complaints Depression  Eye Contact Brief  Facial Expression Flat  Affect Flat  Speech Slow;Logical/coherent  Interaction Minimal  Motor Activity Slow  Appearance/Hygiene In scrubs  Behavior Characteristics Cooperative  Mood Depressed  Thought Process  Coherency WDL  Content WDL  Delusions None reported or observed  Perception WDL  Hallucination None reported or observed  Judgment Limited  Confusion Mild  Danger to Self  Current suicidal ideation? Denies  Agreement Not to Harm Self Yes  Description of Agreement verbal  Danger to Others  Danger to Others None reported or observed   Progress note   D: Pt seen in dayroom sitting at table with peers but not interacting with them. Pt denies SI, HI, AVH. Pt rates pain  0/10. Pt rates anxiety  0/10 and endorses some depression. Pt is alert and oriented. Doesn't remember a lot of what brought him to the hospital. States he does not remember having a seizure on the side of the road and has no history of a seizure disorder. Endorses trouble sleeping at night. No other concerns noted at this time.  A: Pt provided support and encouragement. Pt given scheduled medication as prescribed. PRNs as appropriate. Q15 min checks for safety.   R: Pt safe on the unit. Will continue to monitor.

## 2024-02-06 NOTE — Group Note (Signed)
 Recreation Therapy Group Note   Group Topic:General Recreation  Group Date: 02/06/2024 Start Time: 1500 End Time: 1600 Facilitators: Deatrice Factor, LRT, CTRS Location: Courtyard  Group Description: Outdoor Recreation. Patients had the option to play corn hole, ring toss, bowling or listening to music while outside in the courtyard getting fresh air and sunlight. LRT and patients discussed things that they enjoy doing in their free time outside of the hospital. LRT encouraged patients to drink water after being active and getting their heart rate up.   Goal Area(s) Addressed: Patient will identify leisure interests.  Patient will practice healthy decision making. Patient will engage in recreation activity.   Affect/Mood: Appropriate   Participation Level: Non-verbal    Clinical Observations/Individualized Feedback: Rodd was present duration of group, however, did not interact with LRT or peers.   Plan: Continue to engage patient in RT group sessions 2-3x/week.   Deatrice Factor, LRT, CTRS 02/06/2024 4:50 PM

## 2024-02-06 NOTE — Group Note (Signed)
 Date:  02/06/2024 Time:  8:45 PM  Group Topic/Focus:  Wrap-Up Group:   The focus of this group is to help patients review their daily goal of treatment and discuss progress on daily workbooks.    Participation Level:  Active  Participation Quality:  Appropriate and Attentive  Affect:  Appropriate  Cognitive:  Alert and Appropriate  Insight: Appropriate and Good  Engagement in Group:  Engaged and Improving  Modes of Intervention:  Discussion, Rapport Building, Socialization, and Support  Additional Comments:     Davontae Prusinski 02/06/2024, 8:45 PM

## 2024-02-07 DIAGNOSIS — F331 Major depressive disorder, recurrent, moderate: Secondary | ICD-10-CM | POA: Diagnosis not present

## 2024-02-07 LAB — LIPID PANEL
Cholesterol: 121 mg/dL (ref 0–200)
HDL: 39 mg/dL — ABNORMAL LOW (ref >40)
LDL Cholesterol: 69 mg/dL (ref 0–99)
Total CHOL/HDL Ratio: 3.1 ratio
Triglycerides: 64 mg/dL (ref <150)
VLDL: 13 mg/dL (ref 0–40)

## 2024-02-07 LAB — HEMOGLOBIN A1C
Hgb A1c MFr Bld: 6 % — ABNORMAL HIGH (ref 4.8–5.6)
Mean Plasma Glucose: 125.5 mg/dL

## 2024-02-07 MED ORDER — OLANZAPINE 5 MG PO TABS
2.5000 mg | ORAL_TABLET | Freq: Every morning | ORAL | Status: DC
  Administered 2024-02-07 – 2024-02-23 (×16): 2.5 mg via ORAL
  Filled 2024-02-07 (×18): qty 1

## 2024-02-07 MED ORDER — OLANZAPINE 5 MG PO TABS
5.0000 mg | ORAL_TABLET | Freq: Every day | ORAL | Status: DC
  Administered 2024-02-07 – 2024-02-11 (×5): 5 mg via ORAL
  Filled 2024-02-07 (×5): qty 1

## 2024-02-07 NOTE — Group Note (Signed)
 Physical/Occupational Therapy Group Note  Group Topic: Functional, Dynamic Balance   Group Date: 02/07/2024 Start Time: 1300 End Time: 1345 Facilitators: Valdene Garret, PT   Group Description: Group discussed impact of balance on safety and independence with functional tasks.  Identified and discussed any self-perceived balance deficits to personalize information.  Discussed and reviewed strategies to address/improve balance deficits: use of assist devices, activity pacing/energy conservation, environment/home safety modifications, focusing attention/minimizing distraction.  Reviewed and participated with standing LE therex designed to target dynamic balance reactions and LE strength/stability; provided handouts with HEP to be utilized outside of group time as appropriate.  Allowed time for questions and further discussion on any balance or mobility concerns/needs.  Therapeutic Goal(s):  Identify and discuss any individual balance deficits and functional implications. Identify and discuss any environmental/home safety modifications that can optimize balance and safety for mobility within the home. Demonstrate understanding and performance of standing therex designed to target dynamic balance deficits.  Individual Participation:  Participated with standing LE therex (for strength and balance) with bilat UE support on table top as necessary.  Fair/good stability in static and dynamic stance.  Fair/good use of compensatory strategies; fair/good use of self-initiated rest periods as needed.  Active participate with social interaction and guided conversation/education throughout session, though did require min cuing to initiate. Able to identify "walking and climbing" stairs as ways he could incorporate activity into his home environment, daily routine upon discharge   Participation Level: Active   Participation Quality: Minimal Cues   Behavior: Appropriate and Reserved   Speech/Thought  Process: Organized   Affect/Mood: Flat and Happy   Insight: Fair   Judgement: Fair   Modes of Intervention: Activity, Discussion, and Education  Patient Response to Interventions:  Attentive   Plan: Continue to engage patient in PT/OT groups 1 - 2x/week.  Brenen Beigel H. Bevin Bucks, PT, DPT, NCS 02/07/24, 4:35 PM [phone]

## 2024-02-07 NOTE — Plan of Care (Signed)
   Problem: Education: Goal: Emotional status will improve Outcome: Progressing Goal: Mental status will improve Outcome: Progressing

## 2024-02-07 NOTE — [facility] Counselor (Signed)
 CSW contacted [facility] [phone]), where pt has been reportedly staying. According to notes, it is reported that pt may not be able to return.  CSW spoke to [facility] who reports that pt was medically discharged from the program.   CSW will inform pt.   Derrill Flirt, MSW, [place] 02/07/2024 1:08 PM

## 2024-02-07 NOTE — Group Note (Signed)
 Date:  02/07/2024 Time:  10:11 AM  Group Topic/Focus:  Self Care:   The focus of this group is to help patients understand the importance of self-care in order to improve or restore emotional, physical, spiritual, interpersonal, and financial health.    Participation Level:  Active  Participation Quality:  Appropriate  Affect:  Appropriate  Cognitive:  Appropriate  Insight: Appropriate  Engagement in Group:  Engaged  Modes of Intervention:  Exploration  Additional Comments:  Pt attended group  Kamariya Blevens L Denym Rahimi 02/07/2024, 10:11 AM

## 2024-02-07 NOTE — Progress Notes (Signed)
 [facility] MD Progress Note  02/07/2024 4:34 PM NEPHTALI HIN  MRN:  [id number]  Caleb Leblanc is a 63 y.o. male admitted: Presented to the ED on 01/31/2024  5:11 PM for altered mental status. He carries the psychiatric diagnoses of depression and has a past medical history of catatonia, OSA, constipation. His current presentation of despondence, hopeless, suicidal thoughts, flat affect is most consistent with depression.   Subjective:  Chart reviewed, case discussed in multidisciplinary meeting, patient seen during rounds.  Patient is noted to be sitting in the day area.  He reports that he is not doing well and reports that the hallucinations are loud and mumbling different things.  Provider discussed the plan to increase his nighttime Zyprexa  to 5 mg starting today.  He denies visual hallucinations.  He continues to endorse depression and hopelessness because of the auditory hallucinations.  He denies suicidal ideation/homicidal ideation.  He is taking his medications and denies any side effects including EPS.  Per nursing report patient displays thought blocking, flat affect and poor eye contact.    Sleep: Fair  Appetite:  Fair  Past Psychiatric History: see h&P Family History: History reviewed. No pertinent family history. Social History:  Social History   Substance and Sexual Activity  Alcohol  Use Not Currently     Social History   Substance and Sexual Activity  Drug Use Never    Social History   Socioeconomic History   Marital status: Widowed    Spouse name: Not on file   Number of children: Not on file   Years of education: Not on file   Highest education level: Not on file  Occupational History   Not on file  Tobacco Use   Smoking status: Former    Current packs/day: 0.00    Types: Cigarettes    Quit date: 09/09/2017    Years since quitting: 6.4   Smokeless tobacco: Never   Tobacco comments:    pt states he smoked 4 cigs/day before he quit  Vaping Use   Vaping  status: Never Used  Substance and Sexual Activity   Alcohol  use: Not Currently   Drug use: Never   Sexual activity: Not on file  Other Topics Concern   Not on file  Social History Narrative   Not on file   Social Drivers of Health   Financial Resource Strain: Not on file  Food Insecurity: Food Insecurity Present (02/04/2024)   Hunger Vital Sign    Worried About Running Out of Food in the Last Year: Often true    Ran Out of Food in the Last Year: Often true  Transportation Needs: Unmet Transportation Needs (02/04/2024)   PRAPARE - Administrator, Civil Service (Medical): Yes    Lack of Transportation (Non-Medical): Yes  Physical Activity: Not on file  Stress: Not on file  Social Connections: Socially Isolated (01/18/2024)   Social Connection and Isolation Panel [NHANES]    Frequency of Communication with Friends and Family: Never    Frequency of Social Gatherings with Friends and Family: Never    Attends Religious Services: Never    Database administrator or Organizations: No    Attends Engineer, structural: Never    Marital Status: Never married   Past Medical History:  Past Medical History:  Diagnosis Date   Hypertension     Past Surgical History:  Procedure Laterality Date   ABDOMINAL SURGERY      Current Medications: Current Facility-Administered Medications  Medication  Dose Route Frequency Provider Last Rate Last Admin   acetaminophen  (TYLENOL ) tablet 650 mg  650 mg Oral Q6H PRN Motley-Mangrum, Jadeka A, PMHNP       alum & mag hydroxide-simeth (MAALOX/MYLANTA) 200-200-20 MG/5ML suspension 30 mL  30 mL Oral Q4H PRN Motley-Mangrum, Jadeka A, PMHNP       amLODipine  (NORVASC ) tablet 10 mg  10 mg Oral Daily Motley-Mangrum, Jadeka A, PMHNP   10 mg at 02/07/24 1103   haloperidol  (HALDOL ) tablet 5 mg  5 mg Oral TID PRN Motley-Mangrum, Jadeka A, PMHNP       And   diphenhydrAMINE (BENADRYL) capsule 50 mg  50 mg Oral TID PRN Motley-Mangrum, Jadeka A, PMHNP        haloperidol  lactate (HALDOL ) injection 5 mg  5 mg Intramuscular TID PRN Motley-Mangrum, Jadeka A, PMHNP       And   diphenhydrAMINE (BENADRYL) injection 50 mg  50 mg Intramuscular TID PRN Motley-Mangrum, Jadeka A, PMHNP       And   LORazepam  (ATIVAN ) injection 2 mg  2 mg Intramuscular TID PRN Motley-Mangrum, Jadeka A, PMHNP       haloperidol  lactate (HALDOL ) injection 10 mg  10 mg Intramuscular TID PRN Motley-Mangrum, Jadeka A, PMHNP       And   diphenhydrAMINE (BENADRYL) injection 50 mg  50 mg Intramuscular TID PRN Motley-Mangrum, Jadeka A, PMHNP       And   LORazepam  (ATIVAN ) injection 2 mg  2 mg Intramuscular TID PRN Motley-Mangrum, Jadeka A, PMHNP       escitalopram (LEXAPRO) tablet 10 mg  10 mg Oral Daily Ilham Roughton, MD   10 mg at 02/07/24 1103   hydrOXYzine (ATARAX) tablet 25 mg  25 mg Oral TID PRN Motley-Mangrum, Jadeka A, PMHNP       influenza vac split trivalent PF (FLULAVAL) injection 0.5 mL  0.5 mL Intramuscular Tomorrow-1000 Aurelia Blotter, MD       LORazepam  (ATIVAN ) tablet 1 mg  1 mg Oral Q4H PRN Motley-Mangrum, Jadeka A, PMHNP       magnesium hydroxide (MILK OF MAGNESIA) suspension 30 mL  30 mL Oral Daily PRN Motley-Mangrum, Jadeka A, PMHNP       OLANZapine  (ZYPREXA ) tablet 2.5 mg  2.5 mg Oral q AM Ahmoni Edge, MD   2.5 mg at 02/07/24 1105   OLANZapine  (ZYPREXA ) tablet 5 mg  5 mg Oral QHS Danique Hartsough, MD       traZODone (DESYREL) tablet 50 mg  50 mg Oral QHS PRN Motley-Mangrum, Jadeka A, PMHNP   50 mg at 02/06/24 2119    Lab Results:  Results for orders placed or performed during the hospital encounter of 02/04/24 (from the past 48 hours)  Hemoglobin A1c     Status: Abnormal   Collection Time: 02/07/24  7:30 AM  Result Value Ref Range   Hgb A1c MFr Bld 6.0 (H) 4.8 - 5.6 %    Comment: (NOTE) Pre diabetes:          5.7%-6.4%  Diabetes:              >6.4%  Glycemic control for   <7.0% adults with diabetes    Mean Plasma Glucose 125.5 mg/dL     Comment: Performed at [facility], [address] [place], [place] [address]  Lipid panel     Status: Abnormal   Collection Time: 02/07/24  7:30 AM  Result Value Ref Range   Cholesterol 121 0 - 200 mg/dL   Triglycerides  64 <150 mg/dL   HDL 39 (L) >16 mg/dL   Total CHOL/HDL Ratio 3.1 RATIO   VLDL 13 0 - 40 mg/dL   LDL Cholesterol 69 0 - 99 mg/dL    Comment:        Total Cholesterol/HDL:CHD Risk Coronary Heart Disease Risk Table                     Men   Women  1/2 Average Risk   3.4   3.3  Average Risk       5.0   4.4  2 X Average Risk   9.6   7.1  3 X Average Risk  23.4   11.0        Use the calculated Patient Ratio above and the CHD Risk Table to determine the patient's CHD Risk.        ATP III CLASSIFICATION (LDL):  <100     mg/dL   Optimal  109-604  mg/dL   Near or Above                    Optimal  130-159  mg/dL   Borderline  540-981  mg/dL   High  >191     mg/dL   Very High Performed at [facility], [address] [place], [place] [address]     Blood Alcohol  level:  Lab Results  Component Value Date   [facility] <15 01/31/2024   ETH <10 01/29/2024    Metabolic Disorder Labs: Lab Results  Component Value Date   HGBA1C 6.0 (H) 02/07/2024   MPG 125.5 02/07/2024   No results found for: "PROLACTIN" Lab Results  Component Value Date   CHOL 121 02/07/2024   TRIG 64 02/07/2024   HDL 39 (L) 02/07/2024   CHOLHDL 3.1 02/07/2024   VLDL 13 02/07/2024   LDLCALC 69 02/07/2024    Physical Findings: AIMS:  , ,  ,  ,    CIWA:    COWS:      Psychiatric Specialty Exam:  Presentation  General Appearance:  Appropriate for Environment; Casual  Eye Contact: Fair  Speech: Normal Rate  Speech Volume: Decreased    Mood and Affect  Mood: Anxious; Depressed  Affect: Depressed; Flat   Thought Process  Thought Processes: Disorganized  Descriptions of Associations:Loose  Orientation:Partial  Thought  Content:Illogical  Hallucinations: reports lot of voices mumbling  Ideas of Reference:None  Suicidal Thoughts: denies  Homicidal Thoughts: denies   Sensorium  Memory: Immediate Fair; Recent Poor; Remote Poor  Judgment: Impaired  Insight: Shallow   Executive Functions  Concentration: Poor  Attention Span: Poor  Recall: Poor  Fund of Knowledge: Poor  Language: Poor   Psychomotor Activity  Psychomotor Activity: reduced  Musculoskeletal: Strength & Muscle Tone: within normal limits Gait & Station: normal Assets  Assets: Desire for Improvement; Resilience    Physical Exam: Physical Exam Vitals and nursing note reviewed.    ROS Blood pressure 120/70, pulse 83, temperature 98 F (36.7 C), resp. rate 18, height 5\' 10"  (1.778 m), weight 117.9 kg, SpO2 97%. Body mass index is 37.31 kg/m.  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  Patient is paralyzed for further safety evaluation and stabilization   Treatment Plan Summary:   Safety and Monitoring:             --  Voluntary admission to inpatient psychiatric unit for safety, stabilization and treatment             -- Daily contact with patient to assess and evaluate symptoms and progress in treatment             -- Patient's case to be discussed in multi-disciplinary team meeting             -- Observation Level: q15 minute checks             -- Vital signs:  q12 hours             -- Precautions: suicide, elopement, and assault   2. Psychiatric Diagnoses and Treatment:               Lexapro 10 mg daily Increased zyprexa  to 2.5 mg QAM and 5 mg QHS Ativan  1 mg every 4 hour as needed needed catatonia-started in ED   -- The risks/benefits/side-effects/alternatives to  this medication were discussed in detail with the patient and time was given for questions. The patient consents to medication trial.                -- Metabolic profile and EKG monitoring obtained while on an atypical antipsychotic (BMI: Lipid Panel: HbgA1c: QTc:)              -- Encouraged patient to participate in unit milieu and in scheduled group therapies                            3. Medical Issues Being Addressed:  No urgent medical needs identified   4. Discharge Planning:              -- Social work and case management to assist with discharge planning and identification of hospital follow-up needs prior to discharge             -- Estimated LOS: 5-7 days             -- Discharge Concerns: Need to establish a safety plan; Medication compliance and effectiveness             -- Discharge Goals: Return home with outpatient referrals follow ups   Physician Treatment Plan for Primary Diagnosis: MDD (major depressive disorder) Long Term Goal(s): Improvement in symptoms so as ready for discharge   Short Term Goals: Ability to identify changes in lifestyle to reduce recurrence of condition will improve, Ability to verbalize feelings will improve, Ability to disclose and discuss suicidal ideas, Ability to demonstrate self-control will improve, and Ability to identify and develop effective coping behaviors will improve   Physician Treatment Plan for Secondary Diagnosis: Principal Problem:   MDD (major depressive disorder)   Long Term Goal(s): Improvement in symptoms so as ready for discharge   Short Term Goals: Ability to identify changes in lifestyle to reduce recurrence of condition will improve, Ability to verbalize feelings will improve, Ability to disclose and discuss suicidal ideas, Ability to demonstrate self-control will improve, Ability to identify and develop effective coping behaviors will improve, Ability to maintain clinical measurements within normal limits will improve, and  Compliance with prescribed medications will improve  Aurelia Blotter, MD 02/07/2024, 4:34 PM

## 2024-02-07 NOTE — Progress Notes (Signed)
   02/07/24 1300  Psych Admission Type (Psych Patients Only)  Admission Status Voluntary  Psychosocial Assessment  Patient Complaints Depression  Eye Contact Brief  Facial Expression Flat  Affect Flat  Speech Logical/coherent  Interaction No initiation  Motor Activity Slow  Appearance/Hygiene In scrubs  Behavior Characteristics Cooperative  Mood Depressed  Thought Process  Coherency WDL  Content WDL  Delusions None reported or observed  Perception WDL  Hallucination None reported or observed  Judgment Impaired  Confusion Mild  Danger to Self  Current suicidal ideation? Denies  Agreement Not to Harm Self Yes  Description of Agreement verbal  Danger to Others  Danger to Others None reported or observed

## 2024-02-07 NOTE — Plan of Care (Signed)
  Problem: Education: Goal: Emotional status will improve Outcome: Progressing Goal: Mental status will improve Outcome: Progressing Goal: Verbalization of understanding the information provided will improve Outcome: Progressing   Problem: Activity: Goal: Sleeping patterns will improve Outcome: Progressing   Problem: Physical Regulation: Goal: Ability to maintain clinical measurements within normal limits will improve Outcome: Progressing   Problem: Safety: Goal: Periods of time without injury will increase Outcome: Progressing

## 2024-02-07 NOTE — Group Note (Signed)
 Date:  02/07/2024 Time:  5:10 PM  Group Topic/Focus:  Activity Group: The focus of the group is to promote activity for the patients and encourage them to go outside in the courtyard and get some fresh air and some exercise.    Participation Level:  Active  Participation Quality:  Appropriate  Affect:  Appropriate  Cognitive:  Appropriate  Insight: Appropriate  Engagement in Group:  Engaged  Modes of Intervention:  Activity  Additional Comments:    Caleb Leblanc 02/07/2024, 5:10 PM

## 2024-02-07 NOTE — Progress Notes (Signed)
   02/07/24 1955  Psych Admission Type (Psych Patients Only)  Admission Status Voluntary  Psychosocial Assessment  Patient Complaints Anxiety;Depression  Eye Contact Fair  Facial Expression Flat  Affect Flat  Speech Slow;Logical/coherent  Interaction Minimal  Motor Activity Slow  Appearance/Hygiene In scrubs  Behavior Characteristics Cooperative;Anxious  Mood Depressed;Pleasant  Thought Process  Coherency WDL  Content WDL  Delusions None reported or observed  Perception WDL  Hallucination Auditory;Visual (not specific about what he sees or hears)  Judgment Impaired  Confusion Mild  Danger to Self  Current suicidal ideation? Denies  Danger to Others  Danger to Others None reported or observed   Progress note   D: Pt seen in dayroom. Will talk to other patients if they initiate the conversation. Pt denies SI, HI. States that he has experienced AVH but is not specific about what he is seeing and hearing. Says that sometimes he experiences paranoia. Pt rates pain  0/10. Pt endorses a small amount of anxiety and depression. Attended groups today. Liked the meditation. Reports good appetite and sleep. No other concerns noted at this time.  A: Pt provided support and encouragement. Pt given scheduled medication as prescribed. PRNs as appropriate. Q15 min checks for safety.   R: Pt safe on the unit. Will continue to monitor.

## 2024-02-08 DIAGNOSIS — F331 Major depressive disorder, recurrent, moderate: Secondary | ICD-10-CM | POA: Diagnosis not present

## 2024-02-08 NOTE — Plan of Care (Signed)
   Problem: Coping: Goal: Ability to verbalize frustrations and anger appropriately will improve Outcome: Progressing Goal: Ability to demonstrate self-control will improve Outcome: Progressing

## 2024-02-08 NOTE — Group Note (Signed)
 Date:  02/08/2024 Time:  9:37 PM  Group Topic/Focus:  Goals Group:   The focus of this group is to help patients establish daily goals to achieve during treatment and discuss how the patient can incorporate goal setting into their daily lives to aide in recovery.    Participation Level:  Active  Participation Quality:  Appropriate and Attentive  Affect:  Appropriate  Cognitive:  Alert and Appropriate  Insight: Appropriate  Engagement in Group:  Engaged and Improving  Modes of Intervention:  Discussion, Education, Problem-solving, Rapport Building, and Support  Additional Comments:     Youlanda Tomassetti 02/08/2024, 9:37 PM

## 2024-02-08 NOTE — Group Note (Signed)
 Date:  02/08/2024 Time:  5:06 PM  Group Topic/Focus:  Goals Group:   The focus of this group is to help patients establish daily goals to achieve during treatment and discuss how the patient can incorporate goal setting into their daily lives to aide in recovery.    Participation Level:  Did Not Attend   Ellan Gunner 02/08/2024, 5:06 PM

## 2024-02-08 NOTE — Progress Notes (Signed)
 Patient admitted February 04, 2024 to [facility] for medication non adherence and altered mental status.  Patient endorses depression and auditory/visual hallucinations. Patient reports seeing shadows and hearing chatter. He denies SI/HI. Engaged in active listening. Patient minimally participated in 2/3 groups and was less isolative today than yesterday.  Discharge planning ongoing. Q15 minute unit checks in place.

## 2024-02-08 NOTE — Progress Notes (Signed)
   02/08/24 2000  Psych Admission Type (Psych Patients Only)  Admission Status Voluntary  Psychosocial Assessment  Patient Complaints Depression;Other (Comment) (tiredness)  Eye Contact Brief  Facial Expression Flat  Affect Flat;Depressed  Speech Logical/coherent;Slow (delayed responses)  Interaction No initiation  Motor Activity Slow  Appearance/Hygiene In scrubs  Behavior Characteristics Cooperative  Mood Depressed  Thought Process  Coherency WDL  Content WDL  Delusions None reported or observed  Perception WDL  Hallucination Auditory;Visual (seeing shadows and hearing chatter)  Judgment Impaired  Confusion Mild  Danger to Self  Current suicidal ideation? Denies  Danger to Others  Danger to Others None reported or observed

## 2024-02-08 NOTE — Progress Notes (Signed)
 [facility] MD Progress Note  02/08/2024 8:44 PM Caleb Leblanc  MRN:  [id number]  Caleb Leblanc is a 63 y.o. male admitted: Presented to the ED on 01/31/2024  5:11 PM for altered mental status. He carries the psychiatric diagnoses of depression and has a past medical history of catatonia, OSA, constipation. His current presentation of despondence, hopeless, suicidal thoughts, flat affect is most consistent with depression.   Subjective:  Chart reviewed, case discussed in multidisciplinary meeting, patient seen during rounds.   Today patient is noted to be resting in his bed.  He reports that he feels drained out and is unable to even make it to the bathroom and he had an accident today morning on his bed.  Patient discussed with increased dose of Zyprexa  at night and patient reports that the voices are reducing with the increased dose.  He continues to endorse feeling depressed and.  He denies SI/HI/plan.  Sleep: Fair  Appetite:  Fair  Past Psychiatric History: see h&P Family History: History reviewed. No pertinent family history. Social History:  Social History   Substance and Sexual Activity  Alcohol  Use Not Currently     Social History   Substance and Sexual Activity  Drug Use Never    Social History   Socioeconomic History   Marital status: Widowed    Spouse name: Not on file   Number of children: Not on file   Years of education: Not on file   Highest education level: Not on file  Occupational History   Not on file  Tobacco Use   Smoking status: Former    Current packs/day: 0.00    Types: Cigarettes    Quit date: 09/09/2017    Years since quitting: 6.4   Smokeless tobacco: Never   Tobacco comments:    pt states he smoked 4 cigs/day before he quit  Vaping Use   Vaping status: Never Used  Substance and Sexual Activity   Alcohol  use: Not Currently   Drug use: Never   Sexual activity: Not on file  Other Topics Concern   Not on file  Social History Narrative   Not on  file   Social Drivers of Health   Financial Resource Strain: Not on file  Food Insecurity: Food Insecurity Present (02/04/2024)   Hunger Vital Sign    Worried About Running Out of Food in the Last Year: Often true    Ran Out of Food in the Last Year: Often true  Transportation Needs: Unmet Transportation Needs (02/04/2024)   PRAPARE - Administrator, Civil Service (Medical): Yes    Lack of Transportation (Non-Medical): Yes  Physical Activity: Not on file  Stress: Not on file  Social Connections: Socially Isolated (01/18/2024)   Social Connection and Isolation Panel [NHANES]    Frequency of Communication with Friends and Family: Never    Frequency of Social Gatherings with Friends and Family: Never    Attends Religious Services: Never    Database administrator or Organizations: No    Attends Engineer, structural: Never    Marital Status: Never married   Past Medical History:  Past Medical History:  Diagnosis Date   Hypertension     Past Surgical History:  Procedure Laterality Date   ABDOMINAL SURGERY      Current Medications: Current Facility-Administered Medications  Medication Dose Route Frequency Provider Last Rate Last Admin   acetaminophen  (TYLENOL ) tablet 650 mg  650 mg Oral Q6H PRN Motley-Mangrum, Jadeka A, PMHNP  alum & mag hydroxide-simeth (MAALOX/MYLANTA) 200-200-20 MG/5ML suspension 30 mL  30 mL Oral Q4H PRN Motley-Mangrum, Jadeka A, PMHNP       amLODipine  (NORVASC ) tablet 10 mg  10 mg Oral Daily Motley-Mangrum, Jadeka A, PMHNP   10 mg at 02/08/24 1049   haloperidol  (HALDOL ) tablet 5 mg  5 mg Oral TID PRN Motley-Mangrum, Jadeka A, PMHNP       And   diphenhydrAMINE  (BENADRYL ) capsule 50 mg  50 mg Oral TID PRN Motley-Mangrum, Jadeka A, PMHNP       haloperidol  lactate (HALDOL ) injection 5 mg  5 mg Intramuscular TID PRN Motley-Mangrum, Jadeka A, PMHNP       And   diphenhydrAMINE  (BENADRYL ) injection 50 mg  50 mg Intramuscular TID PRN  Motley-Mangrum, Jadeka A, PMHNP       And   LORazepam  (ATIVAN ) injection 2 mg  2 mg Intramuscular TID PRN Motley-Mangrum, Jadeka A, PMHNP       haloperidol  lactate (HALDOL ) injection 10 mg  10 mg Intramuscular TID PRN Motley-Mangrum, Jadeka A, PMHNP       And   diphenhydrAMINE  (BENADRYL ) injection 50 mg  50 mg Intramuscular TID PRN Motley-Mangrum, Jadeka A, PMHNP       And   LORazepam  (ATIVAN ) injection 2 mg  2 mg Intramuscular TID PRN Motley-Mangrum, Jadeka A, PMHNP       escitalopram  (LEXAPRO ) tablet 10 mg  10 mg Oral Daily Harlee Eckroth, MD   10 mg at 02/08/24 1049   hydrOXYzine  (ATARAX ) tablet 25 mg  25 mg Oral TID PRN Motley-Mangrum, Jadeka A, PMHNP       influenza vac split trivalent PF (FLULAVAL) injection 0.5 mL  0.5 mL Intramuscular Tomorrow-1000 Aurelia Blotter, MD       LORazepam  (ATIVAN ) tablet 1 mg  1 mg Oral Q4H PRN Motley-Mangrum, Jadeka A, PMHNP       magnesium  hydroxide (MILK OF MAGNESIA) suspension 30 mL  30 mL Oral Daily PRN Motley-Mangrum, Jadeka A, PMHNP       OLANZapine  (ZYPREXA ) tablet 2.5 mg  2.5 mg Oral q AM Aymee Fomby, MD   2.5 mg at 02/08/24 1049   OLANZapine  (ZYPREXA ) tablet 5 mg  5 mg Oral QHS Ishi Danser, MD   5 mg at 02/07/24 2059   traZODone  (DESYREL ) tablet 50 mg  50 mg Oral QHS PRN Motley-Mangrum, Jadeka A, PMHNP   50 mg at 02/07/24 2059    Lab Results:  Results for orders placed or performed during the hospital encounter of 02/04/24 (from the past 48 hours)  Hemoglobin A1c     Status: Abnormal   Collection Time: 02/07/24  7:30 AM  Result Value Ref Range   Hgb A1c MFr Bld 6.0 (H) 4.8 - 5.6 %    Comment: (NOTE) Pre diabetes:          5.7%-6.4%  Diabetes:              >6.4%  Glycemic control for   <7.0% adults with diabetes    Mean Plasma Glucose 125.5 mg/dL    Comment: Performed at [facility], [address] [place], [place] [address]  Lipid panel     Status: Abnormal   Collection Time: 02/07/24  7:30 AM  Result Value Ref Range    Cholesterol 121 0 - 200 mg/dL   Triglycerides 64 <604 mg/dL   HDL 39 (L) >54 mg/dL   Total CHOL/HDL Ratio 3.1 RATIO   VLDL 13 0 - 40 mg/dL   LDL  Cholesterol 69 0 - 99 mg/dL    Comment:        Total Cholesterol/HDL:CHD Risk Coronary Heart Disease Risk Table                     Men   Women  1/2 Average Risk   3.4   3.3  Average Risk       5.0   4.4  2 X Average Risk   9.6   7.1  3 X Average Risk  23.4   11.0        Use the calculated Patient Ratio above and the CHD Risk Table to determine the patient's CHD Risk.        ATP III CLASSIFICATION (LDL):  <100     mg/dL   Optimal  161-096  mg/dL   Near or Above                    Optimal  130-159  mg/dL   Borderline  045-409  mg/dL   High  >811     mg/dL   Very High Performed at [facility], [address] [place], [place] [address]     Blood Alcohol  level:  Lab Results  Component Value Date   [facility] <15 01/31/2024   ETH <10 01/29/2024    Metabolic Disorder Labs: Lab Results  Component Value Date   HGBA1C 6.0 (H) 02/07/2024   MPG 125.5 02/07/2024   No results found for: "PROLACTIN" Lab Results  Component Value Date   CHOL 121 02/07/2024   TRIG 64 02/07/2024   HDL 39 (L) 02/07/2024   CHOLHDL 3.1 02/07/2024   VLDL 13 02/07/2024   LDLCALC 69 02/07/2024    Physical Findings: AIMS:  , ,  ,  ,    CIWA:    COWS:      Psychiatric Specialty Exam:  Presentation  General Appearance:  Appropriate for Environment; Casual  Eye Contact: Fair  Speech: Normal Rate  Speech Volume: Decreased    Mood and Affect  Mood: Anxious; Depressed  Affect: Depressed; Flat   Thought Process  Thought Processes: Disorganized  Descriptions of Associations:Loose  Orientation:Partial  Thought Content:Illogical  Hallucinations: reports lot of voices mumbling  Ideas of Reference:None  Suicidal Thoughts: denies  Homicidal Thoughts: denies   Sensorium  Memory: Immediate Fair; Recent Poor; Remote  Poor  Judgment: Impaired  Insight: Shallow   Executive Functions  Concentration: Poor  Attention Span: Poor  Recall: Poor  Fund of Knowledge: Poor  Language: Poor   Psychomotor Activity  Psychomotor Activity: reduced  Musculoskeletal: Strength & Muscle Tone: within normal limits Gait & Station: normal Assets  Assets: Desire for Improvement; Resilience    Physical Exam: Physical Exam Vitals and nursing note reviewed.    ROS Blood pressure 135/88, pulse 77, temperature 98.1 F (36.7 C), resp. rate (!) 24, height 5\' 10"  (1.778 m), weight 117.9 kg, SpO2 99%. Body mass index is 37.31 kg/m.  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  Patient is paralyzed for further safety evaluation and stabilization   Treatment Plan Summary:   Safety and Monitoring:             -- Voluntary admission to inpatient psychiatric unit for safety, stabilization and treatment             --  Daily contact with patient to assess and evaluate symptoms and progress in treatment             -- Patient's case to be discussed in multi-disciplinary team meeting             -- Observation Level: q15 minute checks             -- Vital signs:  q12 hours             -- Precautions: suicide, elopement, and assault   2. Psychiatric Diagnoses and Treatment:               Lexapro  10 mg daily Increased zyprexa  to 2.5 mg QAM and 5 mg QHS Ativan  1 mg every 4 hour as needed needed catatonia-started in ED   -- The risks/benefits/side-effects/alternatives to this medication were discussed in detail with the patient and time was given for questions. The patient consents to medication trial.                -- Metabolic profile and EKG monitoring obtained while on  an atypical antipsychotic (BMI: Lipid Panel: HbgA1c: QTc:)              -- Encouraged patient to participate in unit milieu and in scheduled group therapies                            3. Medical Issues Being Addressed:  No urgent medical needs identified   4. Discharge Planning:              -- Social work and case management to assist with discharge planning and identification of hospital follow-up needs prior to discharge             -- Estimated LOS: 5-7 days             -- Discharge Concerns: Need to establish a safety plan; Medication compliance and effectiveness             -- Discharge Goals: Return home with outpatient referrals follow ups   Physician Treatment Plan for Primary Diagnosis: MDD (major depressive disorder) Long Term Goal(s): Improvement in symptoms so as ready for discharge   Short Term Goals: Ability to identify changes in lifestyle to reduce recurrence of condition will improve, Ability to verbalize feelings will improve, Ability to disclose and discuss suicidal ideas, Ability to demonstrate self-control will improve, and Ability to identify and develop effective coping behaviors will improve   Physician Treatment Plan for Secondary Diagnosis: Principal Problem:   MDD (major depressive disorder)   Long Term Goal(s): Improvement in symptoms so as ready for discharge   Short Term Goals: Ability to identify changes in lifestyle to reduce recurrence of condition will improve, Ability to verbalize feelings will improve, Ability to disclose and discuss suicidal ideas, Ability to demonstrate self-control will improve, Ability to identify and develop effective coping behaviors will improve, Ability to maintain clinical measurements within normal limits will improve, and Compliance with prescribed medications will improve  Aurelia Blotter, MD 02/08/2024, 8:44 PM

## 2024-02-08 NOTE — Group Note (Signed)
 Recreation Therapy Group Note   Group Topic:Health and Wellness  Group Date: 02/08/2024 Start Time: 1500 End Time: 1600 Facilitators: Deatrice Factor, LRT, CTRS Location: Courtyard  Group Description: Outdoor Recreation. Patients had the option to play corn hole, ring toss, bowling or listening to music while outside in the courtyard getting fresh air and sunlight. Patients helped water and prune the raised garden beds. LRT and patients discussed things that they enjoy doing in their free time outside of the hospital. LRT encouraged patients to drink water after being active and getting their heart rate up.   Goal Area(s) Addressed: Patient will identify leisure interests.  Patient will practice healthy decision making. Patient will engage in recreation activity.   Affect/Mood: Flat   Participation Level: Moderate   Participation Quality: Independent   Behavior: Appropriate   Speech/Thought Process: Coherent   Insight: Fair   Judgement: Fair    Modes of Intervention: Activity   Patient Response to Interventions:  Receptive   Education Outcome:  Acknowledges education   Clinical Observations/Individualized Feedback: Leelyn was mostly active in their participation of session activities and group discussion. Pt did not interact with LRT or peers while outside. Pt kept to himself and had a flat affect.    Plan: Continue to engage patient in RT group sessions 2-3x/week.   Deatrice Factor, LRT, CTRS 02/08/2024 5:06 PM

## 2024-02-08 NOTE — Group Note (Signed)
 Recreation Therapy Group Note   Group Topic:Relaxation  Group Date: 02/08/2024 Start Time: 1100 End Time: 1140 Facilitators: Deatrice Factor, LRT, CTRS Location:  Dayroom  Group Description: PMR (Progressive Muscle Relaxation). LRT educates patients on what PMR is and the benefits that come from it. Patients are asked to sit with their feet flat on the floor while sitting up and all the way back in their chair, if possible. LRT and pts follow a prompt through a speaker that requires you to tense and release different muscles in their body and focus on their breathing. During session, lights are off and soft music is being played. Pts are given a stress ball to use if needed.   Goal Area(s) Addressed:  Patients will be able to describe progressive muscle relaxation.  Patient will practice using relaxation technique. Patient will identify a new coping skill.  Patient will follow multistep directions to reduce anxiety and stress.   Affect/Mood: Appropriate   Participation Level: Minimal    Clinical Observations/Individualized Feedback: Caleb Leblanc came to group once it was more than halfway compete. Pt completed all exercises as prompted while present.   Plan: Continue to engage patient in RT group sessions 2-3x/week.   Deatrice Factor, LRT, CTRS 02/08/2024 2:10 PM

## 2024-02-08 NOTE — Plan of Care (Signed)
  Problem: Coping: Goal: Ability to verbalize frustrations and anger appropriately will improve Outcome: Progressing Goal: Ability to demonstrate self-control will improve Outcome: Progressing   Problem: Physical Regulation: Goal: Ability to maintain clinical measurements within normal limits will improve Outcome: Progressing

## 2024-02-09 DIAGNOSIS — F331 Major depressive disorder, recurrent, moderate: Secondary | ICD-10-CM | POA: Diagnosis not present

## 2024-02-09 LAB — AMMONIA: Ammonia: 20 umol/L (ref 9–35)

## 2024-02-09 MED ORDER — LACTULOSE 10 GM/15ML PO SOLN
10.0000 g | Freq: Two times a day (BID) | ORAL | Status: DC
  Administered 2024-02-09 – 2024-02-18 (×18): 10 g via ORAL
  Filled 2024-02-09 (×18): qty 30

## 2024-02-09 NOTE — Progress Notes (Signed)
 [facility] MD Progress Note  02/09/2024 6:28 PM Caleb Leblanc  MRN:  [id number]  Caleb Leblanc is a 63 y.o. male admitted: Presented to the ED on 01/31/2024  5:11 PM for altered mental status. He carries the psychiatric diagnoses of depression and has a past medical history of catatonia, OSA, constipation. His current presentation of despondence, hopeless, suicidal thoughts, flat affect is most consistent with depression.   Subjective:  Chart reviewed, case discussed in multidisciplinary meeting, patient seen during rounds.  Today on interview patient is noted to be laying in bed with his legs down.  He reports that he is unable to get off of the bed.  Provider encouraged him to scoot back and turn to the side before he gets out of the bed.  He continues to report not feeling well but is unable to describe what exactly he is feeling.  He continues to endorse auditory hallucinations but reports that they are improving.  He reports feeling depressed and intermittent suicidal thoughts but is unable to give details.  He keeps repeating "I do not know ".  Per nursing report patient has been constipated for many days.  Discussed the plan to check ammonia levels and initiate lactulose for severe constipation.  Will also check CMP to make sure medically is doing well   Sleep: Fair  Appetite:  Fair  Past Psychiatric History: see h&P Family History: History reviewed. No pertinent family history. Social History:  Social History   Substance and Sexual Activity  Alcohol  Use Not Currently     Social History   Substance and Sexual Activity  Drug Use Never    Social History   Socioeconomic History   Marital status: Widowed    Spouse name: Not on file   Number of children: Not on file   Years of education: Not on file   Highest education level: Not on file  Occupational History   Not on file  Tobacco Use   Smoking status: Former    Current packs/day: 0.00    Types: Cigarettes    Quit date:  09/09/2017    Years since quitting: 6.4   Smokeless tobacco: Never   Tobacco comments:    pt states he smoked 4 cigs/day before he quit  Vaping Use   Vaping status: Never Used  Substance and Sexual Activity   Alcohol  use: Not Currently   Drug use: Never   Sexual activity: Not on file  Other Topics Concern   Not on file  Social History Narrative   Not on file   Social Drivers of Health   Financial Resource Strain: Not on file  Food Insecurity: Food Insecurity Present (02/04/2024)   Hunger Vital Sign    Worried About Running Out of Food in the Last Year: Often true    Ran Out of Food in the Last Year: Often true  Transportation Needs: Unmet Transportation Needs (02/04/2024)   PRAPARE - Administrator, Civil Service (Medical): Yes    Lack of Transportation (Non-Medical): Yes  Physical Activity: Not on file  Stress: Not on file  Social Connections: Socially Isolated (01/18/2024)   Social Connection and Isolation Panel [NHANES]    Frequency of Communication with Friends and Family: Never    Frequency of Social Gatherings with Friends and Family: Never    Attends Religious Services: Never    Database administrator or Organizations: No    Attends Banker Meetings: Never    Marital Status: Never married  Past Medical History:  Past Medical History:  Diagnosis Date   Hypertension     Past Surgical History:  Procedure Laterality Date   ABDOMINAL SURGERY      Current Medications: Current Facility-Administered Medications  Medication Dose Route Frequency Provider Last Rate Last Admin   acetaminophen  (TYLENOL ) tablet 650 mg  650 mg Oral Q6H PRN Motley-Mangrum, Jadeka A, PMHNP       alum & mag hydroxide-simeth (MAALOX/MYLANTA) 200-200-20 MG/5ML suspension 30 mL  30 mL Oral Q4H PRN Motley-Mangrum, Jadeka A, PMHNP       amLODipine  (NORVASC ) tablet 10 mg  10 mg Oral Daily Motley-Mangrum, Jadeka A, PMHNP   10 mg at 02/09/24 1030   haloperidol  (HALDOL ) tablet 5  mg  5 mg Oral TID PRN Motley-Mangrum, Jadeka A, PMHNP       And   diphenhydrAMINE  (BENADRYL ) capsule 50 mg  50 mg Oral TID PRN Motley-Mangrum, Jadeka A, PMHNP       haloperidol  lactate (HALDOL ) injection 5 mg  5 mg Intramuscular TID PRN Motley-Mangrum, Jadeka A, PMHNP       And   diphenhydrAMINE  (BENADRYL ) injection 50 mg  50 mg Intramuscular TID PRN Motley-Mangrum, Jadeka A, PMHNP       And   LORazepam  (ATIVAN ) injection 2 mg  2 mg Intramuscular TID PRN Motley-Mangrum, Jadeka A, PMHNP       haloperidol  lactate (HALDOL ) injection 10 mg  10 mg Intramuscular TID PRN Motley-Mangrum, Jadeka A, PMHNP       And   diphenhydrAMINE  (BENADRYL ) injection 50 mg  50 mg Intramuscular TID PRN Motley-Mangrum, Jadeka A, PMHNP       And   LORazepam  (ATIVAN ) injection 2 mg  2 mg Intramuscular TID PRN Motley-Mangrum, Jadeka A, PMHNP       escitalopram  (LEXAPRO ) tablet 10 mg  10 mg Oral Daily Rain Friedt, MD   10 mg at 02/09/24 1030   hydrOXYzine  (ATARAX ) tablet 25 mg  25 mg Oral TID PRN Motley-Mangrum, Jadeka A, PMHNP       influenza vac split trivalent PF (FLULAVAL) injection 0.5 mL  0.5 mL Intramuscular Tomorrow-1000 Aurelia Blotter, MD       LORazepam  (ATIVAN ) tablet 1 mg  1 mg Oral Q4H PRN Motley-Mangrum, Jadeka A, PMHNP       magnesium  hydroxide (MILK OF MAGNESIA) suspension 30 mL  30 mL Oral Daily PRN Motley-Mangrum, Jadeka A, PMHNP       OLANZapine  (ZYPREXA ) tablet 2.5 mg  2.5 mg Oral q AM Zamari Vea, MD   2.5 mg at 02/09/24 1036   OLANZapine  (ZYPREXA ) tablet 5 mg  5 mg Oral QHS Advith Martine, MD   5 mg at 02/08/24 2110   traZODone  (DESYREL ) tablet 50 mg  50 mg Oral QHS PRN Motley-Mangrum, Jadeka A, PMHNP   50 mg at 02/08/24 2110    Lab Results:  No results found for this or any previous visit (from the past 48 hours).   Blood Alcohol  level:  Lab Results  Component Value Date   [facility] <15 01/31/2024   ETH <10 01/29/2024    Metabolic Disorder Labs: Lab Results  Component Value Date    HGBA1C 6.0 (H) 02/07/2024   MPG 125.5 02/07/2024   No results found for: "PROLACTIN" Lab Results  Component Value Date   CHOL 121 02/07/2024   TRIG 64 02/07/2024   HDL 39 (L) 02/07/2024   CHOLHDL 3.1 02/07/2024   VLDL 13 02/07/2024   LDLCALC 69 02/07/2024    Physical Findings: AIMS:  , ,  ,  ,  CIWA:    COWS:      Psychiatric Specialty Exam:  Presentation  General Appearance:  Appropriate for Environment; Casual  Eye Contact: Fair  Speech: Normal Rate  Speech Volume: Decreased    Mood and Affect  Mood: Anxious; Depressed  Affect: Depressed; Flat   Thought Process  Thought Processes: Disorganized  Descriptions of Associations:Loose  Orientation:Partial  Thought Content:Illogical  Hallucinations: reports lot of voices mumbling  Ideas of Reference:None  Suicidal Thoughts: fleeting with no plan or intent  Homicidal Thoughts: denies   Sensorium  Memory: Immediate Fair; Recent Poor; Remote Poor  Judgment: Impaired  Insight: Shallow   Executive Functions  Concentration: Poor  Attention Span: Poor  Recall: Poor  Fund of Knowledge: Poor  Language: Poor   Psychomotor Activity  Psychomotor Activity: reduced  Musculoskeletal: Strength & Muscle Tone: within normal limits Gait & Station: normal Assets  Assets: Desire for Improvement; Resilience    Physical Exam: Physical Exam Vitals and nursing note reviewed.    ROS Blood pressure (!) 153/90, pulse 80, temperature 97.9 F (36.6 C), resp. rate 18, height 5\' 10"  (1.778 m), weight 117.9 kg, SpO2 92%. Body mass index is 37.31 kg/m.  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  Patient is paralyzed for further safety evaluation and  stabilization   Treatment Plan Summary:   Safety and Monitoring:             -- Voluntary admission to inpatient psychiatric unit for safety, stabilization and treatment             -- Daily contact with patient to assess and evaluate symptoms and progress in treatment             -- Patient's case to be discussed in multi-disciplinary team meeting             -- Observation Level: q15 minute checks             -- Vital signs:  q12 hours             -- Precautions: suicide, elopement, and assault   2. Psychiatric Diagnoses and Treatment:               Lexapro  10 mg daily  zyprexa   2.5 mg QAM and 5 mg QHS Ativan  1 mg every 4 hour as needed needed catatonia-started in ED   -- The risks/benefits/side-effects/alternatives to this medication were discussed in detail with the patient and time was given for questions. The patient consents to medication trial.                -- Metabolic profile and EKG monitoring obtained while on an atypical antipsychotic (BMI: Lipid Panel: HbgA1c: QTc:)              -- Encouraged patient to participate in unit milieu and in scheduled group therapies                            3. Medical Issues Being Addressed:  No urgent medical needs identified   4. Discharge Planning:              -- Social work and case management to assist with discharge planning and identification of hospital follow-up needs prior to discharge             --  Estimated LOS: 5-7 days             -- Discharge Concerns: Need to establish a safety plan; Medication compliance and effectiveness             -- Discharge Goals: Return home with outpatient referrals follow ups   Physician Treatment Plan for Primary Diagnosis: MDD (major depressive disorder) Long Term Goal(s): Improvement in symptoms so as ready for discharge   Short Term Goals: Ability to identify changes in lifestyle to reduce recurrence of condition will improve, Ability to verbalize feelings will improve, Ability to  disclose and discuss suicidal ideas, Ability to demonstrate self-control will improve, and Ability to identify and develop effective coping behaviors will improve   Physician Treatment Plan for Secondary Diagnosis: Principal Problem:   MDD (major depressive disorder)   Long Term Goal(s): Improvement in symptoms so as ready for discharge   Short Term Goals: Ability to identify changes in lifestyle to reduce recurrence of condition will improve, Ability to verbalize feelings will improve, Ability to disclose and discuss suicidal ideas, Ability to demonstrate self-control will improve, Ability to identify and develop effective coping behaviors will improve, Ability to maintain clinical measurements within normal limits will improve, and Compliance with prescribed medications will improve  Aurelia Blotter, MD 02/09/2024, 6:28 PM

## 2024-02-09 NOTE — Group Note (Signed)
 Physical/Occupational Therapy Group Note  Group Topic: Functional, Dynamic Balance   Group Date: 02/09/2024 Start Time: 1300 End Time: 1335 Facilitators: Seraphim Trow, Otelia Blew, PT   Group Description: Group discussed impact of balance on safety and independence with functional tasks.  Identified and discussed any self-perceived balance deficits to personalize information.  Discussed and reviewed strategies to address/improve balance deficits: use of assist devices, activity pacing/energy conservation, environment/home safety modifications, focusing attention/minimizing distraction.  Reviewed and participated with standing LE therex designed to target dynamic balance reactions and LE strength/stability; provided handouts with HEP to be utilized outside of group time as appropriate.  Allowed time for questions and further discussion on any balance or mobility concerns/needs.  Therapeutic Goal(s):  Identify and discuss any individual balance deficits and functional implications. Identify and discuss any environmental/home safety modifications that can optimize balance and safety for mobility within the home. Demonstrate understanding and performance of standing therex designed to target dynamic balance deficits.  Individual Participation: Pt appropriate and actively participated primarily during the activity portions of the session but also minimally during discussion.   Participation Level: Active and Engaged   Participation Quality: Minimal Cues   Behavior: Alert, Appropriate, Attentive , and Cooperative   Speech/Thought Process: Coherent and Focused   Affect/Mood: Appropriate   Insight: Good   Judgement: Good   Modes of Intervention: Activity, Discussion, and Education  Patient Response to Interventions:  Attentive, Engaged, Interested , and Receptive   Plan: Continue to engage patient in PT/OT groups 1 - 2x/week.  Lavenia Post PT, DPT 02/09/24, 2:10 PM

## 2024-02-09 NOTE — Group Note (Signed)
 Date:  02/09/2024 Time:  10:59 AM  Group Topic/Focus:  Outdoor Rec.     Participation Level:  Active  Participation Quality:  Appropriate  Affect:  Appropriate  Cognitive:  Appropriate  Insight: Appropriate  Engagement in Group:  Engaged  Modes of Intervention:  Discussion and Socialization  Additional Comments:  none  Merton Abts 02/09/2024, 10:59 AM

## 2024-02-09 NOTE — Plan of Care (Signed)
  Problem: Education: Goal: Emotional status will improve Outcome: Progressing Goal: Mental status will improve Outcome: Progressing   Problem: Activity: Goal: Interest or engagement in activities will improve Outcome: Progressing Goal: Sleeping patterns will improve Outcome: Progressing   Problem: Physical Regulation: Goal: Ability to maintain clinical measurements within normal limits will improve Outcome: Progressing   Problem: Safety: Goal: Periods of time without injury will increase Outcome: Progressing

## 2024-02-09 NOTE — Group Note (Signed)
 Date:  02/09/2024 Time:  10:49 PM  Group Topic/Focus:  Wrap-Up Group:   The focus of this group is to help patients review their daily goal of treatment and discuss progress on daily workbooks.    Participation Level:  Active  Participation Quality:  Appropriate  Affect:  Appropriate  Cognitive:  Appropriate  Insight: Good  Engagement in Group:  Engaged  Modes of Intervention:  Discussion  Additional Comments:    Rolland Cline 02/09/2024, 10:49 PM

## 2024-02-09 NOTE — Group Note (Signed)
 Recreation Therapy Group Note   Group Topic:Leisure Education  Group Date: 02/09/2024 Start Time: 1345 End Time: 1445 Facilitators: Deatrice Factor, LRT, CTRS Location: Courtyard  Group Description: Music. Patients encouraged to name their favorite song(s) for LRT to play song through speaker for group to hear. Patient educated on the definition of leisure and the importance of having different leisure interests outside of the hospital. Group discussed how leisure activities can often be used as Pharmacologist and that listening to music is one example. Patients also assisted in watering the raised garden beds in the courtyard.   Goal Area(s) Addressed:  Patient will identify a current leisure interest.  Patient will practice making a positive decision. Patient will have the opportunity to try a new leisure activity.  Affect/Mood: Flat   Participation Level: Non-verbal    Clinical Observations/Individualized Feedback: Caleb Leblanc was present in the courtyard during group. Pt did not interact with LRT or peers.   Plan: Continue to engage patient in RT group sessions 2-3x/week.   [address] LRT, CTRS 02/09/2024 3:21 PM

## 2024-02-09 NOTE — Plan of Care (Signed)
  Problem: Education: Goal: Knowledge of [place] [facility] Education information/materials will improve Outcome: Progressing Goal: Mental status will improve Outcome: Progressing   Problem: Activity: Goal: Sleeping patterns will improve Outcome: Progressing   Problem: Coping: Goal: Ability to verbalize frustrations and anger appropriately will improve Outcome: Progressing   Problem: Safety: Goal: Periods of time without injury will increase Outcome: Progressing

## 2024-02-09 NOTE — Progress Notes (Signed)
   02/09/24 1600  Psych Admission Type (Psych Patients Only)  Admission Status Voluntary  Psychosocial Assessment  Patient Complaints Depression  Eye Contact Brief  Facial Expression Flat  Affect Flat  Speech Logical/coherent  Interaction No initiation  Motor Activity Slow  Appearance/Hygiene In scrubs  Behavior Characteristics Cooperative  Mood Depressed  Thought Process  Coherency WDL  Content WDL  Delusions None reported or observed  Perception WDL  Hallucination None reported or observed  Judgment Impaired  Confusion Mild  Danger to Self  Current suicidal ideation? Denies  Agreement Not to Harm Self Yes  Description of Agreement  (verbal)  Danger to Others  Danger to Others None reported or observed

## 2024-02-10 DIAGNOSIS — F333 Major depressive disorder, recurrent, severe with psychotic symptoms: Secondary | ICD-10-CM

## 2024-02-10 LAB — COMPREHENSIVE METABOLIC PANEL WITH GFR
ALT: 52 U/L — ABNORMAL HIGH (ref 0–44)
AST: 30 U/L (ref 15–41)
Albumin: 3 g/dL — ABNORMAL LOW (ref 3.5–5.0)
Alkaline Phosphatase: 37 U/L — ABNORMAL LOW (ref 38–126)
Anion gap: 11 (ref 5–15)
BUN: 12 mg/dL (ref 8–23)
CO2: 25 mmol/L (ref 22–32)
Calcium: 8.6 mg/dL — ABNORMAL LOW (ref 8.9–10.3)
Chloride: 101 mmol/L (ref 98–111)
Creatinine, Ser: 0.92 mg/dL (ref 0.61–1.24)
GFR, Estimated: >60 mL/min (ref >60)
Glucose, Bld: 98 mg/dL (ref 70–99)
Potassium: 3.8 mmol/L (ref 3.5–5.1)
Sodium: 137 mmol/L (ref 135–145)
Total Bilirubin: 0.6 mg/dL (ref 0.0–1.2)
Total Protein: 7.4 g/dL (ref 6.5–8.1)

## 2024-02-10 NOTE — Plan of Care (Signed)
   Problem: Activity: Goal: Interest or engagement in activities will improve Outcome: Not Progressing Goal: Sleeping patterns will improve Outcome: Not Progressing

## 2024-02-10 NOTE — Group Note (Signed)
 Date:  02/10/2024 Time:  11:26 PM  Group Topic/Focus:  Goals Group:   The focus of this group is to help patients establish daily goals to achieve during treatment and discuss how the patient can incorporate goal setting into their daily lives to aide in recovery.    Participation Level:  Active  Participation Quality:  Appropriate  Affect:  Appropriate  Cognitive:  Appropriate  Insight: Good  Engagement in Group:  Engaged  Modes of Intervention:  Discussion  Additional Comments:    Lynette Saras 02/10/2024, 11:26 PM

## 2024-02-10 NOTE — BH IP Treatment Plan (Signed)
 Interdisciplinary Treatment and Diagnostic Plan Update  02/10/2024 Time of Session: 11:09am Caleb Leblanc MRN: [id number]  Principal Diagnosis: MDD (major depressive disorder)  Secondary Diagnoses: Principal Problem:   MDD (major depressive disorder)   Current Medications:  Current Facility-Administered Medications  Medication Dose Route Frequency Provider Last Rate Last Admin   acetaminophen  (TYLENOL ) tablet 650 mg  650 mg Oral Q6H PRN Motley-Mangrum, Jadeka A, PMHNP       alum & mag hydroxide-simeth (MAALOX/MYLANTA) 200-200-20 MG/5ML suspension 30 mL  30 mL Oral Q4H PRN Motley-Mangrum, Jadeka A, PMHNP       amLODipine  (NORVASC ) tablet 10 mg  10 mg Oral Daily Motley-Mangrum, Jadeka A, PMHNP   10 mg at 02/09/24 1030   haloperidol  (HALDOL ) tablet 5 mg  5 mg Oral TID PRN Motley-Mangrum, Jadeka A, PMHNP       And   diphenhydrAMINE  (BENADRYL ) capsule 50 mg  50 mg Oral TID PRN Motley-Mangrum, Jadeka A, PMHNP       haloperidol  lactate (HALDOL ) injection 5 mg  5 mg Intramuscular TID PRN Motley-Mangrum, Jadeka A, PMHNP       And   diphenhydrAMINE  (BENADRYL ) injection 50 mg  50 mg Intramuscular TID PRN Motley-Mangrum, Jadeka A, PMHNP       And   LORazepam  (ATIVAN ) injection 2 mg  2 mg Intramuscular TID PRN Motley-Mangrum, Jadeka A, PMHNP       haloperidol  lactate (HALDOL ) injection 10 mg  10 mg Intramuscular TID PRN Motley-Mangrum, Jadeka A, PMHNP       And   diphenhydrAMINE  (BENADRYL ) injection 50 mg  50 mg Intramuscular TID PRN Motley-Mangrum, Jadeka A, PMHNP       And   LORazepam  (ATIVAN ) injection 2 mg  2 mg Intramuscular TID PRN Motley-Mangrum, Jadeka A, PMHNP       escitalopram  (LEXAPRO ) tablet 10 mg  10 mg Oral Daily Jadapalle, Sree, MD   10 mg at 02/09/24 1030   hydrOXYzine  (ATARAX ) tablet 25 mg  25 mg Oral TID PRN Motley-Mangrum, Jadeka A, PMHNP       influenza vac split trivalent PF (FLULAVAL) injection 0.5 mL  0.5 mL Intramuscular Tomorrow-1000 Aurelia Blotter, MD       lactulose  (CHRONULAC) 10 GM/15ML solution 10 g  10 g Oral BID Jadapalle, Sree, MD   10 g at 02/09/24 2345   LORazepam  (ATIVAN ) tablet 1 mg  1 mg Oral Q4H PRN Motley-Mangrum, Jadeka A, PMHNP       magnesium  hydroxide (MILK OF MAGNESIA) suspension 30 mL  30 mL Oral Daily PRN Motley-Mangrum, Jadeka A, PMHNP       OLANZapine  (ZYPREXA ) tablet 2.5 mg  2.5 mg Oral q AM Jadapalle, Sree, MD   2.5 mg at 02/10/24 0630   OLANZapine  (ZYPREXA ) tablet 5 mg  5 mg Oral QHS Jadapalle, Sree, MD   5 mg at 02/09/24 2112   traZODone  (DESYREL ) tablet 50 mg  50 mg Oral QHS PRN Motley-Mangrum, Jadeka A, PMHNP   50 mg at 02/09/24 2112   PTA Medications: Medications Prior to Admission  Medication Sig Dispense Refill Last Dose/Taking   amLODipine  (NORVASC ) 10 MG tablet Take 10 mg by mouth daily. (Patient not taking: Reported on 02/02/2024)      OLANZapine  (ZYPREXA ) 5 MG tablet Take 1 tablet (5 mg total) by mouth at bedtime. (Patient not taking: Reported on 02/02/2024) 30 tablet 0     Patient Stressors: Other: "Life in general." Patient was unable/refused to go into further detail.     Patient Strengths:  Other: Patient declines  Treatment Modalities: Medication Management, Group therapy, Case management,  1 to 1 session with clinician, Psychoeducation, Recreational therapy.   Physician Treatment Plan for Primary Diagnosis: MDD (major depressive disorder) Long Term Goal(s): Improvement in symptoms so as ready for discharge   Short Term Goals: Ability to identify changes in lifestyle to reduce recurrence of condition will improve Ability to verbalize feelings will improve Ability to disclose and discuss suicidal ideas Ability to demonstrate self-control will improve Ability to identify and develop effective coping behaviors will improve Ability to maintain clinical measurements within normal limits will improve Compliance with prescribed medications will improve  Medication Management: Evaluate patient's response, side  effects, and tolerance of medication regimen.  Therapeutic Interventions: 1 to 1 sessions, Unit Group sessions and Medication administration.  Evaluation of Outcomes: Not Progressing  Physician Treatment Plan for Secondary Diagnosis: Principal Problem:   MDD (major depressive disorder)  Long Term Goal(s): Improvement in symptoms so as ready for discharge   Short Term Goals: Ability to identify changes in lifestyle to reduce recurrence of condition will improve Ability to verbalize feelings will improve Ability to disclose and discuss suicidal ideas Ability to demonstrate self-control will improve Ability to identify and develop effective coping behaviors will improve Ability to maintain clinical measurements within normal limits will improve Compliance with prescribed medications will improve     Medication Management: Evaluate patient's response, side effects, and tolerance of medication regimen.  Therapeutic Interventions: 1 to 1 sessions, Unit Group sessions and Medication administration.  Evaluation of Outcomes: Not Progressing   RN Treatment Plan for Primary Diagnosis: MDD (major depressive disorder) Long Term Goal(s): Knowledge of disease and therapeutic regimen to maintain health will improve  Short Term Goals: Ability to remain free from injury will improve, Ability to verbalize frustration and anger appropriately will improve, Ability to demonstrate self-control, Ability to participate in decision making will improve, Ability to verbalize feelings will improve, Ability to disclose and discuss suicidal ideas, Ability to identify and develop effective coping behaviors will improve, and Compliance with prescribed medications will improve  Medication Management: RN will administer medications as ordered by provider, will assess and evaluate patient's response and provide education to patient for prescribed medication. RN will report any adverse and/or side effects to prescribing  provider.  Therapeutic Interventions: 1 on 1 counseling sessions, Psychoeducation, Medication administration, Evaluate responses to treatment, Monitor vital signs and CBGs as ordered, Perform/monitor CIWA, COWS, AIMS and Fall Risk screenings as ordered, Perform wound care treatments as ordered.  Evaluation of Outcomes: Not Progressing   LCSW Treatment Plan for Primary Diagnosis: MDD (major depressive disorder) Long Term Goal(s): Safe transition to appropriate next level of care at discharge, Engage patient in therapeutic group addressing interpersonal concerns.  Short Term Goals: Engage patient in aftercare planning with referrals and resources, Increase social support, Increase ability to appropriately verbalize feelings, Increase emotional regulation, Facilitate acceptance of mental health diagnosis and concerns, Facilitate patient progression through stages of change regarding substance use diagnoses and concerns, Identify triggers associated with mental health/substance abuse issues, and Increase skills for wellness and recovery  Therapeutic Interventions: Assess for all discharge needs, 1 to 1 time with Social worker, Explore available resources and support systems, Assess for adequacy in community support network, Educate family and significant other(s) on suicide prevention, Complete Psychosocial Assessment, Interpersonal group therapy.  Evaluation of Outcomes: Not Progressing   Progress in Treatment: Attending groups: Yes. Participating in groups: Yes. Taking medication as prescribed: Yes. Toleration medication: Yes. Family/Significant other  contact made: No, will contact:  CSW will continue attempts Patient understands diagnosis: No. Discussing patient identified problems/goals with staff: Yes. Medical problems stabilized or resolved: Yes. Denies suicidal/homicidal ideation: Yes. Issues/concerns per patient self-inventory: Yes. Other:   New problem(s) identified: No, Describe:   None identified. Update 02/10/2024: Pt competency concerns. APS referral completed.    New Short Term/Long Term Goal(s): elimination of symptoms of psychosis, medication management for mood stabilization; elimination of SI thoughts; development of comprehensive mental wellness plan. Update 02/10/2024: No changes at this time.  Patient Goals:  "I'm here to get to normal status again"  Update 02/10/2024: No changes at this time.   Discharge Plan or Barriers: CSW will assist with appropriate discharge planning.  Update 02/10/2024: Pt competency concerns. APS referral completed.    Reason for Continuation of Hospitalization: Medication stabilization   Estimated Length of Stay: 1 to 7 days Update: TBD    Last 3 [place] Suicide Severity Risk Score: Flowsheet Row Admission (Current) from 02/04/2024 in [facility] ED from 01/31/2024 in [facility] Emergency Department at [facility] ED from 01/29/2024 in [facility] Emergency Department at [facility]  C-SSRS RISK CATEGORY Low Risk No Risk No Risk       Last [facility] 2/9 Scores:     No data to display          Scribe for Treatment Team: Friddie Jetty, LCSW 02/10/2024 11:09 AM

## 2024-02-10 NOTE — Progress Notes (Signed)
 [facility] MD Progress Note  02/10/2024 4:22 PM Caleb Leblanc  MRN:  [id number]  Caleb Leblanc is a 63 y.o. male admitted: Presented to the ED on 01/31/2024  5:11 PM for altered mental status. He carries the psychiatric diagnoses of depression and has a past medical history of catatonia, OSA, constipation. His current presentation of despondence, hopeless, suicidal thoughts, flat affect is most consistent with depression.   Subjective:  Chart reviewed, case discussed in multidisciplinary meeting, patient seen during rounds.  Patient has been spending most of the time in his room reports feeling hopeless and no energy has no bizarre behavior noted as such.  Denies any current suicidal and homicidal thoughts he is on Zyprexa  and Lexapro  no side effects reported.   Sleep: Fair  Appetite:  Fair  Past Psychiatric History: see h&P Family History: History reviewed. No pertinent family history. Social History:  Social History   Substance and Sexual Activity  Alcohol  Use Not Currently     Social History   Substance and Sexual Activity  Drug Use Never    Social History   Socioeconomic History   Marital status: Widowed    Spouse name: Not on file   Number of children: Not on file   Years of education: Not on file   Highest education level: Not on file  Occupational History   Not on file  Tobacco Use   Smoking status: Former    Current packs/day: 0.00    Types: Cigarettes    Quit date: 09/09/2017    Years since quitting: 6.4   Smokeless tobacco: Never   Tobacco comments:    pt states he smoked 4 cigs/day before he quit  Vaping Use   Vaping status: Never Used  Substance and Sexual Activity   Alcohol  use: Not Currently   Drug use: Never   Sexual activity: Not on file  Other Topics Concern   Not on file  Social History Narrative   Not on file   Social Drivers of Health   Financial Resource Strain: Not on file  Food Insecurity: Food Insecurity Present (02/04/2024)   Hunger Vital  Sign    Worried About Running Out of Food in the Last Year: Often true    Ran Out of Food in the Last Year: Often true  Transportation Needs: Unmet Transportation Needs (02/04/2024)   PRAPARE - Administrator, Civil Service (Medical): Yes    Lack of Transportation (Non-Medical): Yes  Physical Activity: Not on file  Stress: Not on file  Social Connections: Socially Isolated (01/18/2024)   Social Connection and Isolation Panel [NHANES]    Frequency of Communication with Friends and Family: Never    Frequency of Social Gatherings with Friends and Family: Never    Attends Religious Services: Never    Database administrator or Organizations: No    Attends Engineer, structural: Never    Marital Status: Never married   Past Medical History:  Past Medical History:  Diagnosis Date   Hypertension     Past Surgical History:  Procedure Laterality Date   ABDOMINAL SURGERY      Current Medications: Current Facility-Administered Medications  Medication Dose Route Frequency Provider Last Rate Last Admin   acetaminophen  (TYLENOL ) tablet 650 mg  650 mg Oral Q6H PRN Motley-Mangrum, Jadeka A, PMHNP       alum & mag hydroxide-simeth (MAALOX/MYLANTA) 200-200-20 MG/5ML suspension 30 mL  30 mL Oral Q4H PRN Motley-Mangrum, Jadeka A, PMHNP  amLODipine  (NORVASC ) tablet 10 mg  10 mg Oral Daily Motley-Mangrum, Jadeka A, PMHNP   10 mg at 02/10/24 1147   haloperidol  (HALDOL ) tablet 5 mg  5 mg Oral TID PRN Motley-Mangrum, Jadeka A, PMHNP       And   diphenhydrAMINE  (BENADRYL ) capsule 50 mg  50 mg Oral TID PRN Motley-Mangrum, Jadeka A, PMHNP       haloperidol  lactate (HALDOL ) injection 5 mg  5 mg Intramuscular TID PRN Motley-Mangrum, Jadeka A, PMHNP       And   diphenhydrAMINE  (BENADRYL ) injection 50 mg  50 mg Intramuscular TID PRN Motley-Mangrum, Jadeka A, PMHNP       And   LORazepam  (ATIVAN ) injection 2 mg  2 mg Intramuscular TID PRN Motley-Mangrum, Jadeka A, PMHNP       haloperidol   lactate (HALDOL ) injection 10 mg  10 mg Intramuscular TID PRN Motley-Mangrum, Jadeka A, PMHNP       And   diphenhydrAMINE  (BENADRYL ) injection 50 mg  50 mg Intramuscular TID PRN Motley-Mangrum, Jadeka A, PMHNP       And   LORazepam  (ATIVAN ) injection 2 mg  2 mg Intramuscular TID PRN Motley-Mangrum, Jadeka A, PMHNP       escitalopram  (LEXAPRO ) tablet 10 mg  10 mg Oral Daily Jadapalle, Sree, MD   10 mg at 02/10/24 1147   hydrOXYzine  (ATARAX ) tablet 25 mg  25 mg Oral TID PRN Motley-Mangrum, Jadeka A, PMHNP       influenza vac split trivalent PF (FLULAVAL) injection 0.5 mL  0.5 mL Intramuscular Tomorrow-1000 Aurelia Blotter, MD       lactulose (CHRONULAC) 10 GM/15ML solution 10 g  10 g Oral BID Jadapalle, Sree, MD   10 g at 02/10/24 1147   LORazepam  (ATIVAN ) tablet 1 mg  1 mg Oral Q4H PRN Motley-Mangrum, Jadeka A, PMHNP       magnesium  hydroxide (MILK OF MAGNESIA) suspension 30 mL  30 mL Oral Daily PRN Motley-Mangrum, Jadeka A, PMHNP       OLANZapine  (ZYPREXA ) tablet 2.5 mg  2.5 mg Oral q AM Jadapalle, Sree, MD   2.5 mg at 02/10/24 0630   OLANZapine  (ZYPREXA ) tablet 5 mg  5 mg Oral QHS Jadapalle, Sree, MD   5 mg at 02/09/24 2112   traZODone  (DESYREL ) tablet 50 mg  50 mg Oral QHS PRN Motley-Mangrum, Jadeka A, PMHNP   50 mg at 02/09/24 2112    Lab Results:  Results for orders placed or performed during the hospital encounter of 02/04/24 (from the past 48 hours)  Ammonia     Status: None   Collection Time: 02/09/24 11:20 PM  Result Value Ref Range   Ammonia 20 9 - 35 umol/L    Comment: Performed at [facility], [address] [place], [place] [address]  Comprehensive metabolic panel     Status: Abnormal   Collection Time: 02/10/24  6:35 AM  Result Value Ref Range   Sodium 137 135 - 145 mmol/L   Potassium 3.8 3.5 - 5.1 mmol/L   Chloride 101 98 - 111 mmol/L   CO2 25 22 - 32 mmol/L   Glucose, Bld 98 70 - 99 mg/dL    Comment: Glucose reference range applies only to samples taken after  fasting for at least 8 hours.   BUN 12 8 - 23 mg/dL   Creatinine, Ser 6.38 0.61 - 1.24 mg/dL   Calcium 8.6 (L) 8.9 - 10.3 mg/dL   Total Protein 7.4 6.5 - 8.1 g/dL   Albumin  3.0 (L) 3.5 - 5.0 g/dL   AST 30 15 - 41 U/L   ALT 52 (H) 0 - 44 U/L   Alkaline Phosphatase 37 (L) 38 - 126 U/L   Total Bilirubin 0.6 0.0 - 1.2 mg/dL   GFR, Estimated >96 >04 mL/min    Comment: (NOTE) Calculated using the CKD-EPI Creatinine Equation (2021)    Anion gap 11 5 - 15    Comment: Performed at [facility], [address] [place], [place] [address]     Blood Alcohol  level:  Lab Results  Component Value Date   [facility] <15 01/31/2024   ETH <10 01/29/2024    Metabolic Disorder Labs: Lab Results  Component Value Date   HGBA1C 6.0 (H) 02/07/2024   MPG 125.5 02/07/2024   No results found for: "PROLACTIN" Lab Results  Component Value Date   CHOL 121 02/07/2024   TRIG 64 02/07/2024   HDL 39 (L) 02/07/2024   CHOLHDL 3.1 02/07/2024   VLDL 13 02/07/2024   LDLCALC 69 02/07/2024    Physical Findings: AIMS:  , ,  ,  ,    CIWA:    COWS:      Psychiatric Specialty Exam:  Presentation  General Appearance:  Appropriate for Environment; Casual  Eye Contact: Fair  Speech: Normal Rate  Speech Volume: Decreased    Mood and Affect  Mood: Anxious; Depressed  Affect: Depressed; Flat   Thought Process  Thought Processes: Disorganized  Descriptions of Associations:Loose  Orientation:Partial  Thought Content:Illogical  Hallucinations: reports lot of voices mumbling  Ideas of Reference:None  Suicidal Thoughts: fleeting with no plan or intent  Homicidal Thoughts: denies   Sensorium  Memory: Immediate Fair; Recent Poor; Remote Poor  Judgment: Impaired  Insight: Shallow   Executive Functions  Concentration: Poor  Attention Span: Poor  Recall: Poor  Fund of Knowledge: Poor  Language: Poor   Psychomotor Activity  Psychomotor  Activity: reduced  Musculoskeletal: Strength & Muscle Tone: within normal limits Gait & Station: normal Assets  Assets: Desire for Improvement; Resilience    Physical Exam: Physical Exam Vitals and nursing note reviewed.    ROS Blood pressure (!) 149/85, pulse 77, temperature 98 F (36.7 C), resp. rate 18, height 5\' 10"  (1.778 m), weight 117.9 kg, SpO2 96%. Body mass index is 37.31 kg/m.  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  Patient is paralyzed for further safety evaluation and stabilization   Treatment Plan Summary:   Safety and Monitoring:             -- Voluntary admission to inpatient psychiatric unit for safety, stabilization and treatment             -- Daily contact with patient to assess and evaluate symptoms and progress in treatment             -- Patient's case to be discussed in multi-disciplinary team meeting             -- Observation Level: q15 minute checks             -- Vital signs:  q12 hours             -- Precautions: suicide, elopement, and assault   2. Psychiatric Diagnoses and Treatment:  Lexapro  10 mg daily  zyprexa   2.5 mg QAM and 5 mg QHS Ativan  1 mg every 4 hour as needed needed catatonia-started in ED   -- The risks/benefits/side-effects/alternatives to this medication were discussed in detail with the patient and time was given for questions. The patient consents to medication trial.                -- Metabolic profile and EKG monitoring obtained while on an atypical antipsychotic (BMI: Lipid Panel: HbgA1c: QTc:)              -- Encouraged patient to participate in unit milieu and in scheduled group therapies                            3. Medical Issues Being Addressed:  No urgent  medical needs identified   4. Discharge Planning:              -- Social work and case management to assist with discharge planning and identification of hospital follow-up needs prior to discharge             -- Estimated LOS: 5-7 days             -- Discharge Concerns: Need to establish a safety plan; Medication compliance and effectiveness             -- Discharge Goals: Return home with outpatient referrals follow ups   Physician Treatment Plan for Primary Diagnosis: MDD (major depressive disorder) Long Term Goal(s): Improvement in symptoms so as ready for discharge   Short Term Goals: Ability to identify changes in lifestyle to reduce recurrence of condition will improve, Ability to verbalize feelings will improve, Ability to disclose and discuss suicidal ideas, Ability to demonstrate self-control will improve, and Ability to identify and develop effective coping behaviors will improve   Physician Treatment Plan for Secondary Diagnosis: Principal Problem:   MDD (major depressive disorder)   Long Term Goal(s): Improvement in symptoms so as ready for discharge   Short Term Goals: Ability to identify changes in lifestyle to reduce recurrence of condition will improve, Ability to verbalize feelings will improve, Ability to disclose and discuss suicidal ideas, Ability to demonstrate self-control will improve, Ability to identify and develop effective coping behaviors will improve, Ability to maintain clinical measurements within normal limits will improve, and Compliance with prescribed medications will improve  Zahirah Cheslock R Takesha Steger, MD 02/10/2024, 4:22 PM

## 2024-02-10 NOTE — Progress Notes (Signed)
   02/09/24 2100  Psych Admission Type (Psych Patients Only)  Admission Status Voluntary  Psychosocial Assessment  Patient Complaints Depression  Eye Contact Brief  Facial Expression Flat  Affect Flat  Speech Logical/coherent  Interaction No initiation  Motor Activity Slow  Appearance/Hygiene In scrubs  Behavior Characteristics Cooperative  Mood Depressed  Thought Process  Coherency WDL  Content WDL  Delusions None reported or observed  Perception WDL  Hallucination None reported or observed  Judgment Impaired  Confusion Mild  Danger to Self  Current suicidal ideation? Denies   Pt is alert and oriented. W/d to self. Reports seeing different shapes. Limited interaction with peers and staff. Attends group. Po med compliant. Stat labs obtained via lab tech. Tol well. Denies SI/HIAH. No c/o pain/discomfort noted.

## 2024-02-10 NOTE — Group Note (Signed)
 [facility] LCSW Group Therapy Note   Group Date: 02/10/2024 Start Time: 1320 End Time: 1420   Type of Therapy/Topic:  Group Therapy:  Emotion Regulation  Participation Level:  Minimal   Mood:  Description of Group:    The purpose of this group is to assist patients in learning to regulate negative emotions and experience positive emotions. Patients will be guided to discuss ways in which they have been vulnerable to their negative emotions. These vulnerabilities will be juxtaposed with experiences of positive emotions or situations, and patients challenged to use positive emotions to combat negative ones. Special emphasis will be placed on coping with negative emotions in conflict situations, and patients will process healthy conflict resolution skills.  Therapeutic Goals: Patient will identify two positive emotions or experiences to reflect on in order to balance out negative emotions:  Patient will label two or more emotions that they find the most difficult to experience:  Patient will be able to demonstrate positive conflict resolution skills through discussion or role plays:   Summary of Patient Progress:   Patient was alert and minimally active in group.    Therapeutic Modalities:   Cognitive Behavioral Therapy Feelings Identification Dialectical Behavioral Therapy   Darris Emery Deby Adger, LCSW

## 2024-02-10 NOTE — Progress Notes (Signed)
   02/10/24 1200  Psych Admission Type (Psych Patients Only)  Admission Status Voluntary  Psychosocial Assessment  Patient Complaints Depression  Eye Contact Brief  Facial Expression Flat  Affect Flat  Speech Logical/coherent  Interaction Minimal  Motor Activity Slow  Appearance/Hygiene In scrubs  Behavior Characteristics Cooperative  Mood Depressed  Thought Process  Coherency WDL  Content WDL  Delusions None reported or observed  Perception WDL  Hallucination None reported or observed  Judgment Impaired  Confusion Mild  Danger to Self  Current suicidal ideation? Denies  Agreement Not to Harm Self Yes  Description of Agreement verbal  Danger to Others  Danger to Others None reported or observed

## 2024-02-10 NOTE — Group Note (Signed)
 Date:  02/10/2024 Time:  11:16 AM  Group Topic/Focus:  Building Self Esteem:   The Focus of this group is helping patients become aware of the effects of self-esteem on their lives, the things they and others do that enhance or undermine their self-esteem, seeing the relationship between their level of self-esteem and the choices they make and learning ways to enhance self-esteem. Goals Group:   The focus of this group is to help patients establish daily goals to achieve during treatment and discuss how the patient can incorporate goal setting into their daily lives to aide in recovery. Making Healthy Choices:   The focus of this group is to help patients identify negative/unhealthy choices they were using prior to admission and identify positive/healthier coping strategies to replace them upon discharge. Self Care:   The focus of this group is to help patients understand the importance of self-care in order to improve or restore emotional, physical, spiritual, interpersonal, and financial health.    Participation Level:  Did Not Attend  Participation Quality:    Affect:    Cognitive:    Insight:   Engagement in Group:    Modes of Intervention:    Additional Comments:  Refused to come to Group!  Caleb Leblanc Caleb Leblanc 02/10/2024, 11:16 AM

## 2024-02-10 NOTE — Plan of Care (Signed)
   Problem: Education: Goal: Knowledge of [facility] Education information/materials will improve Outcome: Progressing Goal: Emotional status will improve Outcome: Progressing Goal: Mental status will improve Outcome: Progressing Goal: Verbalization of understanding the information provided will improve Outcome: Progressing

## 2024-02-11 DIAGNOSIS — F333 Major depressive disorder, recurrent, severe with psychotic symptoms: Secondary | ICD-10-CM | POA: Diagnosis not present

## 2024-02-11 NOTE — Progress Notes (Signed)
   02/10/24 2100  Psych Admission Type (Psych Patients Only)  Admission Status Voluntary  Psychosocial Assessment  Patient Complaints Depression  Eye Contact Brief  Facial Expression Flat  Affect Flat  Speech Logical/coherent  Interaction Minimal  Motor Activity Slow  Appearance/Hygiene In scrubs  Behavior Characteristics Cooperative  Mood Depressed  Thought Process  Coherency WDL  Content WDL  Delusions None reported or observed  Perception WDL  Hallucination None reported or observed  Judgment Impaired  Confusion Mild  Danger to Self  Current suicidal ideation? Denies

## 2024-02-11 NOTE — Group Note (Signed)
 Date:  02/11/2024 Time:  9:32 PM  Group Topic/Focus:  Wrap-Up Group:   The focus of this group is to help patients review their daily goal of treatment and discuss progress on daily workbooks.    Participation Level:  Active  Participation Quality:  Appropriate  Affect:  Appropriate  Cognitive:  Appropriate  Insight: Improving  Engagement in Group:  Improving  Modes of Intervention:  Discussion  Additional Comments:    Rolland Cline 02/11/2024, 9:32 PM

## 2024-02-11 NOTE — Plan of Care (Signed)

## 2024-02-11 NOTE — Progress Notes (Signed)
 Patient: Caleb Leblanc MRN: [id number] Date of Note: Feb 11, 2024 Time of Note: 9:25 PM EDT  Reason for Encounter: Daily Progress Note  History of Present Illness Summary: Caleb Leblanc is a 63 year old male with a history of depression, catatonia, cognitive delay, and OSA, residing in a group home. He was admitted voluntarily on 01/31/2024 due to altered mental status, despondence, hopelessness, and suicidal thoughts. Current presentation consistent with MDD, severe, with psychotic features.  Subjective: Based on yesterday's report, patient likely continues to feel hopeless and lacking energy (anergia), spending most of his time in his room. He denied medication side effects yesterday. Reassessment today needed, but likely continues to deny acute suicidal or homicidal ideation, though hopelessness persists. Sleep and appetite reported as fair yesterday.  Objective:  Admission Status: Voluntary, [facility] unit since 01/31/24. Behavior: Patient is observed to be generally withdrawn with reduced psychomotor activity. Cooperative when approached by staff but demonstrates minimal spontaneous interaction or engagement in the milieu. No acute behavioral disturbances noted. Mental Status Exam: Mood likely remains depressed and anxious. Affect likely remains flat or depressed. Thought process expected to be disorganized with loose associations, based on yesterday's exam. Thought content: Hopelessness likely persists. Requires assessment for persistence of auditory hallucinations ("mumbling voices" reported yesterday). Expected to deny SI/HI today, but assess explicitly. Orientation likely remains partial. Insight and judgment remain poor/shallow. Cognition: Significant deficits in memory, attention, concentration likely persist. Medications: Continues Escitalopram  10mg  PO Daily, Olanzapine  2.5mg  PO QAM / 5mg  PO QHS. Lorazepam  1mg  PO Q4H PRN available (assess frequency of use). Lactulose 10g PO BID  continued for constipation. Amlodipine  10mg  daily for HTN. Multiple other PRNs available (Trazodone , Haloperidol , Hydroxyzine , etc.). Monitoring: Currently on Q15 minute checks. Standard precautions (Suicide/Elopement/Assault). Vitals monitored Q12hr (BP elevated at 149/85 yesterday). Monitoring recent labs (mildly low Ca/Albumin/Alk Phos, elevated ALT, borderline A1c 6.0%). Monitoring for constipation and signs of catatonia. Assessment: Caleb Leblanc, a 63 y.o. male with MDD with psychotic features, cognitive delay, and history of catatonia, remains significantly symptomatic despite current treatment. Core depressive features of hopelessness, anergia, social withdrawal, and psychomotor retardation persist. His thought process remains disorganized, and auditory hallucinations were present yesterday (status today unconfirmed). While denying active SI/HI, his pervasive hopelessness constitutes ongoing risk. Response to Escitalopram  10mg  and Olanzapine  (total 7.5mg /day) appears limited thus far. Close monitoring for catatonia is warranted given history. Medical comorbidities (HTN, constipation, borderline DM control, mild LFT/lab abnormalities) require ongoing attention.  Principal Diagnosis: MDD (major depressive disorder), Severe with psychotic features. Plan:  Medications: Continue Escitalopram  10mg  PO Daily. Continue Olanzapine  2.5mg  PO QAM and 5mg  PO QHS. Consider potential need for dose increase or augmentation if depressive and psychotic symptoms do not improve. Continue Lorazepam  1mg  PO Q4H PRN - monitor frequency of administration and rationale (anxiety vs. potential catatonia). Continue Lactulose 10g PO BID; monitor bowel movements. Continue Amlodipine  10mg  PO Daily; monitor blood pressure. Continue availability of other PRNs as needed. Monitor for medication side effects (sedation, metabolic effects from Olanzapine , EPS). Monitoring: Maintain Q15 minute checks and standard precautions. Continue  vital sign monitoring Q12hr (target BP control). Review repeat labs if ordered. Assess mental status daily, specifically clarifying presence/absence of AH and SI/HI today. Monitor for any signs of catatonia (e.g., waxy flexibility, mutism, posturing, negativism). Monitor sleep, appetite, ADLs, bowel function. Therapeutic Interventions: Continue to encourage milieu participation, though response may be limited. Provide brief, frequent, supportive interactions. Medical: Continue monitoring labs and vital signs. Address constipation actively. Psychosocial/Disposition: Liaise with group home staff regarding patient's progress  and anticipated needs. Continue SW/CM involvement for discharge planning back to group home when psychiatrically stable

## 2024-02-11 NOTE — Progress Notes (Signed)
 Patient was cooperative with treatment and medications on the shift . He had minimal interactions with peers and staff on the unit. He was visible in the dayroom most of the shift. Patient reported no concerns to nursing staff.

## 2024-02-11 NOTE — Progress Notes (Signed)
   02/11/24 0645  15 Minute Checks  Location Bedroom  Visual Appearance Calm  Behavior Sleeping  Sleep (Behavioral Health Patients Only)  Calculate sleep? (Click Yes once per 24 hr at 0600 safety check) Yes  Documented sleep last 24 hours 7.5

## 2024-02-11 NOTE — Plan of Care (Signed)
   Problem: Activity: Goal: Interest or engagement in activities will improve Outcome: Progressing

## 2024-02-12 DIAGNOSIS — F331 Major depressive disorder, recurrent, moderate: Secondary | ICD-10-CM | POA: Diagnosis not present

## 2024-02-12 MED ORDER — OLANZAPINE 5 MG PO TABS
7.5000 mg | ORAL_TABLET | Freq: Every day | ORAL | Status: DC
  Administered 2024-02-12 – 2024-02-18 (×7): 7.5 mg via ORAL
  Filled 2024-02-12 (×7): qty 2

## 2024-02-12 NOTE — Group Note (Signed)
 Recreation Therapy Group Note   Group Topic:Relaxation  Group Date: 02/12/2024 Start Time: 1400 End Time: 1450 Facilitators: Deatrice Factor, LRT, CTRS Location: Courtyard  Group Description: Mindfulness Body Scan. LRT educated on the benefits of mindfulness and how it can apply to everyday life post-discharge. LRT and pt's followed along to an audio script of a "mindfulness body scan" video. Afterwards, LRT encourages patients to help assist with watering the raised garden beds.   Goal Area(s) Addressed: Patient will practice using relaxation technique. Patient will identify a new coping skill.  Patient will follow multistep directions to reduce anxiety and stress.   Affect/Mood: Appropriate   Participation Level: Engaged   Participation Quality: Independent   Behavior: Calm and Cooperative   Speech/Thought Process: Coherent   Insight: Good   Judgement: Good   Modes of Intervention: Education, Exploration, and Support   Patient Response to Interventions:  Attentive, Engaged, and Receptive   Education Outcome:  Acknowledges education   Clinical Observations/Individualized Feedback: Caleb Leblanc was active in their participation of session activities and group discussion. Pt completed all prompts as encouraged. Pt interacted well with LRT and peers duration of session.    Plan: Continue to engage patient in RT group sessions 2-3x/week.   Deatrice Factor, LRT, CTRS 02/12/2024 4:37 PM

## 2024-02-12 NOTE — Progress Notes (Signed)
   02/11/24 2200  Psych Admission Type (Psych Patients Only)  Admission Status Voluntary  Psychosocial Assessment  Patient Complaints Depression  Eye Contact Brief  Facial Expression Flat  Affect Flat  Speech Logical/coherent  Interaction Minimal  Motor Activity Slow  Appearance/Hygiene In scrubs  Behavior Characteristics Cooperative  Mood Depressed  Thought Process  Coherency WDL  Content WDL  Delusions None reported or observed  Perception Hallucinations  Hallucination Visual  Judgment Impaired  Confusion Mild

## 2024-02-12 NOTE — Progress Notes (Signed)
 [facility] MD Progress Note  02/12/2024 3:08 PM Caleb Leblanc  MRN:  [id number]  Caleb Leblanc is a 63 y.o. male admitted: Presented to the ED on 01/31/2024  5:11 PM for altered mental status. He carries the psychiatric diagnoses of depression and has a past medical history of catatonia, OSA, constipation. His current presentation of despondence, hopeless, suicidal thoughts, flat affect is most consistent with depression.   Subjective:  Chart reviewed, case discussed in multidisciplinary meeting, patient seen during rounds.  Patient is noted to be sitting in the day area.  He reports that the hallucinations/noises improved.  He denies suicidal/homicidal ideations.  He reports feeling anxious about his care once he gets discharged.  He remains future oriented and wants help with finances and self-care.  He acknowledges that he is having memory problems and is unable to remember information to take care of himself in the community.  Per nursing staff patient displays flat affect, isolating himself.   Sleep: Fair  Appetite:  Fair  Past Psychiatric History: see h&P Family History: History reviewed. No pertinent family history. Social History:  Social History   Substance and Sexual Activity  Alcohol  Use Not Currently     Social History   Substance and Sexual Activity  Drug Use Never    Social History   Socioeconomic History   Marital status: Widowed    Spouse name: Not on file   Number of children: Not on file   Years of education: Not on file   Highest education level: Not on file  Occupational History   Not on file  Tobacco Use   Smoking status: Former    Current packs/day: 0.00    Types: Cigarettes    Quit date: 09/09/2017    Years since quitting: 6.4   Smokeless tobacco: Never   Tobacco comments:    pt states he smoked 4 cigs/day before he quit  Vaping Use   Vaping status: Never Used  Substance and Sexual Activity   Alcohol  use: Not Currently   Drug use: Never   Sexual  activity: Not on file  Other Topics Concern   Not on file  Social History Narrative   Not on file   Social Drivers of Health   Financial Resource Strain: Not on file  Food Insecurity: Food Insecurity Present (02/04/2024)   Hunger Vital Sign    Worried About Running Out of Food in the Last Year: Often true    Ran Out of Food in the Last Year: Often true  Transportation Needs: Unmet Transportation Needs (02/04/2024)   PRAPARE - Administrator, Civil Service (Medical): Yes    Lack of Transportation (Non-Medical): Yes  Physical Activity: Not on file  Stress: Not on file  Social Connections: Socially Isolated (01/18/2024)   Social Connection and Isolation Panel [NHANES]    Frequency of Communication with Friends and Family: Never    Frequency of Social Gatherings with Friends and Family: Never    Attends Religious Services: Never    Database administrator or Organizations: No    Attends Engineer, structural: Never    Marital Status: Never married   Past Medical History:  Past Medical History:  Diagnosis Date   Hypertension     Past Surgical History:  Procedure Laterality Date   ABDOMINAL SURGERY      Current Medications: Current Facility-Administered Medications  Medication Dose Route Frequency Provider Last Rate Last Admin   acetaminophen  (TYLENOL ) tablet 650 mg  650 mg  Oral Q6H PRN Motley-Mangrum, Jadeka A, PMHNP       alum & mag hydroxide-simeth (MAALOX/MYLANTA) 200-200-20 MG/5ML suspension 30 mL  30 mL Oral Q4H PRN Motley-Mangrum, Jadeka A, PMHNP       amLODipine  (NORVASC ) tablet 10 mg  10 mg Oral Daily Motley-Mangrum, Jadeka A, PMHNP   10 mg at 02/12/24 1009   haloperidol  (HALDOL ) tablet 5 mg  5 mg Oral TID PRN Motley-Mangrum, Jadeka A, PMHNP       And   diphenhydrAMINE  (BENADRYL ) capsule 50 mg  50 mg Oral TID PRN Motley-Mangrum, Jadeka A, PMHNP       haloperidol  lactate (HALDOL ) injection 5 mg  5 mg Intramuscular TID PRN Motley-Mangrum, Jadeka A, PMHNP        And   diphenhydrAMINE  (BENADRYL ) injection 50 mg  50 mg Intramuscular TID PRN Motley-Mangrum, Jadeka A, PMHNP       And   LORazepam  (ATIVAN ) injection 2 mg  2 mg Intramuscular TID PRN Motley-Mangrum, Jadeka A, PMHNP       haloperidol  lactate (HALDOL ) injection 10 mg  10 mg Intramuscular TID PRN Motley-Mangrum, Jadeka A, PMHNP       And   diphenhydrAMINE  (BENADRYL ) injection 50 mg  50 mg Intramuscular TID PRN Motley-Mangrum, Jadeka A, PMHNP       And   LORazepam  (ATIVAN ) injection 2 mg  2 mg Intramuscular TID PRN Motley-Mangrum, Jadeka A, PMHNP       escitalopram  (LEXAPRO ) tablet 10 mg  10 mg Oral Daily Mariadelosang Wynns, MD   10 mg at 02/12/24 1010   hydrOXYzine  (ATARAX ) tablet 25 mg  25 mg Oral TID PRN Motley-Mangrum, Jadeka A, PMHNP       influenza vac split trivalent PF (FLULAVAL) injection 0.5 mL  0.5 mL Intramuscular Tomorrow-1000 Aurelia Blotter, MD       lactulose (CHRONULAC) 10 GM/15ML solution 10 g  10 g Oral BID Dalina Samara, MD   10 g at 02/12/24 1009   LORazepam  (ATIVAN ) tablet 1 mg  1 mg Oral Q4H PRN Motley-Mangrum, Jadeka A, PMHNP       magnesium  hydroxide (MILK OF MAGNESIA) suspension 30 mL  30 mL Oral Daily PRN Motley-Mangrum, Jadeka A, PMHNP       OLANZapine  (ZYPREXA ) tablet 2.5 mg  2.5 mg Oral q AM Shawn Carattini, MD   2.5 mg at 02/12/24 0617   OLANZapine  (ZYPREXA ) tablet 7.5 mg  7.5 mg Oral QHS Amerigo Mcglory, MD       traZODone  (DESYREL ) tablet 50 mg  50 mg Oral QHS PRN Motley-Mangrum, Jadeka A, PMHNP   50 mg at 02/10/24 2121    Lab Results:  No results found for this or any previous visit (from the past 48 hours).   Blood Alcohol  level:  Lab Results  Component Value Date   [facility] <15 01/31/2024   ETH <10 01/29/2024    Metabolic Disorder Labs: Lab Results  Component Value Date   HGBA1C 6.0 (H) 02/07/2024   MPG 125.5 02/07/2024   No results found for: "PROLACTIN" Lab Results  Component Value Date   CHOL 121 02/07/2024   TRIG 64 02/07/2024   HDL 39  (L) 02/07/2024   CHOLHDL 3.1 02/07/2024   VLDL 13 02/07/2024   LDLCALC 69 02/07/2024    Physical Findings: AIMS:  , ,  ,  ,    CIWA:    COWS:      Psychiatric Specialty Exam:  Presentation  General Appearance:  Appropriate for Environment; Casual  Eye Contact: Minimal  Speech: Normal Rate  Speech Volume: Decreased    Mood and Affect  Mood: Depressed  Affect: Depressed   Thought Process  Thought Processes: Linear  Descriptions of Associations:Intact  Orientation:Partial  Thought Content:Illogical  Hallucinations: reports lot of voices mumbling  Ideas of Reference:None  Suicidal Thoughts: fleeting with no plan or intent  Homicidal Thoughts: denies   Sensorium  Memory: Immediate Fair; Recent Poor; Remote Poor  Judgment: Impaired  Insight: Shallow   Executive Functions  Concentration: Poor  Attention Span: Poor  Recall: Poor  Fund of Knowledge: Poor  Language: Poor   Psychomotor Activity  Psychomotor Activity: reduced  Musculoskeletal: Strength & Muscle Tone: within normal limits Gait & Station: normal Assets  Assets: Manufacturing systems engineer; Desire for Improvement; Resilience    Physical Exam: Physical Exam Vitals and nursing note reviewed.    ROS Blood pressure (!) 146/102, pulse 100, temperature 98.4 F (36.9 C), temperature source Oral, resp. rate 16, height 5\' 10"  (1.778 m), weight 117.9 kg, SpO2 93%. Body mass index is 37.31 kg/m.  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  Patient is paralyzed for further safety evaluation and stabilization 02/12/24:Patient continues to have psychotic symptoms , displays flat affect and hopelessness. Will increase dose of zyprexa    Treatment  Plan Summary:   Safety and Monitoring:             -- Voluntary admission to inpatient psychiatric unit for safety, stabilization and treatment             -- Daily contact with patient to assess and evaluate symptoms and progress in treatment             -- Patient's case to be discussed in multi-disciplinary team meeting             -- Observation Level: q15 minute checks             -- Vital signs:  q12 hours             -- Precautions: suicide, elopement, and assault   2. Psychiatric Diagnoses and Treatment:               Lexapro  10 mg daily  zyprexa   2.5 mg QAM and increase to 7.5mg  QHS Ativan  1 mg every 4 hour as needed needed catatonia-started in ED   -- The risks/benefits/side-effects/alternatives to this medication were discussed in detail with the patient and time was given for questions. The patient consents to medication trial.                -- Metabolic profile and EKG monitoring obtained while on an atypical antipsychotic (BMI: Lipid Panel: HbgA1c: QTc:)              -- Encouraged patient to participate in unit milieu and in scheduled group therapies                            3. Medical Issues Being Addressed:  No urgent medical needs identified   4. Discharge Planning:              -- Social work and case management to assist with discharge planning and identification of hospital follow-up needs prior to discharge             --  Estimated LOS: 5-7 days             -- Discharge Concerns: Need to establish a safety plan; Medication compliance and effectiveness             -- Discharge Goals: Return home with outpatient referrals follow ups   Physician Treatment Plan for Primary Diagnosis: MDD (major depressive disorder) Long Term Goal(s): Improvement in symptoms so as ready for discharge   Short Term Goals: Ability to identify changes in lifestyle to reduce recurrence of condition will improve, Ability to verbalize feelings will improve, Ability to disclose and  discuss suicidal ideas, Ability to demonstrate self-control will improve, and Ability to identify and develop effective coping behaviors will improve   Physician Treatment Plan for Secondary Diagnosis: Principal Problem:   MDD (major depressive disorder)   Long Term Goal(s): Improvement in symptoms so as ready for discharge   Short Term Goals: Ability to identify changes in lifestyle to reduce recurrence of condition will improve, Ability to verbalize feelings will improve, Ability to disclose and discuss suicidal ideas, Ability to demonstrate self-control will improve, Ability to identify and develop effective coping behaviors will improve, Ability to maintain clinical measurements within normal limits will improve, and Compliance with prescribed medications will improve  Aurelia Blotter, MD 02/12/2024, 3:08 PM

## 2024-02-12 NOTE — Plan of Care (Signed)

## 2024-02-12 NOTE — Plan of Care (Signed)
  Problem: Coping: Goal: Ability to demonstrate self-control will improve Outcome: Progressing   Problem: Health Behavior/Discharge Planning: Goal: Compliance with treatment plan for underlying cause of condition will improve Outcome: Progressing   Problem: Safety: Goal: Periods of time without injury will increase Outcome: Progressing   Problem: Activity: Goal: Interest or engagement in activities will improve Outcome: Not Progressing   Problem: Coping: Goal: Ability to verbalize frustrations and anger appropriately will improve Outcome: Not Progressing

## 2024-02-12 NOTE — Progress Notes (Signed)
 Patient with flat affect.  Speech is soft and slow. Endorses depression, passive SI and AVH.  Contracts for safety. Patient reports he slept well.  Denies pain.   Compliant with scheduled medications on 2nd attempt.  15 min checks in place for safety.  Patient declined breakfast, but came to dayroom for lunch.   Present in the milieu the rest of the day.  Minimal interaction with peers and staff.

## 2024-02-12 NOTE — [facility] Counselor (Signed)
 CSW contacted [facility] APS [phone]) to submit APS report.   CSW awaits on decision regarding report.   Derrill Flirt, MSW, [place] 02/12/2024 1:31 PM

## 2024-02-12 NOTE — Group Note (Signed)
 Date:  02/12/2024 Time:  2:08 PM  Group Topic/Focus:  Healthy Communication:   The focus of this group is to discuss communication, barriers to communication, as well as healthy ways to communicate with others.    Participation Level:  Active  Participation Quality:  Appropriate  Affect:  Appropriate  Cognitive:  Appropriate  Insight: Appropriate  Engagement in Group:  Engaged  Modes of Intervention:  Activity  Additional Comments:    Karla Pavone 02/12/2024, 2:08 PM

## 2024-02-13 DIAGNOSIS — F331 Major depressive disorder, recurrent, moderate: Secondary | ICD-10-CM | POA: Diagnosis not present

## 2024-02-13 NOTE — [facility] Counselor (Signed)
 CSW received call from [facility] caseworker Kristian Petty.   Shelvy Dickens reports she has been assigned to pt's case and would like to schedule to come see pt.   CSW and Shelvy Dickens scheduled for Thursday 02/15/24 at 12:00 PM.   CSW will inform care team .    Derrill Flirt, MSW, [facility] 02/13/2024 4:44 PM

## 2024-02-13 NOTE — Progress Notes (Signed)
 Patient is pleasant and cooperative.  Sad affect.  Endorses depression and AH.  Denies SI/HI and VH.  Denies pain.  Reports he slept well.  Compliant with scheduled medications after encouragement from Dr. Kirk Peper.  15 min checks in place for safety.  Patient is present in the milieu.  Minimal interaction with peers and staff.

## 2024-02-13 NOTE — Plan of Care (Signed)
  Problem: Education: Goal: Emotional status will improve Outcome: Progressing Goal: Mental status will improve Outcome: Progressing   Problem: Activity: Goal: Interest or engagement in activities will improve Outcome: Progressing Goal: Sleeping patterns will improve Outcome: Progressing   Problem: Coping: Goal: Ability to verbalize frustrations and anger appropriately will improve Outcome: Progressing   Problem: Health Behavior/Discharge Planning: Goal: Compliance with treatment plan for underlying cause of condition will improve Outcome: Progressing   Problem: Safety: Goal: Periods of time without injury will increase Outcome: Progressing

## 2024-02-13 NOTE — Progress Notes (Signed)
 [facility] MD Progress Note  02/13/2024 9:02 PM Caleb Leblanc  MRN:  [id number]  Caleb Leblanc is a 63 y.o. male admitted: Presented to the ED on 01/31/2024  5:11 PM for altered mental status. He carries the psychiatric diagnoses of depression and has a past medical history of catatonia, OSA, constipation. His current presentation of despondence, hopeless, suicidal thoughts, flat affect is most consistent with depression.   Subjective:  Chart reviewed, case discussed in multidisciplinary meeting, patient seen during rounds.  Patient is noted to be sitting in the day area.  He offers no complaints.  He continues to state feeling tired.  Today he denies SI/HI, not responding to internal stimuli.  Per nursing report he refused morning medication.  After provider encouraging to take his medication he eventually took them.  He is requesting help with the social worker to sort out resources in the community.  The treatment team has noted that patient displays significant amount of memory lapses and cognitive deficits with he has hard time processing information and deficits in executive function that is required to financially support himself in the community.  APS report has been filed to assess for guardianship.   Sleep: Fair  Appetite:  Fair  Past Psychiatric History: see h&P Family History: History reviewed. No pertinent family history. Social History:  Social History   Substance and Sexual Activity  Alcohol  Use Not Currently     Social History   Substance and Sexual Activity  Drug Use Never    Social History   Socioeconomic History   Marital status: Widowed    Spouse name: Not on file   Number of children: Not on file   Years of education: Not on file   Highest education level: Not on file  Occupational History   Not on file  Tobacco Use   Smoking status: Former    Current packs/day: 0.00    Types: Cigarettes    Quit date: 09/09/2017    Years since quitting: 6.4   Smokeless  tobacco: Never   Tobacco comments:    pt states he smoked 4 cigs/day before he quit  Vaping Use   Vaping status: Never Used  Substance and Sexual Activity   Alcohol  use: Not Currently   Drug use: Never   Sexual activity: Not on file  Other Topics Concern   Not on file  Social History Narrative   Not on file   Social Drivers of Health   Financial Resource Strain: Not on file  Food Insecurity: Food Insecurity Present (02/04/2024)   Hunger Vital Sign    Worried About Running Out of Food in the Last Year: Often true    Ran Out of Food in the Last Year: Often true  Transportation Needs: Unmet Transportation Needs (02/04/2024)   PRAPARE - Administrator, Civil Service (Medical): Yes    Lack of Transportation (Non-Medical): Yes  Physical Activity: Not on file  Stress: Not on file  Social Connections: Socially Isolated (01/18/2024)   Social Connection and Isolation Panel [NHANES]    Frequency of Communication with Friends and Family: Never    Frequency of Social Gatherings with Friends and Family: Never    Attends Religious Services: Never    Database administrator or Organizations: No    Attends Banker Meetings: Never    Marital Status: Never married   Past Medical History:  Past Medical History:  Diagnosis Date   Hypertension     Past Surgical History:  Procedure Laterality Date   ABDOMINAL SURGERY      Current Medications: Current Facility-Administered Medications  Medication Dose Route Frequency Provider Last Rate Last Admin   acetaminophen  (TYLENOL ) tablet 650 mg  650 mg Oral Q6H PRN Motley-Mangrum, Jadeka A, PMHNP       alum & mag hydroxide-simeth (MAALOX/MYLANTA) 200-200-20 MG/5ML suspension 30 mL  30 mL Oral Q4H PRN Motley-Mangrum, Jadeka A, PMHNP       amLODipine  (NORVASC ) tablet 10 mg  10 mg Oral Daily Motley-Mangrum, Jadeka A, PMHNP   10 mg at 02/13/24 1328   haloperidol  (HALDOL ) tablet 5 mg  5 mg Oral TID PRN Motley-Mangrum, Jadeka A,  PMHNP       And   diphenhydrAMINE  (BENADRYL ) capsule 50 mg  50 mg Oral TID PRN Motley-Mangrum, Jadeka A, PMHNP       haloperidol  lactate (HALDOL ) injection 5 mg  5 mg Intramuscular TID PRN Motley-Mangrum, Jadeka A, PMHNP       And   diphenhydrAMINE  (BENADRYL ) injection 50 mg  50 mg Intramuscular TID PRN Motley-Mangrum, Jadeka A, PMHNP       And   LORazepam  (ATIVAN ) injection 2 mg  2 mg Intramuscular TID PRN Motley-Mangrum, Jadeka A, PMHNP       haloperidol  lactate (HALDOL ) injection 10 mg  10 mg Intramuscular TID PRN Motley-Mangrum, Jadeka A, PMHNP       And   diphenhydrAMINE  (BENADRYL ) injection 50 mg  50 mg Intramuscular TID PRN Motley-Mangrum, Jadeka A, PMHNP       And   LORazepam  (ATIVAN ) injection 2 mg  2 mg Intramuscular TID PRN Motley-Mangrum, Jadeka A, PMHNP       escitalopram  (LEXAPRO ) tablet 10 mg  10 mg Oral Daily Tarrin Menn, MD   10 mg at 02/13/24 1106   hydrOXYzine  (ATARAX ) tablet 25 mg  25 mg Oral TID PRN Motley-Mangrum, Jadeka A, PMHNP       influenza vac split trivalent PF (FLULAVAL) injection 0.5 mL  0.5 mL Intramuscular Tomorrow-1000 Aurelia Blotter, MD       lactulose (CHRONULAC) 10 GM/15ML solution 10 g  10 g Oral BID Cartel Mauss, MD   10 g at 02/13/24 1328   LORazepam  (ATIVAN ) tablet 1 mg  1 mg Oral Q4H PRN Motley-Mangrum, Jadeka A, PMHNP       magnesium  hydroxide (MILK OF MAGNESIA) suspension 30 mL  30 mL Oral Daily PRN Motley-Mangrum, Jadeka A, PMHNP       OLANZapine  (ZYPREXA ) tablet 2.5 mg  2.5 mg Oral q AM Sherissa Tenenbaum, MD   2.5 mg at 02/13/24 1106   OLANZapine  (ZYPREXA ) tablet 7.5 mg  7.5 mg Oral QHS Jaiyah Beining, MD   7.5 mg at 02/12/24 2120   traZODone  (DESYREL ) tablet 50 mg  50 mg Oral QHS PRN Motley-Mangrum, Jadeka A, PMHNP   50 mg at 02/12/24 2121    Lab Results:  No results found for this or any previous visit (from the past 48 hours).   Blood Alcohol  level:  Lab Results  Component Value Date   [facility] <15 01/31/2024   ETH <10 01/29/2024     Metabolic Disorder Labs: Lab Results  Component Value Date   HGBA1C 6.0 (H) 02/07/2024   MPG 125.5 02/07/2024   No results found for: "PROLACTIN" Lab Results  Component Value Date   CHOL 121 02/07/2024   TRIG 64 02/07/2024   HDL 39 (L) 02/07/2024   CHOLHDL 3.1 02/07/2024   VLDL 13 02/07/2024   LDLCALC 69 02/07/2024  Physical Findings: AIMS:  , ,  ,  ,    CIWA:    COWS:      Psychiatric Specialty Exam:  Presentation  General Appearance:  Disheveled  Eye Contact: Minimal  Speech: Slow  Speech Volume: Decreased    Mood and Affect  Mood: Dysphoric  Affect: Depressed   Thought Process  Thought Processes: Irrevelant (impoverished)  Descriptions of Associations:Intact  Orientation:Partial  Thought Content:Illogical  Hallucinations: denies  Ideas of Reference:None  Suicidal Thoughts: denies  Homicidal Thoughts: denies   Sensorium  Memory: Immediate Poor; Recent Poor; Remote Poor  Judgment: Impaired  Insight: Shallow   Executive Functions  Concentration: Poor  Attention Span: Poor  Recall: Poor  Fund of Knowledge: Poor  Language: Poor   Psychomotor Activity  Psychomotor Activity: reduced  Musculoskeletal: Strength & Muscle Tone: within normal limits Gait & Station: normal Assets  Assets: Desire for Improvement    Physical Exam: Physical Exam Vitals and nursing note reviewed.    ROS Blood pressure 129/76, pulse 83, temperature 97.9 F (36.6 C), resp. rate 18, height 5\' 10"  (1.778 m), weight 117.9 kg, SpO2 100%. Body mass index is 37.31 kg/m.  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  Patient is paralyzed for further safety evaluation and stabilization    Treatment  Plan Summary:   Safety and Monitoring:             -- Voluntary admission to inpatient psychiatric unit for safety, stabilization and treatment             -- Daily contact with patient to assess and evaluate symptoms and progress in treatment             -- Patient's case to be discussed in multi-disciplinary team meeting             -- Observation Level: q15 minute checks             -- Vital signs:  q12 hours             -- Precautions: suicide, elopement, and assault   2. Psychiatric Diagnoses and Treatment:               Lexapro  10 mg daily  zyprexa   2.5 mg QAM and increase to 7.5mg  QHS Ativan  1 mg every 4 hour as needed needed catatonia-started in ED   -- The risks/benefits/side-effects/alternatives to this medication were discussed in detail with the patient and time was given for questions. The patient consents to medication trial.                -- Metabolic profile and EKG monitoring obtained while on an atypical antipsychotic (BMI: Lipid Panel: HbgA1c: QTc:)              -- Encouraged patient to participate in unit milieu and in scheduled group therapies                            3. Medical Issues Being Addressed:  No urgent medical needs identified   4. Discharge Planning:              -- Social work and case management to assist with discharge planning and identification of hospital follow-up needs prior to discharge             --  Estimated LOS: 5-7 days             -- Discharge Concerns: Need to establish a safety plan; Medication compliance and effectiveness             -- Discharge Goals: Return home with outpatient referrals follow ups   Physician Treatment Plan for Primary Diagnosis: MDD (major depressive disorder) Long Term Goal(s): Improvement in symptoms so as ready for discharge   Short Term Goals: Ability to identify changes in lifestyle to reduce recurrence of condition will improve, Ability to verbalize feelings will improve, Ability to disclose and  discuss suicidal ideas, Ability to demonstrate self-control will improve, and Ability to identify and develop effective coping behaviors will improve   Physician Treatment Plan for Secondary Diagnosis: Principal Problem:   MDD (major depressive disorder)   Long Term Goal(s): Improvement in symptoms so as ready for discharge   Short Term Goals: Ability to identify changes in lifestyle to reduce recurrence of condition will improve, Ability to verbalize feelings will improve, Ability to disclose and discuss suicidal ideas, Ability to demonstrate self-control will improve, Ability to identify and develop effective coping behaviors will improve, Ability to maintain clinical measurements within normal limits will improve, and Compliance with prescribed medications will improve  Aurelia Blotter, MD 02/13/2024, 9:02 PM

## 2024-02-13 NOTE — Group Note (Signed)
 Recreation Therapy Group Note   Group Topic:Health and Wellness  Group Date: 02/13/2024 Start Time: 1400 End Time: 1450 Facilitators: Deatrice Factor, LRT, CTRS Location:  Dayroom  Activity Description/Intervention: Therapeutic Drumming. Patients with peers and staff were given the opportunity to engage in a leader facilitated HealthRHYTH[facility] Group Empowerment Drumming Circle with staff from the [employer], in partnership with [employer]. Teaching laboratory technician and trained [employer], Kathlyne Parchment leading with LRT observing and documenting intervention and pt response. This evidenced-based practice targets 7 areas of health and wellbeing in the human experience including: stress-reduction, exercise, self-expression, camaraderie/support, nurturing, spirituality, and music-making (leisure).    Goal Area(s) Addresses:  Patient will engage in pro-social way in music group.  Patient will follow directions of drum leader on the first prompt. Patient will demonstrate no behavioral issues during group.  Patient will identify if a reduction in stress level occurs as a result of participation in therapeutic drum circle.     Education: Leisure exposure, Pharmacologist, Musical expression, Discharge Planning   Affect/Mood: Appropriate   Participation Level: Active   Participation Quality: Independent   Behavior: Appropriate   Speech/Thought Process: Coherent   Insight: Good   Judgement: Good   Modes of Intervention: Music   Patient Response to Interventions:  Receptive   Education Outcome:  Acknowledges education   Clinical Observations/Individualized Feedback: Saylor was active in their participation of session activities and group discussion. Pt interacted well with LRT and peers duration of session.    Plan: Continue to engage patient in RT group sessions 2-3x/week.   Deatrice Factor, LRT, CTRS 02/13/2024 4:20 PM

## 2024-02-13 NOTE — Group Note (Signed)
 Date:  02/13/2024 Time:  10:46 AM  Group Topic/Focus:  Goals/meditation Group:   The focus of this group is to help patients establish daily goals to achieve during treatment and discuss how the patient can incorporate goal setting into their daily lives to aide in recovery.    Participation Level:  None  Participation Quality:  Sharing  Affect:  Appropriate  Cognitive:  Appropriate  Insight: Appropriate  Engagement in Group:  Limited  Modes of Intervention:  Activity and Discussion  Additional Comments:    [name] 02/13/2024, 10:46 AM

## 2024-02-13 NOTE — Progress Notes (Signed)
   02/12/24 2030  Psych Admission Type (Psych Patients Only)  Admission Status Voluntary  Psychosocial Assessment  Patient Complaints Anxiety;Depression;Other (Comment) (auditory hallucinations)  Eye Contact Brief  Facial Expression Flat  Affect Flat;Depressed  Speech Slow;Soft  Interaction Minimal  Motor Activity Slow  Appearance/Hygiene In scrubs  Behavior Characteristics Cooperative;Anxious  Mood Depressed;Anxious  Thought Process  Content WDL  Delusions None reported or observed  Perception Hallucinations  Hallucination Auditory  Judgment Impaired  Confusion Mild  Danger to Self  Current suicidal ideation? Denies  Agreement Not to Harm Self Yes  Description of Agreement verbal  Danger to Others  Danger to Others None reported or observed   Progress note   D: Pt seen in dayroom with peers but not interacting with them. Pt denies SI, HI, VH. Endorses hearing AH of chatter. Pt rates pain  0/10. Pt rates anxiety  6/10 and depression  6/10. Pt still with delayed but appropriate responses. Eating well. Had hard time sleeping last night. PRN will be given tonight if pt agrees. Pt states that his constipation has resolved. LBM was 02/11/24. Pt did go outside for recreation group. No other concerns noted at this time.  A: Pt provided support and encouragement. Pt given scheduled medication as prescribed. PRNs as appropriate. Q15 min checks for safety.   R: Pt safe on the unit. Will continue to monitor.

## 2024-02-13 NOTE — Plan of Care (Signed)
  Problem: Activity: Goal: Interest or engagement in activities will improve Outcome: Progressing Goal: Sleeping patterns will improve Outcome: Progressing   Problem: Coping: Goal: Ability to demonstrate self-control will improve Outcome: Progressing   

## 2024-02-14 DIAGNOSIS — F331 Major depressive disorder, recurrent, moderate: Secondary | ICD-10-CM | POA: Diagnosis not present

## 2024-02-14 NOTE — Progress Notes (Signed)
   02/14/24 0600  15 Minute Checks  Location Bedroom  Visual Appearance Calm  Behavior Sleeping  Sleep (Behavioral Health Patients Only)  Calculate sleep? (Click Yes once per 24 hr at 0600 safety check) Yes  Documented sleep last 24 hours 9.5

## 2024-02-14 NOTE — Progress Notes (Signed)
   02/14/24 1930  Psych Admission Type (Psych Patients Only)  Admission Status Voluntary  Psychosocial Assessment  Patient Complaints Anxiety  Eye Contact Brief  Facial Expression Flat  Affect Flat  Speech Soft;Slow  Interaction Minimal  Motor Activity Slow  Appearance/Hygiene In scrubs  Behavior Characteristics Cooperative  Mood Sad  Thought Process  Coherency WDL  Content WDL  Delusions None reported or observed  Perception Hallucinations  Hallucination None reported or observed (says medication is working)  Landscape architect Limited  Confusion Mild  Danger to Self  Current suicidal ideation? Denies  Danger to Others  Danger to Others None reported or observed   Progress note   D: Pt seen sitting in dayroom but not interacting with peers. Pt denies SI, HI, AVH. Says medication is helping with the voices and the shadows. Pt rates pain  0/10. Pt rates anxiety  5/10 and depression  0/10. Pt will participate in groups and activities when asked but no initiation. Says he is not sure if he is going back to the group home or not. Confused about his situation. Says sleep is okay. Eating well. No other concerns noted at this time.  A: Pt provided support and encouragement. Pt given scheduled medication as prescribed. PRNs as appropriate. Q15 min checks for safety.   R: Pt safe on the unit. Will continue to monitor.

## 2024-02-14 NOTE — Progress Notes (Signed)
   02/13/24 2114  Psych Admission Type (Psych Patients Only)  Admission Status Voluntary  Psychosocial Assessment  Patient Complaints Depression  Eye Contact Brief  Facial Expression Flat;Sad  Affect Sad  Speech Soft;Slow  Interaction Minimal  Motor Activity Slow  Appearance/Hygiene In scrubs  Behavior Characteristics Cooperative  Mood Sad;Pleasant  Aggressive Behavior  Effect No apparent injury  Thought Process  Coherency WDL  Content WDL  Delusions None reported or observed  Perception Hallucinations  Hallucination None reported or observed;Auditory  Judgment Impaired  Confusion Mild  Danger to Self  Current suicidal ideation? Denies  Agreement Not to Harm Self Yes  Description of Agreement Verbal  Danger to Others  Danger to Others None reported or observed

## 2024-02-14 NOTE — Group Note (Signed)
 Physical/Occupational Therapy Group Note  Group Topic: Transfer Training   Group Date: 02/14/2024 Start Time: 1300 End Time: 1330 Facilitators: Marlowe Lawes, Otelia Blew, PT   Group Description: Group educated on sequence and techniques to maximize safety with functional transfers.  Additionally, integrated education on impact of seating surfaces, use of assistive device and management of orthostasis with movement transitions.  Patients actively engaged with functional transfers (sit/stand) from various seating surfaces, with and without assist devices, working to integrate and retain education provided during session.  Allowed time for questions and further discussion on mobility concerns/needs.   Therapeutic Goal(s):  Identify and demonstrate safe technique for sit/stand transfers from various seating surfaces. Identify and demonstrate safe use of assistive devices with basic transfers and simple mobility. Identify and demonstrate ability to recognize signs/symptoms of orthostasis and appropriate compensatory/safety techniques.  Individual Participation: Pt appropriate and quietly attentive participating throughout the activity portions of the session but only minimally in discussion.   Participation Level: Moderate   Participation Quality: Minimal Cues   Behavior: Appropriate   Speech/Thought Process: Relevant   Affect/Mood: Appropriate and Flat   Insight: Good   Judgement: Good   Individualization: Per above   Modes of Intervention: Activity, Discussion, and Education  Patient Response to Interventions:  Attentive, Engaged, Interested , and Receptive   Plan: Continue to engage patient in PT/OT groups 1 - 2x/week.  Lavenia Post PT, DPT 02/14/24, 1:48 PM

## 2024-02-14 NOTE — Plan of Care (Signed)
   Problem: Education: Goal: Emotional status will improve Outcome: Progressing Goal: Mental status will improve Outcome: Progressing Goal: Verbalization of understanding the information provided will improve Outcome: Progressing

## 2024-02-14 NOTE — Group Note (Signed)
 Date:  02/14/2024 Time:  1:32 AM  Group Topic/Focus:  Self Care:   The focus of this group is to help patients understand the importance of self-care in order to improve or restore emotional, physical, spiritual, interpersonal, and financial health.    Participation Level:  Active  Participation Quality:  Appropriate  Affect:  Appropriate  Cognitive:  Appropriate  Insight: Appropriate  Engagement in Group:  Engaged  Modes of Intervention:  Education  Additional Comments:    Sherlie Distance 02/14/2024, 1:32 AM

## 2024-02-14 NOTE — Group Note (Signed)
 Date:  02/14/2024 Time:  11:04 AM  Group Topic/Focus:  Healthy Communication:   The focus of this group is to discuss communication, barriers to communication, as well as healthy ways to communicate with others.    Participation Level:  Did Not Attend   Marianna Shirk Anjolaoluwa Siguenza 02/14/2024, 11:04 AM

## 2024-02-14 NOTE — Progress Notes (Signed)
   02/14/24 0931  Psych Admission Type (Psych Patients Only)  Admission Status Voluntary  Psychosocial Assessment  Patient Complaints Depression  Eye Contact Brief  Facial Expression Flat  Affect Sad  Speech Soft;Slow  Interaction Minimal  Motor Activity Slow  Appearance/Hygiene In scrubs  Behavior Characteristics Cooperative  Mood Sad  Thought Process  Coherency WDL  Content WDL  Delusions None reported or observed  Perception Hallucinations  Hallucination None reported or observed  Judgment Impaired  Confusion Mild  Danger to Self  Current suicidal ideation? Denies  Agreement Not to Harm Self Yes  Description of Agreement Verbal  Danger to Others  Danger to Others None reported or observed

## 2024-02-14 NOTE — Group Note (Signed)
 Date:  02/14/2024 Time:  6:10 PM  Group Topic/Focus:  Making Healthy Choices:   The focus of this group is to help patients identify negative/unhealthy choices they were using prior to admission and identify positive/healthier coping strategies to replace them upon discharge.    Participation Level:  Active  Participation Quality:  Appropriate  Affect:  Appropriate  Cognitive:  Appropriate  Insight: Appropriate  Engagement in Group:  Engaged  Modes of Intervention: Discussion  [name] 02/14/2024, 6:10 PM

## 2024-02-14 NOTE — Group Note (Signed)
 Date:  02/14/2024 Time:  9:41 PM  Group Topic/Focus:  Wrap-Up Group:   The focus of this group is to help patients review their daily goal of treatment and discuss progress on daily workbooks.    Participation Level:  Active  Participation Quality:  Appropriate  Affect:  Appropriate  Cognitive:  Appropriate  Insight: Appropriate  Engagement in Group:  Engaged  Modes of Intervention:  Discussion  Additional Comments:    Caleb Leblanc 02/14/2024, 9:41 PM

## 2024-02-14 NOTE — Plan of Care (Signed)
  Problem: Education: Goal: Knowledge of [facility] Education information/materials will improve Outcome: Progressing Goal: Emotional status will improve Outcome: Progressing Goal: Mental status will improve Outcome: Progressing Goal: Verbalization of understanding the information provided will improve Outcome: Progressing   Problem: Activity: Goal: Interest or engagement in activities will improve Outcome: Progressing Goal: Sleeping patterns will improve Outcome: Progressing   Problem: Health Behavior/Discharge Planning: Goal: Identification of resources available to assist in meeting health care needs will improve Outcome: Progressing Goal: Compliance with treatment plan for underlying cause of condition will improve Outcome: Progressing   Problem: Safety: Goal: Periods of time without injury will increase Outcome: Progressing

## 2024-02-14 NOTE — Progress Notes (Signed)
 [facility] MD Progress Note  02/14/2024 2:08 PM Caleb Leblanc  MRN:  [id number]  Caleb Leblanc is a 63 y.o. male admitted: Presented to the ED on 01/31/2024  5:11 PM for altered mental status. He carries the psychiatric diagnoses of depression and has a past medical history of catatonia, OSA, constipation. His current presentation of despondence, hopeless, suicidal thoughts, flat affect is most consistent with depression.   Subjective:  Chart reviewed, case discussed in multidisciplinary meeting, patient seen during rounds.  Patient is noted to be checking on his menu for breakfast in the morning.  He reports doing fine and denies active suicidal/homicidal ideation/plan.  He reports feeling tired and having hard time waking up in the morning.  He reports auditory hallucinations improved tremendously with medication adjustment.  He reports feeling anxious about his inability to remember things, inability to manage his finances in the community.  Per LCSW APS has been reached out to assess the patient for need of guardian.   Sleep: Fair  Appetite:  Fair  Past Psychiatric History: see h&P Family History: History reviewed. No pertinent family history. Social History:  Social History   Substance and Sexual Activity  Alcohol  Use Not Currently     Social History   Substance and Sexual Activity  Drug Use Never    Social History   Socioeconomic History   Marital status: Widowed    Spouse name: Not on file   Number of children: Not on file   Years of education: Not on file   Highest education level: Not on file  Occupational History   Not on file  Tobacco Use   Smoking status: Former    Current packs/day: 0.00    Types: Cigarettes    Quit date: 09/09/2017    Years since quitting: 6.4   Smokeless tobacco: Never   Tobacco comments:    pt states he smoked 4 cigs/day before he quit  Vaping Use   Vaping status: Never Used  Substance and Sexual Activity   Alcohol  use: Not Currently   Drug  use: Never   Sexual activity: Not on file  Other Topics Concern   Not on file  Social History Narrative   Not on file   Social Drivers of Health   Financial Resource Strain: Not on file  Food Insecurity: Food Insecurity Present (02/04/2024)   Hunger Vital Sign    Worried About Running Out of Food in the Last Year: Often true    Ran Out of Food in the Last Year: Often true  Transportation Needs: Unmet Transportation Needs (02/04/2024)   PRAPARE - Administrator, Civil Service (Medical): Yes    Lack of Transportation (Non-Medical): Yes  Physical Activity: Not on file  Stress: Not on file  Social Connections: Socially Isolated (01/18/2024)   Social Connection and Isolation Panel [NHANES]    Frequency of Communication with Friends and Family: Never    Frequency of Social Gatherings with Friends and Family: Never    Attends Religious Services: Never    Database administrator or Organizations: No    Attends Engineer, structural: Never    Marital Status: Never married   Past Medical History:  Past Medical History:  Diagnosis Date   Hypertension     Past Surgical History:  Procedure Laterality Date   ABDOMINAL SURGERY      Current Medications: Current Facility-Administered Medications  Medication Dose Route Frequency Provider Last Rate Last Admin   acetaminophen  (TYLENOL ) tablet 650  mg  650 mg Oral Q6H PRN Motley-Mangrum, Jadeka A, PMHNP       alum & mag hydroxide-simeth (MAALOX/MYLANTA) 200-200-20 MG/5ML suspension 30 mL  30 mL Oral Q4H PRN Motley-Mangrum, Jadeka A, PMHNP       amLODipine  (NORVASC ) tablet 10 mg  10 mg Oral Daily Motley-Mangrum, Jadeka A, PMHNP   10 mg at 02/13/24 1328   haloperidol  (HALDOL ) tablet 5 mg  5 mg Oral TID PRN Motley-Mangrum, Jadeka A, PMHNP       And   diphenhydrAMINE  (BENADRYL ) capsule 50 mg  50 mg Oral TID PRN Motley-Mangrum, Jadeka A, PMHNP       haloperidol  lactate (HALDOL ) injection 5 mg  5 mg Intramuscular TID PRN  Motley-Mangrum, Jadeka A, PMHNP       And   diphenhydrAMINE  (BENADRYL ) injection 50 mg  50 mg Intramuscular TID PRN Motley-Mangrum, Jadeka A, PMHNP       And   LORazepam  (ATIVAN ) injection 2 mg  2 mg Intramuscular TID PRN Motley-Mangrum, Jadeka A, PMHNP       haloperidol  lactate (HALDOL ) injection 10 mg  10 mg Intramuscular TID PRN Motley-Mangrum, Jadeka A, PMHNP       And   diphenhydrAMINE  (BENADRYL ) injection 50 mg  50 mg Intramuscular TID PRN Motley-Mangrum, Jadeka A, PMHNP       And   LORazepam  (ATIVAN ) injection 2 mg  2 mg Intramuscular TID PRN Motley-Mangrum, Jadeka A, PMHNP       escitalopram  (LEXAPRO ) tablet 10 mg  10 mg Oral Daily Kameisha Malicki, MD   10 mg at 02/14/24 0931   hydrOXYzine  (ATARAX ) tablet 25 mg  25 mg Oral TID PRN Motley-Mangrum, Jadeka A, PMHNP       influenza vac split trivalent PF (FLULAVAL) injection 0.5 mL  0.5 mL Intramuscular Tomorrow-1000 Aurelia Blotter, MD       lactulose (CHRONULAC) 10 GM/15ML solution 10 g  10 g Oral BID Lyriq Finerty, MD   10 g at 02/14/24 0932   LORazepam  (ATIVAN ) tablet 1 mg  1 mg Oral Q4H PRN Motley-Mangrum, Jadeka A, PMHNP       magnesium  hydroxide (MILK OF MAGNESIA) suspension 30 mL  30 mL Oral Daily PRN Motley-Mangrum, Jadeka A, PMHNP       OLANZapine  (ZYPREXA ) tablet 2.5 mg  2.5 mg Oral q AM Natayah Warmack, MD   2.5 mg at 02/13/24 1106   OLANZapine  (ZYPREXA ) tablet 7.5 mg  7.5 mg Oral QHS Oral Hallgren, MD   7.5 mg at 02/13/24 2122   traZODone  (DESYREL ) tablet 50 mg  50 mg Oral QHS PRN Motley-Mangrum, Jadeka A, PMHNP   50 mg at 02/13/24 2125    Lab Results:  No results found for this or any previous visit (from the past 48 hours).   Blood Alcohol  level:  Lab Results  Component Value Date   [facility] <15 01/31/2024   ETH <10 01/29/2024    Metabolic Disorder Labs: Lab Results  Component Value Date   HGBA1C 6.0 (H) 02/07/2024   MPG 125.5 02/07/2024   No results found for: "PROLACTIN" Lab Results  Component Value Date    CHOL 121 02/07/2024   TRIG 64 02/07/2024   HDL 39 (L) 02/07/2024   CHOLHDL 3.1 02/07/2024   VLDL 13 02/07/2024   LDLCALC 69 02/07/2024    Physical Findings: AIMS:  , ,  ,  ,    CIWA:    COWS:      Psychiatric Specialty Exam:  Presentation  General Appearance:  Disheveled  Eye Contact: Minimal  Speech: Slow  Speech Volume: Decreased    Mood and Affect  Mood: Dysphoric  Affect: Depressed   Thought Process  Thought Processes: Irrevelant (impoverished)  Descriptions of Associations:Intact  Orientation:Partial  Thought Content:Illogical  Hallucinations: denies  Ideas of Reference:None  Suicidal Thoughts: denies  Homicidal Thoughts: denies   Sensorium  Memory: Immediate Poor; Recent Poor; Remote Poor  Judgment: Impaired  Insight: Shallow   Executive Functions  Concentration: Poor  Attention Span: Poor  Recall: Poor  Fund of Knowledge: Poor  Language: Poor   Psychomotor Activity  Psychomotor Activity: reduced  Musculoskeletal: Strength & Muscle Tone: within normal limits Gait & Station: normal Assets  Assets: Desire for Improvement    Physical Exam: Physical Exam Vitals and nursing note reviewed.    Review of Systems  Constitutional: Negative.   Respiratory: Negative.     Blood pressure (!) 104/50, pulse 78, temperature 98.2 F (36.8 C), resp. rate 18, height 5\' 10"  (1.778 m), weight 117.9 kg, SpO2 96%. Body mass index is 37.31 kg/m.  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  Patient is admitted for further safety evaluation and stabilization. Patient is displaying cognitive problems, unable to give any information on his medical hx, or psychiatric hx, needing assistance with  his ADLs. Will do PT/OT consults to evaluate his needs in the community.    Treatment Plan Summary:   Safety and Monitoring:             -- Voluntary admission to inpatient psychiatric unit for safety, stabilization and treatment             -- Daily contact with patient to assess and evaluate symptoms and progress in treatment             -- Patient's case to be discussed in multi-disciplinary team meeting             -- Observation Level: q15 minute checks             -- Vital signs:  q12 hours             -- Precautions: suicide, elopement, and assault   2. Psychiatric Diagnoses and Treatment:               Lexapro  10 mg daily  zyprexa   2.5 mg QAM and increase to 7.5mg  QHS Ativan  1 mg every 4 hour as needed needed catatonia-started in ED   -- The risks/benefits/side-effects/alternatives to this medication were discussed in detail with the patient and time was given for questions. The patient consents to medication trial.                -- Metabolic profile and EKG monitoring obtained while on an atypical antipsychotic (BMI: Lipid Panel: HbgA1c: QTc:)              -- Encouraged patient to participate in unit milieu and in scheduled group therapies                            3. Medical Issues Being Addressed:  No urgent medical needs identified   4. Discharge Planning: APS has been called to evaluate the need for guardianship as patient shows great difficulty in doing his ADLS, unable to recall  and manage his medical problems and psychiatric problems.             -- Social work and case management to assist with discharge planning and identification of hospital follow-up needs prior to discharge             -- Estimated LOS: 5-7 days             -- Discharge Concerns: Need to establish a safety plan; Medication compliance and effectiveness             -- Discharge Goals: Return home with outpatient referrals follow ups   Physician Treatment Plan for Primary Diagnosis: MDD (major  depressive disorder) Long Term Goal(s): Improvement in symptoms so as ready for discharge   Short Term Goals: Ability to identify changes in lifestyle to reduce recurrence of condition will improve, Ability to verbalize feelings will improve, Ability to disclose and discuss suicidal ideas, Ability to demonstrate self-control will improve, and Ability to identify and develop effective coping behaviors will improve   Physician Treatment Plan for Secondary Diagnosis: Principal Problem:   MDD (major depressive disorder)   Long Term Goal(s): Improvement in symptoms so as ready for discharge   Short Term Goals: Ability to identify changes in lifestyle to reduce recurrence of condition will improve, Ability to verbalize feelings will improve, Ability to disclose and discuss suicidal ideas, Ability to demonstrate self-control will improve, Ability to identify and develop effective coping behaviors will improve, Ability to maintain clinical measurements within normal limits will improve, and Compliance with prescribed medications will improve  Aurelia Blotter, MD 02/14/2024, 2:08 PM

## 2024-02-15 DIAGNOSIS — F331 Major depressive disorder, recurrent, moderate: Secondary | ICD-10-CM | POA: Diagnosis not present

## 2024-02-15 NOTE — [facility] Counselor (Signed)
 CSW sat in the room with pt and APS caseworker, Kristian Petty.   At conclusion of assessment, Caleb Leblanc requests that CSW send pt's H&P and current medications.   CSW sent requested records to [email or web address]  Derrill Flirt, MSW, [facility] 02/15/2024 3:24 PM

## 2024-02-15 NOTE — Progress Notes (Signed)
 [name] Progress Note  02/15/2024 3:09 PM Caleb Leblanc  MRN:  [id number]  Caleb Leblanc is a 63 y.o. male admitted: Presented to the ED on 01/31/2024  5:11 PM for altered mental status. He carries the psychiatric diagnoses of depression and has a past medical history of catatonia, OSA, constipation. His current presentation of despondence, hopeless, suicidal thoughts, flat affect is most consistent with depression.   Subjective:  Chart reviewed, case discussed in multidisciplinary meeting, patient seen during rounds.  Patient is noted to be sitting in the day area.  He reports he completed his breakfast today.  He denies feeling depressed or anxious.  He denies having any side effects to medication.  He has fair appetite and sleep reportedly.  He denies SI/HI/plan.  Provider and patient discussed about transition to outpatient.  He understands that he will be provided a list of resources for shelters where he will transition to after we hear from APS about their involvement on not.  Sleep: Fair  Appetite:  Fair  Past Psychiatric History: see h&P Family History: History reviewed. No pertinent family history. Social History:  Social History   Substance and Sexual Activity  Alcohol  Use Not Currently     Social History   Substance and Sexual Activity  Drug Use Never    Social History   Socioeconomic History   Marital status: Widowed    Spouse name: Not on file   Number of children: Not on file   Years of education: Not on file   Highest education level: Not on file  Occupational History   Not on file  Tobacco Use   Smoking status: Former    Current packs/day: 0.00    Types: Cigarettes    Quit date: 09/09/2017    Years since quitting: 6.4   Smokeless tobacco: Never   Tobacco comments:    pt states he smoked 4 cigs/day before he quit  Vaping Use   Vaping status: Never Used  Substance and Sexual Activity   Alcohol  use: Not Currently   Drug use: Never   Sexual activity: Not  on file  Other Topics Concern   Not on file  Social History Narrative   Not on file   Social Drivers of Health   Financial Resource Strain: Not on file  Food Insecurity: Food Insecurity Present (02/04/2024)   Hunger Vital Sign    Worried About Running Out of Food in the Last Year: Often true    Ran Out of Food in the Last Year: Often true  Transportation Needs: Unmet Transportation Needs (02/04/2024)   PRAPARE - Administrator, Civil Service (Medical): Yes    Lack of Transportation (Non-Medical): Yes  Physical Activity: Not on file  Stress: Not on file  Social Connections: Socially Isolated (01/18/2024)   Social Connection and Isolation Panel [NHANES]    Frequency of Communication with Friends and Family: Never    Frequency of Social Gatherings with Friends and Family: Never    Attends Religious Services: Never    Database administrator or Organizations: No    Attends Engineer, structural: Never    Marital Status: Never married   Past Medical History:  Past Medical History:  Diagnosis Date   Hypertension     Past Surgical History:  Procedure Laterality Date   ABDOMINAL SURGERY      Current Medications: Current Facility-Administered Medications  Medication Dose Route Frequency Provider Last Rate Last Admin   acetaminophen  (TYLENOL ) tablet 650  mg  650 mg Oral Q6H PRN Motley-Mangrum, Jadeka A, PMHNP       alum & mag hydroxide-simeth (MAALOX/MYLANTA) 200-200-20 MG/5ML suspension 30 mL  30 mL Oral Q4H PRN Motley-Mangrum, Jadeka A, PMHNP       amLODipine  (NORVASC ) tablet 10 mg  10 mg Oral Daily Motley-Mangrum, Jadeka A, PMHNP   10 mg at 02/15/24 1045   haloperidol  (HALDOL ) tablet 5 mg  5 mg Oral TID PRN Motley-Mangrum, Jadeka A, PMHNP       And   diphenhydrAMINE  (BENADRYL ) capsule 50 mg  50 mg Oral TID PRN Motley-Mangrum, Jadeka A, PMHNP       haloperidol  lactate (HALDOL ) injection 5 mg  5 mg Intramuscular TID PRN Motley-Mangrum, Jadeka A, PMHNP       And    diphenhydrAMINE  (BENADRYL ) injection 50 mg  50 mg Intramuscular TID PRN Motley-Mangrum, Jadeka A, PMHNP       And   LORazepam  (ATIVAN ) injection 2 mg  2 mg Intramuscular TID PRN Motley-Mangrum, Jadeka A, PMHNP       haloperidol  lactate (HALDOL ) injection 10 mg  10 mg Intramuscular TID PRN Motley-Mangrum, Jadeka A, PMHNP       And   diphenhydrAMINE  (BENADRYL ) injection 50 mg  50 mg Intramuscular TID PRN Motley-Mangrum, Jadeka A, PMHNP       And   LORazepam  (ATIVAN ) injection 2 mg  2 mg Intramuscular TID PRN Motley-Mangrum, Jadeka A, PMHNP       escitalopram  (LEXAPRO ) tablet 10 mg  10 mg Oral Daily Brittnae Aschenbrenner, MD   10 mg at 02/15/24 1045   hydrOXYzine  (ATARAX ) tablet 25 mg  25 mg Oral TID PRN Motley-Mangrum, Jadeka A, PMHNP       influenza vac split trivalent PF (FLULAVAL) injection 0.5 mL  0.5 mL Intramuscular Tomorrow-1000 Aurelia Blotter, MD       lactulose (CHRONULAC) 10 GM/15ML solution 10 g  10 g Oral BID Cypress Hinkson, MD   10 g at 02/15/24 1044   LORazepam  (ATIVAN ) tablet 1 mg  1 mg Oral Q4H PRN Motley-Mangrum, Jadeka A, PMHNP       magnesium  hydroxide (MILK OF MAGNESIA) suspension 30 mL  30 mL Oral Daily PRN Motley-Mangrum, Jadeka A, PMHNP       OLANZapine  (ZYPREXA ) tablet 2.5 mg  2.5 mg Oral q AM Christabelle Hanzlik, MD   2.5 mg at 02/15/24 1045   OLANZapine  (ZYPREXA ) tablet 7.5 mg  7.5 mg Oral QHS Carole Doner, MD   7.5 mg at 02/14/24 2054   traZODone  (DESYREL ) tablet 50 mg  50 mg Oral QHS PRN Motley-Mangrum, Jadeka A, PMHNP   50 mg at 02/14/24 2054    Lab Results:  No results found for this or any previous visit (from the past 48 hours).   Blood Alcohol  level:  Lab Results  Component Value Date   [facility] <15 01/31/2024   ETH <10 01/29/2024    Metabolic Disorder Labs: Lab Results  Component Value Date   HGBA1C 6.0 (H) 02/07/2024   MPG 125.5 02/07/2024   No results found for: "PROLACTIN" Lab Results  Component Value Date   CHOL 121 02/07/2024   TRIG 64 02/07/2024    HDL 39 (L) 02/07/2024   CHOLHDL 3.1 02/07/2024   VLDL 13 02/07/2024   LDLCALC 69 02/07/2024    Physical Findings: AIMS:  , ,  ,  ,    CIWA:    COWS:      Psychiatric Specialty Exam:  Presentation  General Appearance:  Disheveled  Eye Contact: Minimal  Speech: Slow  Speech Volume: Decreased    Mood and Affect  Mood: Dysphoric  Affect: Depressed   Thought Process  Thought Processes: Irrevelant (impoverished)  Descriptions of Associations:Intact  Orientation:Partial  Thought Content:Illogical  Hallucinations: denies  Ideas of Reference:None  Suicidal Thoughts: denies  Homicidal Thoughts: denies   Sensorium  Memory: Immediate Poor; Recent Poor; Remote Poor  Judgment: Impaired  Insight: Shallow   Executive Functions  Concentration: Poor  Attention Span: Poor  Recall: Poor  Fund of Knowledge: Poor  Language: Poor   Psychomotor Activity  Psychomotor Activity: reduced  Musculoskeletal: Strength & Muscle Tone: within normal limits Gait & Station: normal Assets  Assets: Desire for Improvement    Physical Exam: Physical Exam Vitals and nursing note reviewed.    Review of Systems  Constitutional: Negative.   Respiratory: Negative.     Blood pressure (!) 126/56, pulse 74, temperature 98.2 F (36.8 C), resp. rate 18, height 5\' 10"  (1.778 m), weight 117.9 kg, SpO2 93%. Body mass index is 37.31 kg/m.  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  Patient is admitted for further safety evaluation and stabilization. Patient is displaying cognitive problems, unable to give any information on his medical hx, or psychiatric hx, needing assistance with his ADLs. Will do PT/OT consults to evaluate  his needs in the community.    Treatment Plan Summary:   Safety and Monitoring:             -- Voluntary admission to inpatient psychiatric unit for safety, stabilization and treatment             -- Daily contact with patient to assess and evaluate symptoms and progress in treatment             -- Patient's case to be discussed in multi-disciplinary team meeting             -- Observation Level: q15 minute checks             -- Vital signs:  q12 hours             -- Precautions: suicide, elopement, and assault   2. Psychiatric Diagnoses and Treatment:               Lexapro  10 mg daily  zyprexa   2.5 mg QAM and increase to 7.5mg  QHS Ativan  1 mg every 4 hour as needed needed catatonia-started in ED   -- The risks/benefits/side-effects/alternatives to this medication were discussed in detail with the patient and time was given for questions. The patient consents to medication trial.                -- Metabolic profile and EKG monitoring obtained while on an atypical antipsychotic (BMI: Lipid Panel: HbgA1c: QTc:)              -- Encouraged patient to participate in unit milieu and in scheduled group therapies                            3. Medical Issues Being Addressed:  No urgent medical needs identified   4. Discharge Planning: APS has been called to evaluate the need for guardianship as patient shows great difficulty in doing his ADLS, unable to recall  and manage his medical problems and psychiatric problems.             -- Social work and case management to assist with discharge planning and identification of hospital follow-up needs prior to discharge             -- Estimated LOS: 5-7 days             -- Discharge Concerns: Need to establish a safety plan; Medication compliance and effectiveness             -- Discharge Goals: Return home with outpatient referrals follow ups   Physician Treatment Plan for Primary Diagnosis: MDD (major depressive disorder) Long Term Goal(s):  Improvement in symptoms so as ready for discharge   Short Term Goals: Ability to identify changes in lifestyle to reduce recurrence of condition will improve, Ability to verbalize feelings will improve, Ability to disclose and discuss suicidal ideas, Ability to demonstrate self-control will improve, and Ability to identify and develop effective coping behaviors will improve   Physician Treatment Plan for Secondary Diagnosis: Principal Problem:   MDD (major depressive disorder)   Long Term Goal(s): Improvement in symptoms so as ready for discharge   Short Term Goals: Ability to identify changes in lifestyle to reduce recurrence of condition will improve, Ability to verbalize feelings will improve, Ability to disclose and discuss suicidal ideas, Ability to demonstrate self-control will improve, Ability to identify and develop effective coping behaviors will improve, Ability to maintain clinical measurements within normal limits will improve, and Compliance with prescribed medications will improve  Aurelia Blotter, MD 02/15/2024, 3:09 PM

## 2024-02-15 NOTE — BH IP Treatment Plan (Signed)
 Interdisciplinary Treatment and Diagnostic Plan Update  02/15/2024 Time of Session: 9:30 AM  Caleb Leblanc MRN: [id number]  Principal Diagnosis: MDD (major depressive disorder)  Secondary Diagnoses: Principal Problem:   MDD (major depressive disorder)   Current Medications:  Current Facility-Administered Medications  Medication Dose Route Frequency Provider Last Rate Last Admin   acetaminophen  (TYLENOL ) tablet 650 mg  650 mg Oral Q6H PRN Motley-Mangrum, Jadeka A, PMHNP       alum & mag hydroxide-simeth (MAALOX/MYLANTA) 200-200-20 MG/5ML suspension 30 mL  30 mL Oral Q4H PRN Motley-Mangrum, Jadeka A, PMHNP       amLODipine  (NORVASC ) tablet 10 mg  10 mg Oral Daily Motley-Mangrum, Jadeka A, PMHNP   10 mg at 02/15/24 1045   haloperidol  (HALDOL ) tablet 5 mg  5 mg Oral TID PRN Motley-Mangrum, Jadeka A, PMHNP       And   diphenhydrAMINE  (BENADRYL ) capsule 50 mg  50 mg Oral TID PRN Motley-Mangrum, Jadeka A, PMHNP       haloperidol  lactate (HALDOL ) injection 5 mg  5 mg Intramuscular TID PRN Motley-Mangrum, Jadeka A, PMHNP       And   diphenhydrAMINE  (BENADRYL ) injection 50 mg  50 mg Intramuscular TID PRN Motley-Mangrum, Jadeka A, PMHNP       And   LORazepam  (ATIVAN ) injection 2 mg  2 mg Intramuscular TID PRN Motley-Mangrum, Jadeka A, PMHNP       haloperidol  lactate (HALDOL ) injection 10 mg  10 mg Intramuscular TID PRN Motley-Mangrum, Jadeka A, PMHNP       And   diphenhydrAMINE  (BENADRYL ) injection 50 mg  50 mg Intramuscular TID PRN Motley-Mangrum, Jadeka A, PMHNP       And   LORazepam  (ATIVAN ) injection 2 mg  2 mg Intramuscular TID PRN Motley-Mangrum, Jadeka A, PMHNP       escitalopram  (LEXAPRO ) tablet 10 mg  10 mg Oral Daily Jadapalle, Sree, MD   10 mg at 02/15/24 1045   hydrOXYzine  (ATARAX ) tablet 25 mg  25 mg Oral TID PRN Motley-Mangrum, Jadeka A, PMHNP       influenza vac split trivalent PF (FLULAVAL) injection 0.5 mL  0.5 mL Intramuscular Tomorrow-1000 Aurelia Blotter, MD       lactulose  (CHRONULAC) 10 GM/15ML solution 10 g  10 g Oral BID Jadapalle, Sree, MD   10 g at 02/15/24 1044   LORazepam  (ATIVAN ) tablet 1 mg  1 mg Oral Q4H PRN Motley-Mangrum, Jadeka A, PMHNP       magnesium  hydroxide (MILK OF MAGNESIA) suspension 30 mL  30 mL Oral Daily PRN Motley-Mangrum, Jadeka A, PMHNP       OLANZapine  (ZYPREXA ) tablet 2.5 mg  2.5 mg Oral q AM Jadapalle, Sree, MD   2.5 mg at 02/15/24 1045   OLANZapine  (ZYPREXA ) tablet 7.5 mg  7.5 mg Oral QHS Jadapalle, Sree, MD   7.5 mg at 02/14/24 2054   traZODone  (DESYREL ) tablet 50 mg  50 mg Oral QHS PRN Motley-Mangrum, Jadeka A, PMHNP   50 mg at 02/14/24 2054   PTA Medications: Medications Prior to Admission  Medication Sig Dispense Refill Last Dose/Taking   amLODipine  (NORVASC ) 10 MG tablet Take 10 mg by mouth daily. (Patient not taking: Reported on 02/02/2024)      OLANZapine  (ZYPREXA ) 5 MG tablet Take 1 tablet (5 mg total) by mouth at bedtime. (Patient not taking: Reported on 02/02/2024) 30 tablet 0     Patient Stressors: Other: "Life in general." Patient was unable/refused to go into further detail.  Patient Strengths: Other: Patient declines  Treatment Modalities: Medication Management, Group therapy, Case management,  1 to 1 session with clinician, Psychoeducation, Recreational therapy.   Physician Treatment Plan for Primary Diagnosis: MDD (major depressive disorder) Long Term Goal(s): Improvement in symptoms so as ready for discharge   Short Term Goals: Ability to identify changes in lifestyle to reduce recurrence of condition will improve Ability to verbalize feelings will improve Ability to disclose and discuss suicidal ideas Ability to demonstrate self-control will improve Ability to identify and develop effective coping behaviors will improve Ability to maintain clinical measurements within normal limits will improve Compliance with prescribed medications will improve  Medication Management: Evaluate patient's response, side  effects, and tolerance of medication regimen.  Therapeutic Interventions: 1 to 1 sessions, Unit Group sessions and Medication administration.  Evaluation of Outcomes: Progressing  Physician Treatment Plan for Secondary Diagnosis: Principal Problem:   MDD (major depressive disorder)  Long Term Goal(s): Improvement in symptoms so as ready for discharge   Short Term Goals: Ability to identify changes in lifestyle to reduce recurrence of condition will improve Ability to verbalize feelings will improve Ability to disclose and discuss suicidal ideas Ability to demonstrate self-control will improve Ability to identify and develop effective coping behaviors will improve Ability to maintain clinical measurements within normal limits will improve Compliance with prescribed medications will improve     Medication Management: Evaluate patient's response, side effects, and tolerance of medication regimen.  Therapeutic Interventions: 1 to 1 sessions, Unit Group sessions and Medication administration.  Evaluation of Outcomes: Progressing   RN Treatment Plan for Primary Diagnosis: MDD (major depressive disorder) Long Term Goal(s): Knowledge of disease and therapeutic regimen to maintain health will improve  Short Term Goals: Ability to remain free from injury will improve, Ability to verbalize frustration and anger appropriately will improve, Ability to demonstrate self-control, Ability to participate in decision making will improve, Ability to verbalize feelings will improve, Ability to disclose and discuss suicidal ideas, Ability to identify and develop effective coping behaviors will improve, and Compliance with prescribed medications will improve  Medication Management: RN will administer medications as ordered by provider, will assess and evaluate patient's response and provide education to patient for prescribed medication. RN will report any adverse and/or side effects to prescribing  provider.  Therapeutic Interventions: 1 on 1 counseling sessions, Psychoeducation, Medication administration, Evaluate responses to treatment, Monitor vital signs and CBGs as ordered, Perform/monitor CIWA, COWS, AIMS and Fall Risk screenings as ordered, Perform wound care treatments as ordered.  Evaluation of Outcomes: Progressing   LCSW Treatment Plan for Primary Diagnosis: MDD (major depressive disorder) Long Term Goal(s): Safe transition to appropriate next level of care at discharge, Engage patient in therapeutic group addressing interpersonal concerns.  Short Term Goals: Engage patient in aftercare planning with referrals and resources, Increase social support, Increase ability to appropriately verbalize feelings, Increase emotional regulation, Facilitate acceptance of mental health diagnosis and concerns, Facilitate patient progression through stages of change regarding substance use diagnoses and concerns, Identify triggers associated with mental health/substance abuse issues, and Increase skills for wellness and recovery  Therapeutic Interventions: Assess for all discharge needs, 1 to 1 time with Social worker, Explore available resources and support systems, Assess for adequacy in community support network, Educate family and significant other(s) on suicide prevention, Complete Psychosocial Assessment, Interpersonal group therapy.  Evaluation of Outcomes: Progressing   Progress in Treatment: Attending groups: Yes. Participating in groups: Yes. Taking medication as prescribed: Yes. Toleration medication: Yes. Family/Significant other contact made:  No, will contact:  CSW will continue attempts Patient understands diagnosis: No. Discussing patient identified problems/goals with staff: Yes. Medical problems stabilized or resolved: Yes. Denies suicidal/homicidal ideation: Yes. Issues/concerns per patient self-inventory: Yes. Other:    New problem(s) identified: No, Describe:  None  identified. Update 02/10/2024: No changes at this time Update 02/15/2024: No changes at this time    New Short Term/Long Term Goal(s): elimination of symptoms of psychosis, medication management for mood stabilization; elimination of SI thoughts; development of comprehensive mental wellness plan. Update 02/10/2024: No changes at this time. Update 02/15/2024: No changes at this time      Patient Goals:  "I'm here to get to normal status again"  Update 02/10/2024: No changes at this time. Update 02/15/2024: No changes at this time      Discharge Plan or Barriers: CSW will assist with appropriate discharge planning.  Update 02/10/2024: Pt competency concerns. APS referral completed.  Update 02/15/2024: APS came to see pt,  CSW has provided required medical documents per their request     Reason for Continuation of Hospitalization: Medication stabilization   Estimated Length of Stay: 1 to 7 days Update 02/10/24: TBD Update 02/15/2024: TBD    Last 3 [place] Suicide Severity Risk Score: Flowsheet Row Admission (Current) from 02/04/2024 in [facility] ED from 01/31/2024 in [facility] Emergency Department at [facility] ED from 01/29/2024 in [facility] Emergency Department at [facility]  C-SSRS RISK CATEGORY Low Risk No Risk No Risk       Last [facility] 2/9 Scores:     No data to display          Scribe for Treatment Team: Claudio Culver, Milinda Allen 02/15/2024 2:17 PM

## 2024-02-15 NOTE — Group Note (Signed)
 Date:  02/15/2024 Time:  8:38 PM  Group Topic/Focus:  Wrap-Up Group:   The focus of this group is to help patients review their daily goal of treatment and discuss progress on daily workbooks.    Participation Level:  None   Caleb Leblanc 02/15/2024, 8:38 PM

## 2024-02-15 NOTE — Progress Notes (Signed)
   02/15/24 1500  Psych Admission Type (Psych Patients Only)  Admission Status Voluntary  Psychosocial Assessment  Patient Complaints Anxiety  Eye Contact Brief  Facial Expression Flat  Affect Flat  Speech Soft  Interaction Minimal  Motor Activity Slow  Appearance/Hygiene In scrubs  Behavior Characteristics Cooperative  Mood Sad  Thought Process  Coherency WDL  Content WDL  Delusions None reported or observed  Perception Hallucinations  Hallucination None reported or observed  Judgment Limited  Confusion Mild  Danger to Self  Current suicidal ideation? Denies  Agreement Not to Harm Self Yes  Description of Agreement verbal  Danger to Others  Danger to Others None reported or observed

## 2024-02-15 NOTE — Progress Notes (Signed)
   02/15/24 2010  Psych Admission Type (Psych Patients Only)  Admission Status Voluntary  Psychosocial Assessment  Patient Complaints Anxiety;Worrying  Eye Contact Brief  Facial Expression Flat  Affect Flat  Speech Soft;Slow  Interaction Minimal  Motor Activity Slow  Appearance/Hygiene In scrubs  Behavior Characteristics Cooperative;Anxious  Mood Anxious  Thought Process  Coherency WDL  Content WDL  Delusions None reported or observed  Perception Hallucinations  Hallucination None reported or observed  Judgment Limited  Confusion Mild  Danger to Self  Current suicidal ideation? Denies  Danger to Others  Danger to Others None reported or observed   Progress note   D: Pt seen in dayroom. Pt denies Si, HI, AVH. States that medication seems to be helping reduce the hallucinations. Pt rates pain  0/10. Pt endorses anxiety  and denies depression. Says he is not sure if he is returning to his former group home or not. Doesn't know the plan. States he slept well last night and is eating but appetite still not the best. Did not have a bowel movement today. LBM=02/14/24.  No other concerns noted at this time.  A: Pt provided support and encouragement. Pt given scheduled medication as prescribed. PRNs as appropriate. Q15 min checks for safety.   R: Pt safe on the unit. Will continue to monitor.

## 2024-02-15 NOTE — Group Note (Signed)
 Date:  02/15/2024 Time:  4:29 PM  Group Topic/Focus:  Managing Feelings:   The focus of this group is to identify what feelings patients have difficulty handling and develop a plan to handle them in a healthier way upon discharge.    Participation Level:  Active  Participation Quality:  Appropriate  Affect:  Appropriate  Cognitive:  Appropriate  Insight: Appropriate  Engagement in Group:  Engaged  Modes of Intervention:  Discussion   [name] 02/15/2024, 4:29 PM

## 2024-02-15 NOTE — Plan of Care (Signed)
   Problem: Education: Goal: Emotional status will improve Outcome: Progressing Goal: Mental status will improve Outcome: Progressing

## 2024-02-15 NOTE — Plan of Care (Signed)
  Problem: Education: Goal: Emotional status will improve Outcome: Progressing Goal: Mental status will improve Outcome: Progressing Goal: Verbalization of understanding the information provided will improve Outcome: Progressing   Problem: Activity: Goal: Sleeping patterns will improve Outcome: Progressing   Problem: Health Behavior/Discharge Planning: Goal: Compliance with treatment plan for underlying cause of condition will improve Outcome: Progressing   Problem: Safety: Goal: Periods of time without injury will increase Outcome: Progressing

## 2024-02-16 DIAGNOSIS — F331 Major depressive disorder, recurrent, moderate: Secondary | ICD-10-CM | POA: Diagnosis not present

## 2024-02-16 NOTE — Progress Notes (Signed)
   02/16/24 0900  Psych Admission Type (Psych Patients Only)  Admission Status Voluntary  Psychosocial Assessment  Patient Complaints Depression  Eye Contact Brief  Facial Expression Flat  Affect Flat  Speech Soft  Interaction Minimal  Motor Activity Slow  Appearance/Hygiene In scrubs  Behavior Characteristics Cooperative  Mood Sad  Thought Process  Coherency WDL  Content WDL  Delusions None reported or observed  Perception Hallucinations  Hallucination None reported or observed  Judgment Limited  Confusion Mild  Danger to Self  Current suicidal ideation? Denies  Agreement Not to Harm Self Yes  Description of Agreement verbal  Danger to Others  Danger to Others None reported or observed

## 2024-02-16 NOTE — Plan of Care (Signed)
   Problem: Health Behavior/Discharge Planning: Goal: Identification of resources available to assist in meeting health care needs will improve Outcome: Progressing Goal: Compliance with treatment plan for underlying cause of condition will improve Outcome: Progressing

## 2024-02-16 NOTE — Progress Notes (Signed)
 [facility] MD Progress Note  02/16/2024 2:10 PM Caleb Leblanc  [name]  Caleb Leblanc is a 63 y.o. male admitted: Presented to the ED on 01/31/2024  5:11 PM for altered mental status. He carries the psychiatric diagnoses of depression and has a past medical history of catatonia, OSA, constipation. His current presentation of despondence, hopeless, suicidal thoughts, flat affect is most consistent with depression.   Subjective:  Chart reviewed, case discussed in multidisciplinary meeting, patient seen during rounds.  Patient is noted to be sitting in the day area.  Per nursing report patient skipped his breakfast and slept through the day.  Patient offers no complaints and denies SI/HI/intent/plan.  He denies auditory/visual hallucinations.  He reports taking his medications with no reported side effects.  Provider and patient discussed about possible discharge to shelter early next week.  Sleep: Fair  Appetite:  Fair  Past Psychiatric History: see h&P Family History: History reviewed. No pertinent family history. Social History:  Social History   Substance and Sexual Activity  Alcohol  Use Not Currently     Social History   Substance and Sexual Activity  Drug Use Never    Social History   Socioeconomic History   Marital status: Widowed    Spouse name: Not on file   Number of children: Not on file   Years of education: Not on file   Highest education level: Not on file  Occupational History   Not on file  Tobacco Use   Smoking status: Former    Current packs/day: 0.00    Types: Cigarettes    Quit date: 09/09/2017    Years since quitting: 6.4   Smokeless tobacco: Never   Tobacco comments:    pt states he smoked 4 cigs/day before he quit  Vaping Use   Vaping status: Never Used  Substance and Sexual Activity   Alcohol  use: Not Currently   Drug use: Never   Sexual activity: Not on file  Other Topics Concern   Not on file  Social History Narrative   Not on file    Social Drivers of Health   Financial Resource Strain: Not on file  Food Insecurity: Food Insecurity Present (02/04/2024)   Hunger Vital Sign    Worried About Running Out of Food in the Last Year: Often true    Ran Out of Food in the Last Year: Often true  Transportation Needs: Unmet Transportation Needs (02/04/2024)   PRAPARE - Administrator, Civil Service (Medical): Yes    Lack of Transportation (Non-Medical): Yes  Physical Activity: Not on file  Stress: Not on file  Social Connections: Socially Isolated (01/18/2024)   Social Connection and Isolation Panel [NHANES]    Frequency of Communication with Friends and Family: Never    Frequency of Social Gatherings with Friends and Family: Never    Attends Religious Services: Never    Database administrator or Organizations: No    Attends Engineer, structural: Never    Marital Status: Never married   Past Medical History:  Past Medical History:  Diagnosis Date   Hypertension     Past Surgical History:  Procedure Laterality Date   ABDOMINAL SURGERY      Current Medications: Current Facility-Administered Medications  Medication Dose Route Frequency Provider Last Rate Last Admin   acetaminophen  (TYLENOL ) tablet 650 mg  650 mg Oral Q6H PRN Motley-Mangrum, Jadeka A, PMHNP       alum & mag hydroxide-simeth (MAALOX/MYLANTA) 200-200-20 MG/5ML  suspension 30 mL  30 mL Oral Q4H PRN Motley-Mangrum, Jadeka A, PMHNP       amLODipine  (NORVASC ) tablet 10 mg  10 mg Oral Daily Motley-Mangrum, Jadeka A, PMHNP   10 mg at 02/16/24 1145   haloperidol  (HALDOL ) tablet 5 mg  5 mg Oral TID PRN Motley-Mangrum, Jadeka A, PMHNP       And   diphenhydrAMINE  (BENADRYL ) capsule 50 mg  50 mg Oral TID PRN Motley-Mangrum, Jadeka A, PMHNP       haloperidol  lactate (HALDOL ) injection 5 mg  5 mg Intramuscular TID PRN Motley-Mangrum, Jadeka A, PMHNP       And   diphenhydrAMINE  (BENADRYL ) injection 50 mg  50 mg Intramuscular TID PRN Motley-Mangrum,  Jadeka A, PMHNP       And   LORazepam  (ATIVAN ) injection 2 mg  2 mg Intramuscular TID PRN Motley-Mangrum, Jadeka A, PMHNP       haloperidol  lactate (HALDOL ) injection 10 mg  10 mg Intramuscular TID PRN Motley-Mangrum, Jadeka A, PMHNP       And   diphenhydrAMINE  (BENADRYL ) injection 50 mg  50 mg Intramuscular TID PRN Motley-Mangrum, Jadeka A, PMHNP       And   LORazepam  (ATIVAN ) injection 2 mg  2 mg Intramuscular TID PRN Motley-Mangrum, Jadeka A, PMHNP       escitalopram  (LEXAPRO ) tablet 10 mg  10 mg Oral Daily Cashus Halterman, MD   10 mg at 02/16/24 1145   hydrOXYzine  (ATARAX ) tablet 25 mg  25 mg Oral TID PRN Motley-Mangrum, Jadeka A, PMHNP       influenza vac split trivalent PF (FLULAVAL) injection 0.5 mL  0.5 mL Intramuscular Tomorrow-1000 Aurelia Blotter, MD       lactulose  (CHRONULAC ) 10 GM/15ML solution 10 g  10 g Oral BID Manali Mcelmurry, MD   10 g at 02/16/24 1145   LORazepam  (ATIVAN ) tablet 1 mg  1 mg Oral Q4H PRN Motley-Mangrum, Jadeka A, PMHNP       magnesium  hydroxide (MILK OF MAGNESIA) suspension 30 mL  30 mL Oral Daily PRN Motley-Mangrum, Jadeka A, PMHNP       OLANZapine  (ZYPREXA ) tablet 2.5 mg  2.5 mg Oral q AM Alic Hilburn, MD   2.5 mg at 02/16/24 1145   OLANZapine  (ZYPREXA ) tablet 7.5 mg  7.5 mg Oral QHS Braylea Brancato, MD   7.5 mg at 02/15/24 2103   traZODone  (DESYREL ) tablet 50 mg  50 mg Oral QHS PRN Motley-Mangrum, Jadeka A, PMHNP   50 mg at 02/15/24 2102    Lab Results:  No results found for this or any previous visit (from the past 48 hours).   Blood Alcohol  level:  Lab Results  Component Value Date   [facility] <15 01/31/2024   ETH <10 01/29/2024    Metabolic Disorder Labs: Lab Results  Component Value Date   HGBA1C 6.0 (H) 02/07/2024   MPG 125.5 02/07/2024   No results found for: "PROLACTIN" Lab Results  Component Value Date   CHOL 121 02/07/2024   TRIG 64 02/07/2024   HDL 39 (L) 02/07/2024   CHOLHDL 3.1 02/07/2024   VLDL 13 02/07/2024   LDLCALC 69  02/07/2024    Physical Findings: AIMS:  , ,  ,  ,    CIWA:    COWS:      Psychiatric Specialty Exam:  Presentation  General Appearance:  Casual  Eye Contact: Minimal  Speech: Normal Rate  Speech Volume: Decreased    Mood and Affect  Mood: Dysphoric  Affect: Flat  Thought Process  Thought Processes: Linear  Descriptions of Associations:Intact  Orientation:Partial  Thought Content:Illogical  Hallucinations: denies  Ideas of Reference:None  Suicidal Thoughts: denies  Homicidal Thoughts: denies   Sensorium  Memory: Immediate Fair; Recent Poor; Remote Poor  Judgment: Impaired  Insight: Shallow   Executive Functions  Concentration: Poor  Attention Span: Poor  Recall: Poor  Fund of Knowledge: Poor  Language: Poor   Psychomotor Activity  Psychomotor Activity: reduced  Musculoskeletal: Strength & Muscle Tone: within normal limits Gait & Station: normal Assets  Assets: Desire for Improvement; Resilience    Physical Exam: Physical Exam Vitals and nursing note reviewed.    Review of Systems  Constitutional: Negative.   Respiratory: Negative.     Blood pressure (!) 144/76, pulse 87, temperature 98.3 F (36.8 C), resp. rate 18, height 5\' 10"  (1.778 m), weight 117.9 kg, SpO2 96%. Body mass index is 37.31 kg/m.  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  Patient is admitted for further safety evaluation and stabilization. Patient is displaying cognitive problems, unable to give any information on his medical hx, or psychiatric hx, needing assistance with his ADLs.     Treatment Plan Summary:   Safety and Monitoring:             -- Voluntary admission to inpatient psychiatric unit for  safety, stabilization and treatment             -- Daily contact with patient to assess and evaluate symptoms and progress in treatment             -- Patient's case to be discussed in multi-disciplinary team meeting             -- Observation Level: q15 minute checks             -- Vital signs:  q12 hours             -- Precautions: suicide, elopement, and assault   2. Psychiatric Diagnoses and Treatment:               Lexapro  10 mg daily  zyprexa   2.5 mg QAM and increase to 7.5mg  QHS Ativan  1 mg every 4 hour as needed needed catatonia-started in ED   -- The risks/benefits/side-effects/alternatives to this medication were discussed in detail with the patient and time was given for questions. The patient consents to medication trial.                -- Metabolic profile and EKG monitoring obtained while on an atypical antipsychotic (BMI: Lipid Panel: HbgA1c: QTc:)              -- Encouraged patient to participate in unit milieu and in scheduled group therapies                            3. Medical Issues Being Addressed:  No urgent medical needs identified   4. Discharge Planning: APS has been called to evaluate the need for guardianship as patient shows great difficulty in doing his ADLS, unable to recall and manage his medical problems and psychiatric problems.             -- Social work and case management to assist with discharge planning and identification of  hospital follow-up needs prior to discharge             -- Estimated LOS: 5-7 days             -- Discharge Concerns: Need to establish a safety plan; Medication compliance and effectiveness             -- Discharge Goals: Return home with outpatient referrals follow ups   Physician Treatment Plan for Primary Diagnosis: MDD (major depressive disorder) Long Term Goal(s): Improvement in symptoms so as ready for discharge   Short Term Goals: Ability to identify changes in lifestyle to reduce recurrence of condition will improve,  Ability to verbalize feelings will improve, Ability to disclose and discuss suicidal ideas, Ability to demonstrate self-control will improve, and Ability to identify and develop effective coping behaviors will improve   Physician Treatment Plan for Secondary Diagnosis: Principal Problem:   MDD (major depressive disorder)   Long Term Goal(s): Improvement in symptoms so as ready for discharge   Short Term Goals: Ability to identify changes in lifestyle to reduce recurrence of condition will improve, Ability to verbalize feelings will improve, Ability to disclose and discuss suicidal ideas, Ability to demonstrate self-control will improve, Ability to identify and develop effective coping behaviors will improve, Ability to maintain clinical measurements within normal limits will improve, and Compliance with prescribed medications will improve  Aurelia Blotter, MD 02/16/2024, 2:10 PM

## 2024-02-16 NOTE — Group Note (Signed)
 Physical/Occupational Therapy Group Note  Group Topic: Neurographic Art  Group Date: 02/16/2024 Start Time: 1300 End Time: 1400 Facilitators: Hilma Lucks, OT   Group Description: Group participated with Neurographic art activity, using watercolor paints to facilitate creative expression and meditation/relaxation for each individual.  Incorporated bimanual coordination, mental focus, emotional processing, task/command following and relaxation techniques as appropriate.  Patients engaged socially with therapist and other group participants throughout session. Allowed to ask questions as appropriate, and encouraged to identify ways they could use/share their creations with themselves and others.  Therapeutic Goal(s):  Demonstrate ability to independently manipulate utensils required to participate with and complete activity. Demonstrate ability to cognitively focus on task and follow commands necessary for completion. Demonstrate use of art as an outlet for emotional processing and expression. Identify and demonstrate importance of relaxation, neural calming and meditation for improved participation with life groups.  Individual Participation: Pt seated in day room and agreeable to joining the group when asked. Pt quiet but did speak and interact when prompted and required only initial cues to initial project.   Participation Level: Active   Participation Quality: Minimal Cues   Behavior: Alert, Appropriate, Attentive , Calm, Cooperative, and Reserved   Speech/Thought Process: Coherent and Relevant   Affect/Mood: Appropriate   Insight: Moderate   Judgement: Moderate   Modes of Intervention: Activity, Clarification, Discussion, Education, Exploration, Problem-solving, and Socialization  Patient Response to Interventions:  Attentive, Engaged, Interested , and Receptive   Plan: Continue to engage patient in PT/OT groups 1 - 2x/week.  Johnmichael Melhorn R., MPH, MS, OTR/L ascom  [phone] 02/16/24, 3:48 PM

## 2024-02-16 NOTE — Group Note (Signed)
 Date:  02/16/2024 Time:  9:02 PM  Group Topic/Focus:  Wrap-Up Group:   The focus of this group is to help patients review their daily goal of treatment and discuss progress on daily workbooks.    Participation Level:  Active  Participation Quality:  Appropriate  Affect:  Appropriate  Cognitive:  Appropriate  Insight: Appropriate  Engagement in Group:  Engaged  Modes of Intervention:  Discussion    Bradley Caffey 02/16/2024, 9:02 PM

## 2024-02-16 NOTE — Plan of Care (Signed)
  Problem: Education: Goal: Emotional status will improve Outcome: Progressing Goal: Mental status will improve Outcome: Progressing Goal: Verbalization of understanding the information provided will improve Outcome: Progressing   Problem: Activity: Goal: Sleeping patterns will improve Outcome: Progressing   Problem: Coping: Goal: Ability to demonstrate self-control will improve Outcome: Progressing   Problem: Health Behavior/Discharge Planning: Goal: Compliance with treatment plan for underlying cause of condition will improve Outcome: Progressing   Problem: Physical Regulation: Goal: Ability to maintain clinical measurements within normal limits will improve Outcome: Progressing   Problem: Safety: Goal: Periods of time without injury will increase Outcome: Progressing

## 2024-02-17 DIAGNOSIS — F331 Major depressive disorder, recurrent, moderate: Secondary | ICD-10-CM | POA: Diagnosis not present

## 2024-02-17 NOTE — Plan of Care (Signed)
   Problem: Activity: Goal: Interest or engagement in activities will improve Outcome: Progressing Goal: Sleeping patterns will improve Outcome: Progressing

## 2024-02-17 NOTE — Plan of Care (Signed)
  Problem: Education: Goal: Knowledge of [facility] Education information/materials will improve Outcome: Progressing Goal: Emotional status will improve Outcome: Progressing Goal: Mental status will improve Outcome: Progressing Goal: Verbalization of understanding the information provided will improve Outcome: Progressing   Problem: Activity: Goal: Interest or engagement in activities will improve Outcome: Progressing Goal: Sleeping patterns will improve Outcome: Progressing   Problem: Coping: Goal: Ability to verbalize frustrations and anger appropriately will improve Outcome: Progressing Goal: Ability to demonstrate self-control will improve Outcome: Progressing   Problem: Safety: Goal: Periods of time without injury will increase Outcome: Progressing

## 2024-02-17 NOTE — Progress Notes (Signed)
 [facility] MD Progress Note  02/17/2024 1:07 PM [name]  MRN:  [id number]  Caleb Leblanc is a 63 y.o. male admitted: Presented to the ED on 01/31/2024  5:11 PM for altered mental status. He carries the psychiatric diagnoses of depression and has a past medical history of catatonia, OSA, constipation. His current presentation of despondence, hopeless, suicidal thoughts, flat affect is most consistent with depression.   Subjective:  Chart reviewed, case discussed in multidisciplinary meeting, patient seen during rounds.  Patient offers no complaints.  He is noted to be sitting in the day area.  Per nursing patient is taking his medications, noted to be visible on the unit, participating in the groups.  He denies hallucinations and consistently denies SI/HI/plan.  He is willing to work with the social workers regarding some shelter options.  He offers no complaints  Sleep: Fair  Appetite:  Fair  Past Psychiatric History: see h&P Family History: History reviewed. No pertinent family history. Social History:  Social History   Substance and Sexual Activity  Alcohol  Use Not Currently     Social History   Substance and Sexual Activity  Drug Use Never    Social History   Socioeconomic History   Marital status: Widowed    Spouse name: Not on file   Number of children: Not on file   Years of education: Not on file   Highest education level: Not on file  Occupational History   Not on file  Tobacco Use   Smoking status: Former    Current packs/day: 0.00    Types: Cigarettes    Quit date: 09/09/2017    Years since quitting: 6.4   Smokeless tobacco: Never   Tobacco comments:    pt states he smoked 4 cigs/day before he quit  Vaping Use   Vaping status: Never Used  Substance and Sexual Activity   Alcohol  use: Not Currently   Drug use: Never   Sexual activity: Not on file  Other Topics Concern   Not on file  Social History Narrative   Not on file   Social Drivers of Health    Financial Resource Strain: Not on file  Food Insecurity: Food Insecurity Present (02/04/2024)   Hunger Vital Sign    Worried About Running Out of Food in the Last Year: Often true    Ran Out of Food in the Last Year: Often true  Transportation Needs: Unmet Transportation Needs (02/04/2024)   PRAPARE - Administrator, Civil Service (Medical): Yes    Lack of Transportation (Non-Medical): Yes  Physical Activity: Not on file  Stress: Not on file  Social Connections: Socially Isolated (01/18/2024)   Social Connection and Isolation Panel [NHANES]    Frequency of Communication with Friends and Family: Never    Frequency of Social Gatherings with Friends and Family: Never    Attends Religious Services: Never    Database administrator or Organizations: No    Attends Engineer, structural: Never    Marital Status: Never married   Past Medical History:  Past Medical History:  Diagnosis Date   Hypertension     Past Surgical History:  Procedure Laterality Date   ABDOMINAL SURGERY      Current Medications: Current Facility-Administered Medications  Medication Dose Route Frequency Provider Last Rate Last Admin   acetaminophen  (TYLENOL ) tablet 650 mg  650 mg Oral Q6H PRN Motley-Mangrum, Jadeka A, PMHNP       alum & mag hydroxide-simeth (MAALOX/MYLANTA) 200-200-20  MG/5ML suspension 30 mL  30 mL Oral Q4H PRN Motley-Mangrum, Jadeka A, PMHNP       amLODipine  (NORVASC ) tablet 10 mg  10 mg Oral Daily Motley-Mangrum, Jadeka A, PMHNP   10 mg at 02/17/24 1118   haloperidol  (HALDOL ) tablet 5 mg  5 mg Oral TID PRN Motley-Mangrum, Jadeka A, PMHNP       And   diphenhydrAMINE  (BENADRYL ) capsule 50 mg  50 mg Oral TID PRN Motley-Mangrum, Jadeka A, PMHNP       haloperidol  lactate (HALDOL ) injection 5 mg  5 mg Intramuscular TID PRN Motley-Mangrum, Jadeka A, PMHNP       And   diphenhydrAMINE  (BENADRYL ) injection 50 mg  50 mg Intramuscular TID PRN Motley-Mangrum, Jadeka A, PMHNP       And    LORazepam  (ATIVAN ) injection 2 mg  2 mg Intramuscular TID PRN Motley-Mangrum, Jadeka A, PMHNP       haloperidol  lactate (HALDOL ) injection 10 mg  10 mg Intramuscular TID PRN Motley-Mangrum, Jadeka A, PMHNP       And   diphenhydrAMINE  (BENADRYL ) injection 50 mg  50 mg Intramuscular TID PRN Motley-Mangrum, Jadeka A, PMHNP       And   LORazepam  (ATIVAN ) injection 2 mg  2 mg Intramuscular TID PRN Motley-Mangrum, Jadeka A, PMHNP       escitalopram  (LEXAPRO ) tablet 10 mg  10 mg Oral Daily Shriyans Kuenzi, MD   10 mg at 02/17/24 1118   hydrOXYzine  (ATARAX ) tablet 25 mg  25 mg Oral TID PRN Motley-Mangrum, Jadeka A, PMHNP       influenza vac split trivalent PF (FLULAVAL) injection 0.5 mL  0.5 mL Intramuscular Tomorrow-1000 Aurelia Blotter, MD       lactulose  (CHRONULAC ) 10 GM/15ML solution 10 g  10 g Oral BID Quinto Tippy, MD   10 g at 02/17/24 1118   LORazepam  (ATIVAN ) tablet 1 mg  1 mg Oral Q4H PRN Motley-Mangrum, Jadeka A, PMHNP       magnesium  hydroxide (MILK OF MAGNESIA) suspension 30 mL  30 mL Oral Daily PRN Motley-Mangrum, Jadeka A, PMHNP       OLANZapine  (ZYPREXA ) tablet 2.5 mg  2.5 mg Oral q AM Fabion Gatson, MD   2.5 mg at 02/17/24 1610   OLANZapine  (ZYPREXA ) tablet 7.5 mg  7.5 mg Oral QHS Eryanna Regal, MD   7.5 mg at 02/16/24 2123   traZODone  (DESYREL ) tablet 50 mg  50 mg Oral QHS PRN Motley-Mangrum, Jadeka A, PMHNP   50 mg at 02/16/24 2124    Lab Results:  No results found for this or any previous visit (from the past 48 hours).   Blood Alcohol  level:  Lab Results  Component Value Date   [facility] <15 01/31/2024   ETH <10 01/29/2024    Metabolic Disorder Labs: Lab Results  Component Value Date   HGBA1C 6.0 (H) 02/07/2024   MPG 125.5 02/07/2024   No results found for: "PROLACTIN" Lab Results  Component Value Date   CHOL 121 02/07/2024   TRIG 64 02/07/2024   HDL 39 (L) 02/07/2024   CHOLHDL 3.1 02/07/2024   VLDL 13 02/07/2024   LDLCALC 69 02/07/2024    Physical  Findings: AIMS:  , ,  ,  ,    CIWA:    COWS:      Psychiatric Specialty Exam:  Presentation  General Appearance:  Casual  Eye Contact: Minimal  Speech: Normal Rate  Speech Volume: Decreased    Mood and Affect  Mood: Dysphoric  Affect:  Flat   Thought Process  Thought Processes: Linear  Descriptions of Associations:Intact  Orientation:Partial  Thought Content:Illogical  Hallucinations: denies  Ideas of Reference:None  Suicidal Thoughts: denies  Homicidal Thoughts: denies   Sensorium  Memory: Immediate Fair; Recent Poor; Remote Poor  Judgment: Impaired  Insight: Shallow   Executive Functions  Concentration: Poor  Attention Span: Poor  Recall: Poor  Fund of Knowledge: Poor  Language: Poor   Psychomotor Activity  Psychomotor Activity: reduced  Musculoskeletal: Strength & Muscle Tone: within normal limits Gait & Station: normal Assets  Assets: Desire for Improvement; Resilience    Physical Exam: Physical Exam Vitals and nursing note reviewed.    Review of Systems  Constitutional: Negative.   Respiratory: Negative.     Blood pressure (!) 154/89, pulse 78, temperature (!) 97.1 F (36.2 C), resp. rate 20, height 5\' 10"  (1.778 m), weight 117.9 kg, SpO2 94%. Body mass index is 37.31 kg/m.  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  Patient is admitted for further safety evaluation and stabilization. Patient is displaying cognitive problems, unable to give any information on his medical hx, or psychiatric hx, needing assistance with his ADLs.     Treatment Plan Summary:   Safety and Monitoring:             -- Voluntary admission to inpatient psychiatric unit for safety, stabilization  and treatment             -- Daily contact with patient to assess and evaluate symptoms and progress in treatment             -- Patient's case to be discussed in multi-disciplinary team meeting             -- Observation Level: q15 minute checks             -- Vital signs:  q12 hours             -- Precautions: suicide, elopement, and assault   2. Psychiatric Diagnoses and Treatment:               Lexapro  10 mg daily  zyprexa   2.5 mg QAM and increase to 7.5mg  QHS Ativan  1 mg every 4 hour as needed needed catatonia-started in ED   -- The risks/benefits/side-effects/alternatives to this medication were discussed in detail with the patient and time was given for questions. The patient consents to medication trial.                -- Metabolic profile and EKG monitoring obtained while on an atypical antipsychotic (BMI: Lipid Panel: HbgA1c: QTc:)              -- Encouraged patient to participate in unit milieu and in scheduled group therapies                            3. Medical Issues Being Addressed:  No urgent medical needs identified   4. Discharge Planning: APS has been called to evaluate the need for guardianship as patient shows great difficulty in doing his ADLS, unable to recall and manage his medical problems and psychiatric problems.             -- Social work and case management to assist with discharge  planning and identification of hospital follow-up needs prior to discharge             -- Estimated LOS: 5-7 days             -- Discharge Concerns: Need to establish a safety plan; Medication compliance and effectiveness             -- Discharge Goals: Return home with outpatient referrals follow ups   Physician Treatment Plan for Primary Diagnosis: MDD (major depressive disorder) Long Term Goal(s): Improvement in symptoms so as ready for discharge   Short Term Goals: Ability to identify changes in lifestyle to reduce recurrence of condition will improve, Ability to verbalize  feelings will improve, Ability to disclose and discuss suicidal ideas, Ability to demonstrate self-control will improve, and Ability to identify and develop effective coping behaviors will improve   Physician Treatment Plan for Secondary Diagnosis: Principal Problem:   MDD (major depressive disorder)   Long Term Goal(s): Improvement in symptoms so as ready for discharge   Short Term Goals: Ability to identify changes in lifestyle to reduce recurrence of condition will improve, Ability to verbalize feelings will improve, Ability to disclose and discuss suicidal ideas, Ability to demonstrate self-control will improve, Ability to identify and develop effective coping behaviors will improve, Ability to maintain clinical measurements within normal limits will improve, and Compliance with prescribed medications will improve  Aurelia Blotter, MD 02/17/2024, 1:07 PM

## 2024-02-17 NOTE — Progress Notes (Signed)
 Patient admitted to [facility] on February 04, 2024 for altered mental status, complaints of depression, and desiring medication stability.  Patient presents as depressed and flat. Support and encouragement given. Patient was visible in the milieu for the majority of the shift. He takes medications whole without difficulty. No concerns voiced. No PRN's given.  Q15 minute unit checks in place.

## 2024-02-17 NOTE — Progress Notes (Signed)
   02/16/24 2200  Psych Admission Type (Psych Patients Only)  Admission Status Voluntary  Psychosocial Assessment  Patient Complaints Depression  Eye Contact Brief  Facial Expression Flat  Affect Flat  Speech Soft  Interaction Minimal  Motor Activity Slow  Appearance/Hygiene In scrubs  Behavior Characteristics Cooperative  Mood Sad  Thought Process  Coherency WDL  Content WDL  Delusions None reported or observed  Perception Hallucinations  Hallucination None reported or observed  Judgment Impaired  Confusion Mild  Danger to Self  Current suicidal ideation? Denies

## 2024-02-18 DIAGNOSIS — F331 Major depressive disorder, recurrent, moderate: Secondary | ICD-10-CM | POA: Diagnosis not present

## 2024-02-18 MED ORDER — LACTULOSE 10 GM/15ML PO SOLN: 10.0000 g | Freq: Two times a day (BID) | ORAL | Status: DC | PRN

## 2024-02-18 NOTE — Group Note (Signed)
 Date:  02/18/2024 Time:  12:49 PM  Group Topic/Focus:  Personal Choices and Values:   The focus of this group is to help patients assess and explore the importance of values in their lives, how their values affect their decisions, how they express their values and what opposes their expression.    Participation Level:  Active  Participation Quality:  Appropriate  Affect:  Appropriate  Cognitive:  Appropriate  Insight: Appropriate  Engagement in Group:  Engaged  Modes of Intervention:  Activity  Additional Comments:    Ashlee Player 02/18/2024, 12:49 PM

## 2024-02-18 NOTE — Progress Notes (Signed)
 [facility] MD Progress Note  02/18/2024 7:14 PM Caleb Leblanc  MRN:  [id number]  Caleb Leblanc is a 63 y.o. male admitted: Presented to the ED on 01/31/2024  5:11 PM for altered mental status. He carries the psychiatric diagnoses of depression and has a past medical history of catatonia, OSA, constipation. His current presentation of despondence, hopeless, suicidal thoughts, flat affect is most consistent with depression.   Subjective:  Chart reviewed, case discussed in multidisciplinary meeting, patient seen during rounds.  Patient is noted to be sitting in the day area.  He has poor eye contact.  He reports doing well.  Denies feeling depressed or anxious.  He denies auditory/visual hallucinations.  He took his medications and offered no complaints.  He denies any symptoms of EPS including stiffness. Sleep: Fair  Appetite:  Fair  Past Psychiatric History: see h&P Family History: History reviewed. No pertinent family history. Social History:  Social History   Substance and Sexual Activity  Alcohol  Use Not Currently     Social History   Substance and Sexual Activity  Drug Use Never    Social History   Socioeconomic History   Marital status: Widowed    Spouse name: Not on file   Number of children: Not on file   Years of education: Not on file   Highest education level: Not on file  Occupational History   Not on file  Tobacco Use   Smoking status: Former    Current packs/day: 0.00    Types: Cigarettes    Quit date: 09/09/2017    Years since quitting: 6.4   Smokeless tobacco: Never   Tobacco comments:    pt states he smoked 4 cigs/day before he quit  Vaping Use   Vaping status: Never Used  Substance and Sexual Activity   Alcohol  use: Not Currently   Drug use: Never   Sexual activity: Not on file  Other Topics Concern   Not on file  Social History Narrative   Not on file   Social Drivers of Health   Financial Resource Strain: Not on file  Food Insecurity: Food  Insecurity Present (02/04/2024)   Hunger Vital Sign    Worried About Running Out of Food in the Last Year: Often true    Ran Out of Food in the Last Year: Often true  Transportation Needs: Unmet Transportation Needs (02/04/2024)   PRAPARE - Administrator, Civil Service (Medical): Yes    Lack of Transportation (Non-Medical): Yes  Physical Activity: Not on file  Stress: Not on file  Social Connections: Socially Isolated (01/18/2024)   Social Connection and Isolation Panel [NHANES]    Frequency of Communication with Friends and Family: Never    Frequency of Social Gatherings with Friends and Family: Never    Attends Religious Services: Never    Database administrator or Organizations: No    Attends Engineer, structural: Never    Marital Status: Never married   Past Medical History:  Past Medical History:  Diagnosis Date   Hypertension     Past Surgical History:  Procedure Laterality Date   ABDOMINAL SURGERY      Current Medications: Current Facility-Administered Medications  Medication Dose Route Frequency Provider Last Rate Last Admin   acetaminophen  (TYLENOL ) tablet 650 mg  650 mg Oral Q6H PRN Motley-Mangrum, Jadeka A, PMHNP       alum & mag hydroxide-simeth (MAALOX/MYLANTA) 200-200-20 MG/5ML suspension 30 mL  30 mL Oral Q4H PRN Motley-Mangrum, Jadeka  A, PMHNP       amLODipine  (NORVASC ) tablet 10 mg  10 mg Oral Daily Motley-Mangrum, Jadeka A, PMHNP   10 mg at 02/18/24 1610   haloperidol  (HALDOL ) tablet 5 mg  5 mg Oral TID PRN Motley-Mangrum, Jadeka A, PMHNP       And   diphenhydrAMINE  (BENADRYL ) capsule 50 mg  50 mg Oral TID PRN Motley-Mangrum, Jadeka A, PMHNP       haloperidol  lactate (HALDOL ) injection 5 mg  5 mg Intramuscular TID PRN Motley-Mangrum, Jadeka A, PMHNP       And   diphenhydrAMINE  (BENADRYL ) injection 50 mg  50 mg Intramuscular TID PRN Motley-Mangrum, Jadeka A, PMHNP       And   LORazepam  (ATIVAN ) injection 2 mg  2 mg Intramuscular TID PRN  Motley-Mangrum, Jadeka A, PMHNP       haloperidol  lactate (HALDOL ) injection 10 mg  10 mg Intramuscular TID PRN Motley-Mangrum, Jadeka A, PMHNP       And   diphenhydrAMINE  (BENADRYL ) injection 50 mg  50 mg Intramuscular TID PRN Motley-Mangrum, Jadeka A, PMHNP       And   LORazepam  (ATIVAN ) injection 2 mg  2 mg Intramuscular TID PRN Motley-Mangrum, Jadeka A, PMHNP       escitalopram  (LEXAPRO ) tablet 10 mg  10 mg Oral Daily Haelie Clapp, MD   10 mg at 02/18/24 9604   hydrOXYzine  (ATARAX ) tablet 25 mg  25 mg Oral TID PRN Motley-Mangrum, Jadeka A, PMHNP       influenza vac split trivalent PF (FLULAVAL) injection 0.5 mL  0.5 mL Intramuscular Tomorrow-1000 Aurelia Blotter, MD       lactulose  (CHRONULAC ) 10 GM/15ML solution 10 g  10 g Oral BID PRN Raychelle Hudman, MD       LORazepam  (ATIVAN ) tablet 1 mg  1 mg Oral Q4H PRN Motley-Mangrum, Jadeka A, PMHNP       magnesium  hydroxide (MILK OF MAGNESIA) suspension 30 mL  30 mL Oral Daily PRN Motley-Mangrum, Jadeka A, PMHNP       OLANZapine  (ZYPREXA ) tablet 2.5 mg  2.5 mg Oral q AM Taye Cato, MD   2.5 mg at 02/18/24 5409   OLANZapine  (ZYPREXA ) tablet 7.5 mg  7.5 mg Oral QHS Larson Limones, MD   7.5 mg at 02/17/24 2135   traZODone  (DESYREL ) tablet 50 mg  50 mg Oral QHS PRN Motley-Mangrum, Jadeka A, PMHNP   50 mg at 02/17/24 2136    Lab Results:  No results found for this or any previous visit (from the past 48 hours).   Blood Alcohol  level:  Lab Results  Component Value Date   [facility] <15 01/31/2024   ETH <10 01/29/2024    Metabolic Disorder Labs: Lab Results  Component Value Date   HGBA1C 6.0 (H) 02/07/2024   MPG 125.5 02/07/2024   No results found for: "PROLACTIN" Lab Results  Component Value Date   CHOL 121 02/07/2024   TRIG 64 02/07/2024   HDL 39 (L) 02/07/2024   CHOLHDL 3.1 02/07/2024   VLDL 13 02/07/2024   LDLCALC 69 02/07/2024    Physical Findings: AIMS:  , ,  ,  ,    CIWA:    COWS:      Psychiatric Specialty  Exam:  Presentation  General Appearance:  Casual  Eye Contact: Minimal  Speech: Normal Rate  Speech Volume: Decreased    Mood and Affect  Mood: Dysphoric  Affect: Flat   Thought Process  Thought Processes: Linear  Descriptions of Associations:Intact  Orientation:Partial  Thought Content:Illogical  Hallucinations: denies  Ideas of Reference:None  Suicidal Thoughts: denies  Homicidal Thoughts: denies   Sensorium  Memory: Immediate Fair; Recent Poor; Remote Poor  Judgment: Impaired  Insight: Shallow   Executive Functions  Concentration: Poor  Attention Span: Poor  Recall: Poor  Fund of Knowledge: Poor  Language: Poor   Psychomotor Activity  Psychomotor Activity: reduced  Musculoskeletal: Strength & Muscle Tone: within normal limits Gait & Station: normal Assets  Assets: Desire for Improvement; Resilience    Physical Exam: Physical Exam Vitals and nursing note reviewed.    Review of Systems  Constitutional: Negative.   Respiratory: Negative.     Blood pressure 122/79, pulse 96, temperature 98.4 F (36.9 C), resp. rate 16, height 5\' 10"  (1.778 m), weight 117.9 kg, SpO2 96%. Body mass index is 37.31 kg/m.  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  Patient is admitted for further safety evaluation and stabilization. Patient is displaying cognitive problems, unable to give any information on his medical hx, or psychiatric hx, needing assistance with his ADLs.     Treatment Plan Summary:   Safety and Monitoring:             -- Voluntary admission to inpatient psychiatric unit for safety, stabilization and treatment             -- Daily contact with patient to assess and evaluate  symptoms and progress in treatment             -- Patient's case to be discussed in multi-disciplinary team meeting             -- Observation Level: q15 minute checks             -- Vital signs:  q12 hours             -- Precautions: suicide, elopement, and assault   2. Psychiatric Diagnoses and Treatment:               Lexapro  10 mg daily  zyprexa   2.5 mg QAM and increase to 7.5mg  QHS Ativan  1 mg every 4 hour as needed needed catatonia-started in ED   -- The risks/benefits/side-effects/alternatives to this medication were discussed in detail with the patient and time was given for questions. The patient consents to medication trial.                -- Metabolic profile and EKG monitoring obtained while on an atypical antipsychotic (BMI: Lipid Panel: HbgA1c: QTc:)              -- Encouraged patient to participate in unit milieu and in scheduled group therapies                            3. Medical Issues Being Addressed:  No urgent medical needs identified   4. Discharge Planning: APS has been called to evaluate the need for guardianship as patient shows great difficulty in doing his ADLS, unable to recall and manage his medical problems and psychiatric problems.             -- Social work and case management to assist with discharge planning and identification of hospital follow-up needs prior to discharge             --  Estimated LOS: 3-4days             -- Discharge Concerns: Need to establish a safety plan; Medication compliance and effectiveness             -- Discharge Goals: Return home with outpatient referrals follow ups   Physician Treatment Plan for Primary Diagnosis: MDD (major depressive disorder) Long Term Goal(s): Improvement in symptoms so as ready for discharge   Short Term Goals: Ability to identify changes in lifestyle to reduce recurrence of condition will improve, Ability to verbalize feelings will improve, Ability to disclose and discuss suicidal ideas, Ability to  demonstrate self-control will improve, and Ability to identify and develop effective coping behaviors will improve   Physician Treatment Plan for Secondary Diagnosis: Principal Problem:   MDD (major depressive disorder)   Long Term Goal(s): Improvement in symptoms so as ready for discharge   Short Term Goals: Ability to identify changes in lifestyle to reduce recurrence of condition will improve, Ability to verbalize feelings will improve, Ability to disclose and discuss suicidal ideas, Ability to demonstrate self-control will improve, Ability to identify and develop effective coping behaviors will improve, Ability to maintain clinical measurements within normal limits will improve, and Compliance with prescribed medications will improve  Aurelia Blotter, MD 02/18/2024, 7:14 PM

## 2024-02-18 NOTE — Progress Notes (Signed)
   02/17/24 2100  Psych Admission Type (Psych Patients Only)  Admission Status Voluntary  Psychosocial Assessment  Patient Complaints Depression  Eye Contact Brief  Facial Expression Flat  Affect Flat  Speech Soft  Interaction Minimal  Motor Activity Slow  Appearance/Hygiene In scrubs  Behavior Characteristics Cooperative  Mood Sad  Thought Process  Coherency WDL  Content WDL  Delusions None reported or observed  Perception Hallucinations  Hallucination None reported or observed  Judgment Impaired  Confusion Mild  Danger to Self  Current suicidal ideation? Denies

## 2024-02-18 NOTE — Group Note (Signed)
 Date:  02/18/2024 Time:  8:20 PM  Group Topic/Focus:  Orientation:   The focus of this group is to educate the patient on the purpose and policies of crisis stabilization and provide a format to answer questions about their admission.  The group details unit policies and expectations of patients while admitted.    Participation Level:  Minimal  Participation Quality:  Attentive  Affect:  Flat  Cognitive:  Alert  Insight: Lacking  Engagement in Group:  Limited  Modes of Intervention:  Orientation  Additional Comments:    Sherlie Distance 02/18/2024, 8:20 PM

## 2024-02-18 NOTE — Progress Notes (Signed)
 Patient is a voluntary admission to [facility] for [facility]. Patient likes to sleep until noon then goes into the dayroom and stays.  He will ocassionally participate in group.  Denies SI, HI, AVH, anxiety and depression today when interviewed.  Denies constipation.  Does minimal interaction with peers and staff, but when he does he is friendly and cooperative.  Maintains his own ADL's.  Will continue to monitor.

## 2024-02-18 NOTE — Plan of Care (Signed)
   Problem: Education: Goal: Knowledge of [facility] Education information/materials will improve Outcome: Progressing Goal: Emotional status will improve Outcome: Progressing Goal: Mental status will improve Outcome: Progressing Goal: Verbalization of understanding the information provided will improve Outcome: Progressing   Problem: Activity: Goal: Interest or engagement in activities will improve Outcome: Progressing   Problem: Coping: Goal: Ability to verbalize frustrations and anger appropriately will improve Outcome: Progressing

## 2024-02-19 DIAGNOSIS — F331 Major depressive disorder, recurrent, moderate: Secondary | ICD-10-CM | POA: Diagnosis not present

## 2024-02-19 MED ORDER — OLANZAPINE 5 MG PO TABS
7.5000 mg | ORAL_TABLET | Freq: Every evening | ORAL | Status: DC
  Administered 2024-02-19 – 2024-02-22 (×4): 7.5 mg via ORAL
  Filled 2024-02-19 (×4): qty 2

## 2024-02-19 NOTE — Progress Notes (Signed)
 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 Patient has been in the dayroom most of the day, eating breakfast, lunch and dinner today (he usually skips breakfast).  Patient ambulates well and does his own ADL's.  Denies SI, HI, aVH, anxiety and depression.  Will continue to monitor.

## 2024-02-19 NOTE — Progress Notes (Signed)
   02/18/24 2000  Psych Admission Type (Psych Patients Only)  Admission Status Voluntary  Psychosocial Assessment  Patient Complaints Depression  Eye Contact Brief  Facial Expression Flat  Affect Flat  Speech Soft  Interaction Minimal  Motor Activity Slow  Appearance/Hygiene In scrubs  Behavior Characteristics Cooperative  Mood Sad  Thought Process  Coherency WDL  Delusions None reported or observed  Perception WDL  Hallucination None reported or observed  Judgment Impaired  Confusion Mild  Danger to Self  Current suicidal ideation? Denies (Denies)  Agreement Not to Harm Self Yes  Description of Agreement Verbal  Danger to Others  Danger to Others None reported or observed

## 2024-02-19 NOTE — Group Note (Signed)
 Date:  02/19/2024 Time:  8:53 PM  Group Topic/Focus:  Wrap-Up Group:   The focus of this group is to help patients review their daily goal of treatment and discuss progress on daily workbooks.    Participation Level:  Active  Participation Quality:  Appropriate and Attentive  Affect:  Appropriate  Cognitive:  Appropriate  Insight: Appropriate  Engagement in Group:  Engaged and Supportive  Modes of Intervention:  Discussion  Additional Comments:     Fabiola Holy 02/19/2024, 8:53 PM

## 2024-02-19 NOTE — Group Note (Signed)
 Recreation Therapy Group Note   Group Topic:Leisure Education  Group Date: 02/19/2024 Start Time: 1530 End Time: 1630 Facilitators: Deatrice Factor, LRT, CTRS Location: Dayroom  Group Description: Bingo. LRT and patients played multiple games of Bingo with music playing in the background. LRT and pts discussed how this could be a leisure interest and the importance of doing things they enjoy post-discharge. Pts won stress balls as Chief Financial Officer.    Goal Area(s) Addressed: Patient will identify leisure interests.  Patient will practice healthy decision making. Patient will engage in recreation activity.  Patient will increase communication.   Affect/Mood: Appropriate and Flat   Participation Level: Active and Engaged   Participation Quality: Independent   Behavior: Appropriate, Calm, and Cooperative   Speech/Thought Process: Coherent   Insight: Fair   Judgement: Good   Modes of Intervention: Competitive Play, Exploration, and Music   Patient Response to Interventions:  Attentive, Engaged, and Receptive   Education Outcome:  Acknowledges education   Clinical Observations/Individualized Feedback: Tricia was active in their participation of session activities and group discussion. Pt won a Facilities manager and received a stress ball as a prize. Pt interacted well with LRT and peers duration of session.    Plan: Continue to engage patient in RT group sessions 2-3x/week.   Deatrice Factor, LRT, CTRS 02/19/2024 5:24 PM

## 2024-02-19 NOTE — Progress Notes (Signed)
 [facility] MD Progress Note  02/19/2024 1:57 PM CANNEN OSHEL  MRN:  [id number]  [facility] is a 63 y.o. male admitted: Presented to the ED on 01/31/2024  5:11 PM for altered mental status. He carries the psychiatric diagnoses of depression and has a past medical history of catatonia, OSA, constipation. His current presentation of despondence, hopeless, suicidal thoughts, flat affect is most consistent with depression.   Subjective:  Chart reviewed, case discussed in multidisciplinary meeting, patient seen during rounds.  Patient is noted to be sitting in the day area.  He completed his breakfast.  He offers no complaints and reports doing well with the medication.  Discussed with the treatment team and social worker is waiting for the financial advisors to assist the patient in initiating application for Medicaid.  Patient denies SI/HI/plan.  Patient denies hallucinations.  Patient remains flat and a motivated.  Patient is not responding to internal stimuli.  Social work team is working on a safe disposition plan for the patient as he is homeless and has no financial support    Sleep: Fair  Appetite:  Fair  Past Psychiatric History: see h&P Family History: History reviewed. No pertinent family history. Social History:  Social History   Substance and Sexual Activity  Alcohol  Use Not Currently     Social History   Substance and Sexual Activity  Drug Use Never    Social History   Socioeconomic History   Marital status: Widowed    Spouse name: Not on file   Number of children: Not on file   Years of education: Not on file   Highest education level: Not on file  Occupational History   Not on file  Tobacco Use   Smoking status: Former    Current packs/day: 0.00    Types: Cigarettes    Quit date: 09/09/2017    Years since quitting: 6.4   Smokeless tobacco: Never   Tobacco comments:    pt states he smoked 4 cigs/day before he quit  Vaping Use   Vaping status: Never Used   Substance and Sexual Activity   Alcohol  use: Not Currently   Drug use: Never   Sexual activity: Not on file  Other Topics Concern   Not on file  Social History Narrative   Not on file   Social Drivers of Health   Financial Resource Strain: Not on file  Food Insecurity: Food Insecurity Present (02/04/2024)   Hunger Vital Sign    Worried About Running Out of Food in the Last Year: Often true    Ran Out of Food in the Last Year: Often true  Transportation Needs: Unmet Transportation Needs (02/04/2024)   PRAPARE - Administrator, Civil Service (Medical): Yes    Lack of Transportation (Non-Medical): Yes  Physical Activity: Not on file  Stress: Not on file  Social Connections: Socially Isolated (01/18/2024)   Social Connection and Isolation Panel [NHANES]    Frequency of Communication with Friends and Family: Never    Frequency of Social Gatherings with Friends and Family: Never    Attends Religious Services: Never    Database administrator or Organizations: No    Attends Engineer, structural: Never    Marital Status: Never married   Past Medical History:  Past Medical History:  Diagnosis Date   Hypertension     Past Surgical History:  Procedure Laterality Date   ABDOMINAL SURGERY      Current Medications: Current Facility-Administered Medications  Medication Dose Route Frequency Provider Last Rate Last Admin   acetaminophen  (TYLENOL ) tablet 650 mg  650 mg Oral Q6H PRN Motley-Mangrum, Jadeka A, PMHNP       alum & mag hydroxide-simeth (MAALOX/MYLANTA) 200-200-20 MG/5ML suspension 30 mL  30 mL Oral Q4H PRN Motley-Mangrum, Jadeka A, PMHNP       amLODipine  (NORVASC ) tablet 10 mg  10 mg Oral Daily Motley-Mangrum, Jadeka A, PMHNP   10 mg at 02/19/24 0817   haloperidol  (HALDOL ) tablet 5 mg  5 mg Oral TID PRN Motley-Mangrum, Jadeka A, PMHNP       And   diphenhydrAMINE  (BENADRYL ) capsule 50 mg  50 mg Oral TID PRN Motley-Mangrum, Jadeka A, PMHNP       haloperidol   lactate (HALDOL ) injection 5 mg  5 mg Intramuscular TID PRN Motley-Mangrum, Jadeka A, PMHNP       And   diphenhydrAMINE  (BENADRYL ) injection 50 mg  50 mg Intramuscular TID PRN Motley-Mangrum, Jadeka A, PMHNP       And   LORazepam  (ATIVAN ) injection 2 mg  2 mg Intramuscular TID PRN Motley-Mangrum, Jadeka A, PMHNP       haloperidol  lactate (HALDOL ) injection 10 mg  10 mg Intramuscular TID PRN Motley-Mangrum, Jadeka A, PMHNP       And   diphenhydrAMINE  (BENADRYL ) injection 50 mg  50 mg Intramuscular TID PRN Motley-Mangrum, Jadeka A, PMHNP       And   LORazepam  (ATIVAN ) injection 2 mg  2 mg Intramuscular TID PRN Motley-Mangrum, Jadeka A, PMHNP       escitalopram  (LEXAPRO ) tablet 10 mg  10 mg Oral Daily Zac Torti, MD   10 mg at 02/19/24 0817   hydrOXYzine  (ATARAX ) tablet 25 mg  25 mg Oral TID PRN Motley-Mangrum, Jadeka A, PMHNP       influenza vac split trivalent PF (FLULAVAL) injection 0.5 mL  0.5 mL Intramuscular Tomorrow-1000 Aurelia Blotter, MD       lactulose  (CHRONULAC ) 10 GM/15ML solution 10 g  10 g Oral BID PRN Laketra Bowdish, MD       LORazepam  (ATIVAN ) tablet 1 mg  1 mg Oral Q4H PRN Motley-Mangrum, Jadeka A, PMHNP       magnesium  hydroxide (MILK OF MAGNESIA) suspension 30 mL  30 mL Oral Daily PRN Motley-Mangrum, Jadeka A, PMHNP       OLANZapine  (ZYPREXA ) tablet 2.5 mg  2.5 mg Oral q AM Josselyne Onofrio, MD   2.5 mg at 02/19/24 6045   OLANZapine  (ZYPREXA ) tablet 7.5 mg  7.5 mg Oral QHS Rosanna Bickle, MD   7.5 mg at 02/18/24 2136   traZODone  (DESYREL ) tablet 50 mg  50 mg Oral QHS PRN Motley-Mangrum, Jadeka A, PMHNP   50 mg at 02/18/24 2137    Lab Results:  No results found for this or any previous visit (from the past 48 hours).   Blood Alcohol  level:  Lab Results  Component Value Date   [facility] <15 01/31/2024   ETH <10 01/29/2024    Metabolic Disorder Labs: Lab Results  Component Value Date   HGBA1C 6.0 (H) 02/07/2024   MPG 125.5 02/07/2024   No results found for:  "PROLACTIN" Lab Results  Component Value Date   CHOL 121 02/07/2024   TRIG 64 02/07/2024   HDL 39 (L) 02/07/2024   CHOLHDL 3.1 02/07/2024   VLDL 13 02/07/2024   LDLCALC 69 02/07/2024    Physical Findings: AIMS:  , ,  ,  ,    CIWA:    COWS:  Psychiatric Specialty Exam:  Presentation  General Appearance:  Casual  Eye Contact: Minimal  Speech: Normal Rate  Speech Volume: Decreased    Mood and Affect  Mood: Dysphoric  Affect: Flat   Thought Process  Thought Processes: Linear  Descriptions of Associations:Intact  Orientation:Partial  Thought Content:Illogical  Hallucinations: denies  Ideas of Reference:None  Suicidal Thoughts: denies  Homicidal Thoughts: denies   Sensorium  Memory: Immediate Fair; Recent Poor; Remote Poor  Judgment: Impaired  Insight: Shallow   Executive Functions  Concentration: Poor  Attention Span: Poor  Recall: Poor  Fund of Knowledge: Poor  Language: Poor   Psychomotor Activity  Psychomotor Activity: reduced  Musculoskeletal: Strength & Muscle Tone: within normal limits Gait & Station: normal Assets  Assets: Desire for Improvement; Resilience    Physical Exam: Physical Exam Vitals and nursing note reviewed.    Review of Systems  Constitutional: Negative.   Respiratory: Negative.     Blood pressure 122/77, pulse 95, temperature 98.1 F (36.7 C), resp. rate 18, height 5\' 10"  (1.778 m), weight 117.9 kg, SpO2 97%. Body mass index is 37.31 kg/m.  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  Patient is admitted for further safety evaluation and stabilization. Patient is displaying cognitive problems, unable to give any information on his medical hx, or  psychiatric hx, needing assistance with his ADLs.     Treatment Plan Summary:   Safety and Monitoring:             -- Voluntary admission to inpatient psychiatric unit for safety, stabilization and treatment             -- Daily contact with patient to assess and evaluate symptoms and progress in treatment             -- Patient's case to be discussed in multi-disciplinary team meeting             -- Observation Level: q15 minute checks             -- Vital signs:  q12 hours             -- Precautions: suicide, elopement, and assault   2. Psychiatric Diagnoses and Treatment:               Lexapro  10 mg daily  zyprexa   2.5 mg QAM and increase to 7.5mg  QHS Ativan  1 mg every 4 hour as needed needed catatonia-started in ED   -- The risks/benefits/side-effects/alternatives to this medication were discussed in detail with the patient and time was given for questions. The patient consents to medication trial.                -- Metabolic profile and EKG monitoring obtained while on an atypical antipsychotic (BMI: Lipid Panel: HbgA1c: QTc:)              -- Encouraged patient to participate in unit milieu and in scheduled group therapies                            3. Medical Issues Being Addressed:  No urgent medical needs identified   4. Discharge Planning: APS has been called to evaluate the need for guardianship as patient shows great difficulty in doing his ADLS, unable to  recall and manage his medical problems and psychiatric problems.             -- Social work and case management to assist with discharge planning and identification of hospital follow-up needs prior to discharge             -- Estimated LOS: 3-4days             -- Discharge Concerns: Need to establish a safety plan; Medication compliance and effectiveness             -- Discharge Goals: Return home with outpatient referrals follow ups   Physician Treatment Plan for Primary Diagnosis: MDD (major depressive  disorder) Long Term Goal(s): Improvement in symptoms so as ready for discharge   Short Term Goals: Ability to identify changes in lifestyle to reduce recurrence of condition will improve, Ability to verbalize feelings will improve, Ability to disclose and discuss suicidal ideas, Ability to demonstrate self-control will improve, and Ability to identify and develop effective coping behaviors will improve   Physician Treatment Plan for Secondary Diagnosis: Principal Problem:   MDD (major depressive disorder)   Long Term Goal(s): Improvement in symptoms so as ready for discharge   Short Term Goals: Ability to identify changes in lifestyle to reduce recurrence of condition will improve, Ability to verbalize feelings will improve, Ability to disclose and discuss suicidal ideas, Ability to demonstrate self-control will improve, Ability to identify and develop effective coping behaviors will improve, Ability to maintain clinical measurements within normal limits will improve, and Compliance with prescribed medications will improve  Aurelia Blotter, MD 02/19/2024, 1:57 PM

## 2024-02-19 NOTE — Plan of Care (Signed)
 ?  Problem: Education: ?Goal: Mental status will improve ?Outcome: Progressing ?Goal: Verbalization of understanding the information provided will improve ?Outcome: Progressing ?  ?

## 2024-02-20 DIAGNOSIS — F331 Major depressive disorder, recurrent, moderate: Secondary | ICD-10-CM | POA: Diagnosis not present

## 2024-02-20 NOTE — Progress Notes (Signed)
   02/20/24 2000  Psych Admission Type (Psych Patients Only)  Admission Status Voluntary  Psychosocial Assessment  Patient Complaints None  Eye Contact Brief  Facial Expression Flat  Affect Flat  Speech Soft  Interaction Minimal  Motor Activity Slow  Appearance/Hygiene In scrubs  Behavior Characteristics Cooperative  Mood Depressed;Pleasant  Thought Process  Coherency WDL  Content WDL  Delusions None reported or observed  Perception WDL  Hallucination None reported or observed  Judgment Limited  Confusion Mild  Danger to Self  Current suicidal ideation? Denies  Danger to Others  Danger to Others None reported or observed   Progress note   D: Pt seen sitting in dayroom. Pt denies SI, HI, AVH. Pt rates pain  0/10. Pt rates anxiety  0/10 and depression  0/10. Pt states he is eating and sleeping better. Attending groups. No other concerns noted at this time.  A: Pt provided support and encouragement. Pt given scheduled medication as prescribed. PRNs as appropriate. Q15 min checks for safety.   R: Pt safe on the unit. Will continue to monitor.

## 2024-02-20 NOTE — Group Note (Signed)
 Date:  02/20/2024 Time:  6:10 PM  Group Topic/Focus:  Wellness Toolbox:   The focus of this group is to discuss various aspects of wellness, balancing those aspects and exploring ways to increase the ability to experience wellness.  Patients will create a wellness toolbox for use upon discharge.    Participation Level:  Active  Participation Quality:  Appropriate  Affect:  Appropriate  Cognitive:  Appropriate  Insight: Appropriate  Engagement in Group:  Engaged  Modes of Intervention:  Activity and Socialization  Additional Comments:    Laverne Potter 02/20/2024, 6:10 PM

## 2024-02-20 NOTE — Group Note (Signed)
 Date:  02/20/2024 Time:  11:18 PM  Group Topic/Focus:  Wrap-Up Group:   The focus of this group is to help patients review their daily goal of treatment and discuss progress on daily workbooks.    Participation Level:  Active  Participation Quality:  Appropriate  Affect:  Appropriate  Cognitive:  Appropriate  Insight: Appropriate  Engagement in Group:  Engaged  Modes of Intervention:  Discussion  Additional Comments:    [facility] 02/20/2024, 11:18 PM

## 2024-02-20 NOTE — Progress Notes (Signed)
   02/20/24 1200  Psych Admission Type (Psych Patients Only)  Admission Status Voluntary  Psychosocial Assessment  Patient Complaints None  Eye Contact Brief  Facial Expression Flat  Affect Flat  Speech Soft  Interaction Minimal  Motor Activity Slow  Appearance/Hygiene In scrubs  Behavior Characteristics Cooperative  Mood Depressed  Thought Process  Coherency WDL  Content WDL  Delusions None reported or observed  Perception WDL  Hallucination None reported or observed  Judgment Impaired  Confusion Mild  Danger to Self  Current suicidal ideation? Denies  Agreement Not to Harm Self Yes  Description of Agreement verbal  Danger to Others  Danger to Others None reported or observed

## 2024-02-20 NOTE — BH IP Treatment Plan (Signed)
 Interdisciplinary Treatment and Diagnostic Plan Update  02/20/2024 Time of Session: 10:00 AM  Caleb Leblanc MRN: [id number]  Principal Diagnosis: MDD (major depressive disorder)  Secondary Diagnoses: Principal Problem:   MDD (major depressive disorder)   Current Medications:  Current Facility-Administered Medications  Medication Dose Route Frequency Provider Last Rate Last Admin   acetaminophen  (TYLENOL ) tablet 650 mg  650 mg Oral Q6H PRN Motley-Mangrum, Jadeka A, PMHNP       alum & mag hydroxide-simeth (MAALOX/MYLANTA) 200-200-20 MG/5ML suspension 30 mL  30 mL Oral Q4H PRN Motley-Mangrum, Jadeka A, PMHNP       amLODipine  (NORVASC ) tablet 10 mg  10 mg Oral Daily Motley-Mangrum, Jadeka A, PMHNP   10 mg at 02/20/24 0900   haloperidol  (HALDOL ) tablet 5 mg  5 mg Oral TID PRN Motley-Mangrum, Jadeka A, PMHNP       And   diphenhydrAMINE  (BENADRYL ) capsule 50 mg  50 mg Oral TID PRN Motley-Mangrum, Jadeka A, PMHNP       haloperidol  lactate (HALDOL ) injection 5 mg  5 mg Intramuscular TID PRN Motley-Mangrum, Jadeka A, PMHNP       And   diphenhydrAMINE  (BENADRYL ) injection 50 mg  50 mg Intramuscular TID PRN Motley-Mangrum, Jadeka A, PMHNP       And   LORazepam  (ATIVAN ) injection 2 mg  2 mg Intramuscular TID PRN Motley-Mangrum, Jadeka A, PMHNP       haloperidol  lactate (HALDOL ) injection 10 mg  10 mg Intramuscular TID PRN Motley-Mangrum, Jadeka A, PMHNP       And   diphenhydrAMINE  (BENADRYL ) injection 50 mg  50 mg Intramuscular TID PRN Motley-Mangrum, Jadeka A, PMHNP       And   LORazepam  (ATIVAN ) injection 2 mg  2 mg Intramuscular TID PRN Motley-Mangrum, Jadeka A, PMHNP       escitalopram  (LEXAPRO ) tablet 10 mg  10 mg Oral Daily Jadapalle, Sree, MD   10 mg at 02/20/24 0900   hydrOXYzine  (ATARAX ) tablet 25 mg  25 mg Oral TID PRN Motley-Mangrum, Jadeka A, PMHNP       influenza vac split trivalent PF (FLULAVAL) injection 0.5 mL  0.5 mL Intramuscular Tomorrow-1000 Aurelia Blotter, MD        lactulose  (CHRONULAC ) 10 GM/15ML solution 10 g  10 g Oral BID PRN Jadapalle, Sree, MD       LORazepam  (ATIVAN ) tablet 1 mg  1 mg Oral Q4H PRN Motley-Mangrum, Jadeka A, PMHNP       magnesium  hydroxide (MILK OF MAGNESIA) suspension 30 mL  30 mL Oral Daily PRN Motley-Mangrum, Jadeka A, PMHNP       OLANZapine  (ZYPREXA ) tablet 2.5 mg  2.5 mg Oral q AM Jadapalle, Sree, MD   2.5 mg at 02/20/24 0900   OLANZapine  (ZYPREXA ) tablet 7.5 mg  7.5 mg Oral QPM Jadapalle, Sree, MD   7.5 mg at 02/19/24 1748   traZODone  (DESYREL ) tablet 50 mg  50 mg Oral QHS PRN Motley-Mangrum, Jadeka A, PMHNP   50 mg at 02/19/24 2153   PTA Medications: Medications Prior to Admission  Medication Sig Dispense Refill Last Dose/Taking   amLODipine  (NORVASC ) 10 MG tablet Take 10 mg by mouth daily. (Patient not taking: Reported on 02/02/2024)      OLANZapine  (ZYPREXA ) 5 MG tablet Take 1 tablet (5 mg total) by mouth at bedtime. (Patient not taking: Reported on 02/02/2024) 30 tablet 0     Patient Stressors: Other: "Life in general." Patient was unable/refused to go into further detail.     Patient Strengths:  Other: Patient declines  Treatment Modalities: Medication Management, Group therapy, Case management,  1 to 1 session with clinician, Psychoeducation, Recreational therapy.   Physician Treatment Plan for Primary Diagnosis: MDD (major depressive disorder) Long Term Goal(s): Improvement in symptoms so as ready for discharge   Short Term Goals: Ability to identify changes in lifestyle to reduce recurrence of condition will improve Ability to verbalize feelings will improve Ability to disclose and discuss suicidal ideas Ability to demonstrate self-control will improve Ability to identify and develop effective coping behaviors will improve Ability to maintain clinical measurements within normal limits will improve Compliance with prescribed medications will improve  Medication Management: Evaluate patient's response, side  effects, and tolerance of medication regimen.  Therapeutic Interventions: 1 to 1 sessions, Unit Group sessions and Medication administration.  Evaluation of Outcomes: Progressing  Physician Treatment Plan for Secondary Diagnosis: Principal Problem:   MDD (major depressive disorder)  Long Term Goal(s): Improvement in symptoms so as ready for discharge   Short Term Goals: Ability to identify changes in lifestyle to reduce recurrence of condition will improve Ability to verbalize feelings will improve Ability to disclose and discuss suicidal ideas Ability to demonstrate self-control will improve Ability to identify and develop effective coping behaviors will improve Ability to maintain clinical measurements within normal limits will improve Compliance with prescribed medications will improve     Medication Management: Evaluate patient's response, side effects, and tolerance of medication regimen.  Therapeutic Interventions: 1 to 1 sessions, Unit Group sessions and Medication administration.  Evaluation of Outcomes: Progressing   RN Treatment Plan for Primary Diagnosis: MDD (major depressive disorder) Long Term Goal(s): Knowledge of disease and therapeutic regimen to maintain health will improve  Short Term Goals: Ability to remain free from injury will improve, Ability to verbalize frustration and anger appropriately will improve, Ability to demonstrate self-control, Ability to participate in decision making will improve, Ability to verbalize feelings will improve, Ability to disclose and discuss suicidal ideas, Ability to identify and develop effective coping behaviors will improve, and Compliance with prescribed medications will improve  Medication Management: RN will administer medications as ordered by provider, will assess and evaluate patient's response and provide education to patient for prescribed medication. RN will report any adverse and/or side effects to prescribing  provider.  Therapeutic Interventions: 1 on 1 counseling sessions, Psychoeducation, Medication administration, Evaluate responses to treatment, Monitor vital signs and CBGs as ordered, Perform/monitor CIWA, COWS, AIMS and Fall Risk screenings as ordered, Perform wound care treatments as ordered.  Evaluation of Outcomes: Progressing   LCSW Treatment Plan for Primary Diagnosis: MDD (major depressive disorder) Long Term Goal(s): Safe transition to appropriate next level of care at discharge, Engage patient in therapeutic group addressing interpersonal concerns.  Short Term Goals: Engage patient in aftercare planning with referrals and resources, Increase social support, Increase ability to appropriately verbalize feelings, Increase emotional regulation, Facilitate acceptance of mental health diagnosis and concerns, Facilitate patient progression through stages of change regarding substance use diagnoses and concerns, Identify triggers associated with mental health/substance abuse issues, and Increase skills for wellness and recovery  Therapeutic Interventions: Assess for all discharge needs, 1 to 1 time with Social worker, Explore available resources and support systems, Assess for adequacy in community support network, Educate family and significant other(s) on suicide prevention, Complete Psychosocial Assessment, Interpersonal group therapy.  Evaluation of Outcomes: Progressing   Progress in Treatment: Attending groups: Yes. and No. Participating in groups: Yes. and No. Taking medication as prescribed: Yes. Toleration medication: Yes. Family/Significant other  contact made: No, will contact:  CSW will continue attempts Patient understands diagnosis: No. Discussing patient identified problems/goals with staff: Yes. Medical problems stabilized or resolved: Yes. Denies suicidal/homicidal ideation: Yes. Issues/concerns per patient self-inventory: Yes. Other:    New problem(s) identified: No,  Describe:  None identified. Update 02/10/2024: No changes at this time Update 02/15/2024: No changes at this time Update 02/20/2024: No changes at this time    New Short Term/Long Term Goal(s): elimination of symptoms of psychosis, medication management for mood stabilization; elimination of SI thoughts; development of comprehensive mental wellness plan. Update 02/10/2024: No changes at this time. Update 02/15/2024: No changes at this time Update 02/20/2024: No changes at this time      Patient Goals:  "I'm here to get to normal status again"  Update 02/10/2024: No changes at this time. Update 02/15/2024: No changes at this time Update 02/20/2024: No changes at this time      Discharge Plan or Barriers: CSW will assist with appropriate discharge planning.  Update 02/10/2024: Pt competency concerns. APS referral completed.  Update 02/15/2024: APS came to see pt,  CSW has provided required medical documents per their request Update 02/20/2024: CSW awaits update from DSS about pt's case. Caseworker reports it is supposed to be staffed this week      Reason for Continuation of Hospitalization: Medication stabilization   Estimated Length of Stay: 1 to 7 days Update 02/10/24: TBD Update 02/15/2024: TBD Update 02/20/2024: TBD    Last 3 [place] Suicide Severity Risk Score: Flowsheet Row Admission (Current) from 02/04/2024 in [facility] ED from 01/31/2024 in [facility] at [facility] ED from 01/29/2024 in [facility] at [facility]  C-SSRS RISK CATEGORY Low Risk No Risk No Risk       Last [facility] 2/9 Scores:     No data to display          Scribe for Treatment Team: Claudio Culver, LCSWA 02/20/2024 1:20 PM

## 2024-02-20 NOTE — Group Note (Signed)
 Recreation Therapy Group Note   Group Topic:General Recreation  Group Date: 02/20/2024 Start Time: 1500 End Time: 1540 Facilitators: Deatrice Factor, LRT, CTRS Location: Courtyard  Group Description: Outdoor Recreation. Patients had the option to play corn hole, ring toss, bowling or listening to music while outside in the courtyard getting fresh air and sunlight. Patients helped water and prune the raised garden beds. LRT and patients discussed things that they enjoy doing in their free time outside of the hospital. LRT encouraged patients to drink water after being active and getting their heart rate up.   Goal Area(s) Addressed: Patient will identify leisure interests.  Patient will practice healthy decision making. Patient will engage in recreation activity.   Affect/Mood: Flat   Participation Level: Engaged   Participation Quality: Independent   Behavior: Calm   Speech/Thought Process: Coherent   Insight: Fair   Judgement: Fair    Modes of Intervention: Activity   Patient Response to Interventions:  Receptive   Education Outcome:  Acknowledges education   Clinical Observations/Individualized Feedback: Caleb Leblanc was active in their participation of session activities and group discussion. Pt interacted well with LRT and peers duration of session.    Plan: Continue to engage patient in RT group sessions 2-3x/week.   [address] LRT, CTRS 02/20/2024 5:02 PM

## 2024-02-20 NOTE — Progress Notes (Signed)
   02/20/24 0300  Psych Admission Type (Psych Patients Only)  Admission Status Voluntary  Psychosocial Assessment  Patient Complaints Depression  Eye Contact Brief  Facial Expression Flat  Affect Flat  Speech Soft  Interaction Minimal  Motor Activity Slow  Appearance/Hygiene In scrubs  Behavior Characteristics Cooperative  Mood Depressed  Thought Process  Coherency WDL  Content WDL  Delusions None reported or observed  Perception WDL  Hallucination None reported or observed  Judgment Impaired  Confusion Mild  Danger to Self  Current suicidal ideation? Denies  Danger to Others  Danger to Others None reported or observed

## 2024-02-20 NOTE — Plan of Care (Signed)
   Problem: Education: Goal: Knowledge of [place] [facility] Education information/materials will improve Outcome: Progressing Goal: Verbalization of understanding the information provided will improve Outcome: Progressing

## 2024-02-20 NOTE — Group Note (Signed)
 Recreation Therapy Group Note   Group Topic:Health and Wellness  Group Date: 02/20/2024 Start Time: 1100 End Time: 1140 Facilitators: Deatrice Factor, LRT, CTRS Location: Dayroom  Group Description: Seated Exercise. LRT discussed the mental and physical benefits of exercise. LRT and group discussed how physical activity can be used as a coping skill. Pt's and LRT followed along to an exercise video on the TV screen that provided a visual representation and audio description of every exercise performed. Pt's encouraged to listen to their bodies and stop at any time if they experience feelings of discomfort or pain. Pts were encouraged to drink water and stay hydrated.   Goal Area(s) Addressed: Patient will learn benefits of physical activity. Patient will identify exercise as a coping skill.  Patient will follow multistep directions. Patient will try a new leisure interest.   Affect/Mood: Appropriate   Participation Level: Active and Engaged   Participation Quality: Independent   Behavior: Appropriate   Speech/Thought Process: Coherent   Insight: Good   Judgement: Good   Modes of Intervention: Activity, Education, and Exploration   Patient Response to Interventions:  Attentive, Engaged, Interested , and Receptive   Education Outcome:  Acknowledges education   Clinical Observations/Individualized Feedback: Caleb Leblanc was active in their participation of session activities and group discussion. Pt completed all exercises as prompted. Pt interacted well with LRT and peers duration of session.    Plan: Continue to engage patient in RT group sessions 2-3x/week.   Deatrice Factor, LRT, CTRS 02/20/2024 1:32 PM

## 2024-02-20 NOTE — Progress Notes (Signed)
 [facility] MD Progress Note  02/20/2024 12:28 PM Caleb Leblanc  MRN:  [id number]  Caleb Leblanc is a 63 y.o. male admitted: Presented to the ED on 01/31/2024  5:11 PM for altered mental status. He carries the psychiatric diagnoses of depression and has a past medical history of catatonia, OSA, constipation. His current presentation of despondence, hopeless, suicidal thoughts, flat affect is most consistent with depression.   Subjective:  Chart reviewed, case discussed in multidisciplinary meeting, patient seen during rounds.  Patient is noted to be sitting in the day area.  He reports fair appetite and sleep.  Per nursing report patient is not displaying any behavior problems, taking his medications.  Provider did discuss changing the timings of the medication where patient gets the evening medications by 6:00 to minimize the daytime sedation.  Patient is not endorsing SI/HI/plan.  Patient is not responding to internal stimuli and denies hallucinations.  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   Sleep: Fair  Appetite:  Fair  Past Psychiatric History: see h&P Family History: History reviewed. No pertinent family history. Social History:  Social History   Substance and Sexual Activity  Alcohol  Use Not Currently     Social History   Substance and Sexual Activity  Drug Use Never    Social History   Socioeconomic History   Marital status: Widowed    Spouse name: Not on file   Number of children: Not on file   Years of education: Not on file   Highest education level: Not on file  Occupational History   Not on file  Tobacco Use   Smoking status: Former    Current packs/day: 0.00    Types: Cigarettes    Quit date: 09/09/2017    Years since quitting: 6.4   Smokeless tobacco: Never   Tobacco comments:    pt states he smoked 4 cigs/day before he quit  Vaping Use   Vaping status: Never Used  Substance and Sexual Activity    Alcohol  use: Not Currently   Drug use: Never   Sexual activity: Not on file  Other Topics Concern   Not on file  Social History Narrative   Not on file   Social Drivers of Health   Financial Resource Strain: Not on file  Food Insecurity: Food Insecurity Present (02/04/2024)   Hunger Vital Sign    Worried About Running Out of Food in the Last Year: Often true    Ran Out of Food in the Last Year: Often true  Transportation Needs: Unmet Transportation Needs (02/04/2024)   PRAPARE - Administrator, Civil Service (Medical): Yes    Lack of Transportation (Non-Medical): Yes  Physical Activity: Not on file  Stress: Not on file  Social Connections: Socially Isolated (01/18/2024)   Social Connection and Isolation Panel [NHANES]    Frequency of Communication with Friends and Family: Never    Frequency of Social Gatherings with Friends and Family: Never    Attends Religious Services: Never    Database administrator or Organizations: No    Attends Engineer, structural: Never    Marital Status: Never married   Past Medical History:  Past Medical History:  Diagnosis Date   Hypertension     Past Surgical History:  Procedure Laterality Date   ABDOMINAL SURGERY      Current Medications: Current Facility-Administered Medications  Medication Dose Route Frequency Provider Last Rate Last Admin  acetaminophen  (TYLENOL ) tablet 650 mg  650 mg Oral Q6H PRN Motley-Mangrum, Jadeka A, PMHNP       alum & mag hydroxide-simeth (MAALOX/MYLANTA) 200-200-20 MG/5ML suspension 30 mL  30 mL Oral Q4H PRN Motley-Mangrum, Jadeka A, PMHNP       amLODipine  (NORVASC ) tablet 10 mg  10 mg Oral Daily Motley-Mangrum, Jadeka A, PMHNP   10 mg at 02/20/24 0900   haloperidol  (HALDOL ) tablet 5 mg  5 mg Oral TID PRN Motley-Mangrum, Jadeka A, PMHNP       And   diphenhydrAMINE  (BENADRYL ) capsule 50 mg  50 mg Oral TID PRN Motley-Mangrum, Jadeka A, PMHNP       haloperidol  lactate (HALDOL ) injection 5 mg  5  mg Intramuscular TID PRN Motley-Mangrum, Jadeka A, PMHNP       And   diphenhydrAMINE  (BENADRYL ) injection 50 mg  50 mg Intramuscular TID PRN Motley-Mangrum, Jadeka A, PMHNP       And   LORazepam  (ATIVAN ) injection 2 mg  2 mg Intramuscular TID PRN Motley-Mangrum, Jadeka A, PMHNP       haloperidol  lactate (HALDOL ) injection 10 mg  10 mg Intramuscular TID PRN Motley-Mangrum, Jadeka A, PMHNP       And   diphenhydrAMINE  (BENADRYL ) injection 50 mg  50 mg Intramuscular TID PRN Motley-Mangrum, Jadeka A, PMHNP       And   LORazepam  (ATIVAN ) injection 2 mg  2 mg Intramuscular TID PRN Motley-Mangrum, Jadeka A, PMHNP       escitalopram  (LEXAPRO ) tablet 10 mg  10 mg Oral Daily Devina Bezold, MD   10 mg at 02/20/24 0900   hydrOXYzine  (ATARAX ) tablet 25 mg  25 mg Oral TID PRN Motley-Mangrum, Jadeka A, PMHNP       influenza vac split trivalent PF (FLULAVAL) injection 0.5 mL  0.5 mL Intramuscular Tomorrow-1000 Aurelia Blotter, MD       lactulose  (CHRONULAC ) 10 GM/15ML solution 10 g  10 g Oral BID PRN Kianna Billet, MD       LORazepam  (ATIVAN ) tablet 1 mg  1 mg Oral Q4H PRN Motley-Mangrum, Jadeka A, PMHNP       magnesium  hydroxide (MILK OF MAGNESIA) suspension 30 mL  30 mL Oral Daily PRN Motley-Mangrum, Jadeka A, PMHNP       OLANZapine  (ZYPREXA ) tablet 2.5 mg  2.5 mg Oral q AM Maeven Mcdougall, MD   2.5 mg at 02/20/24 0900   OLANZapine  (ZYPREXA ) tablet 7.5 mg  7.5 mg Oral QPM Taavi Hoose, MD   7.5 mg at 02/19/24 1748   traZODone  (DESYREL ) tablet 50 mg  50 mg Oral QHS PRN Motley-Mangrum, Jadeka A, PMHNP   50 mg at 02/19/24 2153    Lab Results:  No results found for this or any previous visit (from the past 48 hours).   Blood Alcohol  level:  Lab Results  Component Value Date   [facility] <15 01/31/2024   ETH <10 01/29/2024    Metabolic Disorder Labs: Lab Results  Component Value Date   HGBA1C 6.0 (H) 02/07/2024   MPG 125.5 02/07/2024   No results found for: "PROLACTIN" Lab Results  Component  Value Date   CHOL 121 02/07/2024   TRIG 64 02/07/2024   HDL 39 (L) 02/07/2024   CHOLHDL 3.1 02/07/2024   VLDL 13 02/07/2024   LDLCALC 69 02/07/2024    Physical Findings: AIMS:  , ,  ,  ,    CIWA:    COWS:      Psychiatric Specialty Exam:  Presentation  General Appearance:  Casual  Eye Contact: Minimal  Speech: Normal Rate  Speech Volume: Decreased    Mood and Affect  Mood: Dysphoric  Affect: Flat   Thought Process  Thought Processes: Linear  Descriptions of Associations:Intact  Orientation:Partial  Thought Content:Illogical  Hallucinations: denies  Ideas of Reference:None  Suicidal Thoughts: denies  Homicidal Thoughts: denies   Sensorium  Memory: Immediate Fair; Recent Poor; Remote Poor  Judgment: Impaired  Insight: Shallow   Executive Functions  Concentration: Poor  Attention Span: Poor  Recall: Poor  Fund of Knowledge: Poor  Language: Poor   Psychomotor Activity  Psychomotor Activity: reduced  Musculoskeletal: Strength & Muscle Tone: within normal limits Gait & Station: normal Assets  Assets: Desire for Improvement; Resilience    Physical Exam: Physical Exam Vitals and nursing note reviewed.    Review of Systems  Constitutional: Negative.   Respiratory: Negative.     Blood pressure 117/63, pulse 79, temperature 98.8 F (37.1 C), temperature source Oral, resp. rate 17, height 5\' 10"  (1.778 m), weight 117.9 kg, SpO2 96%. Body mass index is 37.31 kg/m.  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  Patient is admitted for further safety evaluation and stabilization. Patient is displaying cognitive problems, unable to give any information on his medical hx, or psychiatric hx,  needing assistance with his ADLs.     Treatment Plan Summary:   Safety and Monitoring:             -- Voluntary admission to inpatient psychiatric unit for safety, stabilization and treatment             -- Daily contact with patient to assess and evaluate symptoms and progress in treatment             -- Patient's case to be discussed in multi-disciplinary team meeting             -- Observation Level: q15 minute checks             -- Vital signs:  q12 hours             -- Precautions: suicide, elopement, and assault   2. Psychiatric Diagnoses and Treatment:               Lexapro  10 mg daily  zyprexa   2.5 mg QAM and increase to 7.5mg  QHS Ativan  1 mg every 4 hour as needed needed catatonia-started in ED   -- The risks/benefits/side-effects/alternatives to this medication were discussed in detail with the patient and time was given for questions. The patient consents to medication trial.                -- Metabolic profile and EKG monitoring obtained while on an atypical antipsychotic (BMI: Lipid Panel: HbgA1c: QTc:)              -- Encouraged patient to participate in unit milieu and in scheduled group therapies                            3. Medical Issues Being Addressed:  No urgent medical needs identified   4. Discharge Planning: APS has been called to evaluate the need for guardianship as patient shows great difficulty in doing his ADLS, unable to recall and manage his medical problems  and psychiatric problems.             -- Social work and case management to assist with discharge planning and identification of hospital follow-up needs prior to discharge             -- Estimated LOS: 3-4days             -- Discharge Concerns: Need to establish a safety plan; Medication compliance and effectiveness             -- Discharge Goals: Return home with outpatient referrals follow ups   Physician Treatment Plan for Primary Diagnosis: MDD (major depressive disorder) Long Term Goal(s):  Improvement in symptoms so as ready for discharge   Short Term Goals: Ability to identify changes in lifestyle to reduce recurrence of condition will improve, Ability to verbalize feelings will improve, Ability to disclose and discuss suicidal ideas, Ability to demonstrate self-control will improve, and Ability to identify and develop effective coping behaviors will improve   Physician Treatment Plan for Secondary Diagnosis: Principal Problem:   MDD (major depressive disorder)   Long Term Goal(s): Improvement in symptoms so as ready for discharge   Short Term Goals: Ability to identify changes in lifestyle to reduce recurrence of condition will improve, Ability to verbalize feelings will improve, Ability to disclose and discuss suicidal ideas, Ability to demonstrate self-control will improve, Ability to identify and develop effective coping behaviors will improve, Ability to maintain clinical measurements within normal limits will improve, and Compliance with prescribed medications will improve  Aurelia Blotter, MD 02/20/2024, 12:28 PM

## 2024-02-21 DIAGNOSIS — F331 Major depressive disorder, recurrent, moderate: Secondary | ICD-10-CM | POA: Diagnosis not present

## 2024-02-21 NOTE — Progress Notes (Signed)
   02/21/24 0800  Psych Admission Type (Psych Patients Only)  Admission Status Voluntary  Psychosocial Assessment  Patient Complaints None  Eye Contact Brief  Facial Expression Flat  Affect Flat  Speech Soft  Interaction Minimal  Motor Activity Slow  Appearance/Hygiene In scrubs  Behavior Characteristics Cooperative  Mood Depressed  Thought Process  Coherency WDL  Content WDL  Delusions None reported or observed  Perception WDL  Hallucination None reported or observed  Judgment Impaired  Confusion Mild  Danger to Self  Current suicidal ideation? Denies  Agreement Not to Harm Self Yes  Description of Agreement verbal  Danger to Others  Danger to Others None reported or observed

## 2024-02-21 NOTE — Group Note (Signed)
 Date:  02/21/2024 Time:  4:22 PM  Group Topic/Focus: Group Activity- Bingo        Participation Level:  Active  Participation Quality:  Appropriate  Affect:  Appropriate  Cognitive:  Appropriate

## 2024-02-21 NOTE — Plan of Care (Signed)
   Problem: Education: Goal: Emotional status will improve Outcome: Progressing Goal: Mental status will improve Outcome: Progressing   Problem: Activity: Goal: Interest or engagement in activities will improve Outcome: Progressing Goal: Sleeping patterns will improve Outcome: Progressing   Problem: Coping: Goal: Ability to demonstrate self-control will improve Outcome: Progressing

## 2024-02-21 NOTE — Group Note (Signed)
 Physical/Occupational Therapy Group Note  Group Topic: Yoga  Group Date: 02/21/2024 Start Time: 1300 End Time: 1330 Facilitators: Danese Dorsainvil, Otelia Blew, PT   Group Description: Group participated with series of yoga poses, designed to emphasize functional sitting balance, core stability, generalized flexibility and overall posture.  Incorporated deep breathing techniques with poses, working to promote relaxation, mindfulness and focus with targeted activities.   Discussed benefits of yoga in improving mood and self-esteem, reducing stress and anxiety, and promoting functional strength and balance for each participant.  Discussed ways to integrate into each participant's daily routine.    Therapeutic Goal(s):  Demonstrate safe ability to participate with yoga poses during group activity. Identify one benefit of participation with yoga poses as part of each participant's exercise/movement routine. Identify 1-2 individual poses that participant feels most beneficial to his/her needs and that he/she can easily replicate outside of group.  Individual Participation: Pt pleasant, engaged, consistently appropriate, and actively participated throughout the session.   Participation Level: Active and Engaged   Participation Quality: Minimal Cues   Behavior: Appropriate   Speech/Thought Process: Focused   Affect/Mood: Appropriate   Insight: Good   Judgement: Good   Modes of Intervention: Activity and Discussion  Patient Response to Interventions:  Attentive, Engaged, and Interested    Plan: Continue to engage patient in PT/OT groups 1 - 2x/week.  Lavenia Post PT, DPT 02/21/24, 1:55 PM

## 2024-02-21 NOTE — Plan of Care (Signed)
   Problem: Education: Goal: Emotional status will improve Outcome: Progressing Goal: Mental status will improve Outcome: Progressing

## 2024-02-21 NOTE — Progress Notes (Signed)
 [facility] MD Progress Note  02/21/2024 1:37 PM Caleb Leblanc  MRN:  [id number]  Caleb Leblanc is a 63 y.o. male admitted: Presented to the ED on 01/31/2024  5:11 PM for altered mental status. He carries the psychiatric diagnoses of depression and has a past medical history of catatonia, OSA, constipation. His current presentation of despondence, hopeless, suicidal thoughts, flat affect is most consistent with depression.   Subjective:  Chart reviewed, case discussed in multidisciplinary meeting, patient seen during rounds.  Patient is noted to be sitting in the day area.  He reports having showered today morning.  He reports fair appetite and sleep.  He denies having any complaints or side effects to medication.  He denies feeling depressed or anxious.  He denies SI/HI/plan.  He denies hallucinations.  Per nursing report patient is visible on the unit and participate in the groups.  Social work team is working with Firefighter and Sports coach to assist him with some resources in the community as he seems to be having hard time managing his finances and has not established SSI or disability and is currently homeless.   Sleep: Fair  Appetite:  Fair  Past Psychiatric History: see h&P Family History: History reviewed. No pertinent family history. Social History:  Social History   Substance and Sexual Activity  Alcohol  Use Not Currently     Social History   Substance and Sexual Activity  Drug Use Never    Social History   Socioeconomic History   Marital status: Widowed    Spouse name: Not on file   Number of children: Not on file   Years of education: Not on file   Highest education level: Not on file  Occupational History   Not on file  Tobacco Use   Smoking status: Former    Current packs/day: 0.00    Types: Cigarettes    Quit date: 09/09/2017    Years since quitting: 6.4   Smokeless tobacco: Never   Tobacco comments:    pt states he smoked 4 cigs/day before he quit   Vaping Use   Vaping status: Never Used  Substance and Sexual Activity   Alcohol  use: Not Currently   Drug use: Never   Sexual activity: Not on file  Other Topics Concern   Not on file  Social History Narrative   Not on file   Social Drivers of Health   Financial Resource Strain: Not on file  Food Insecurity: Food Insecurity Present (02/04/2024)   Hunger Vital Sign    Worried About Running Out of Food in the Last Year: Often true    Ran Out of Food in the Last Year: Often true  Transportation Needs: Unmet Transportation Needs (02/04/2024)   PRAPARE - Administrator, Civil Service (Medical): Yes    Lack of Transportation (Non-Medical): Yes  Physical Activity: Not on file  Stress: Not on file  Social Connections: Socially Isolated (01/18/2024)   Social Connection and Isolation Panel [NHANES]    Frequency of Communication with Friends and Family: Never    Frequency of Social Gatherings with Friends and Family: Never    Attends Religious Services: Never    Database administrator or Organizations: No    Attends Engineer, structural: Never    Marital Status: Never married   Past Medical History:  Past Medical History:  Diagnosis Date   Hypertension     Past Surgical History:  Procedure Laterality Date   ABDOMINAL SURGERY  Current Medications: Current Facility-Administered Medications  Medication Dose Route Frequency Provider Last Rate Last Admin   acetaminophen  (TYLENOL ) tablet 650 mg  650 mg Oral Q6H PRN Motley-Mangrum, Jadeka A, PMHNP       alum & mag hydroxide-simeth (MAALOX/MYLANTA) 200-200-20 MG/5ML suspension 30 mL  30 mL Oral Q4H PRN Motley-Mangrum, Jadeka A, PMHNP       amLODipine  (NORVASC ) tablet 10 mg  10 mg Oral Daily Motley-Mangrum, Jadeka A, PMHNP   10 mg at 02/20/24 0900   haloperidol  (HALDOL ) tablet 5 mg  5 mg Oral TID PRN Motley-Mangrum, Jadeka A, PMHNP       And   diphenhydrAMINE  (BENADRYL ) capsule 50 mg  50 mg Oral TID PRN  Motley-Mangrum, Jadeka A, PMHNP       haloperidol  lactate (HALDOL ) injection 5 mg  5 mg Intramuscular TID PRN Motley-Mangrum, Jadeka A, PMHNP       And   diphenhydrAMINE  (BENADRYL ) injection 50 mg  50 mg Intramuscular TID PRN Motley-Mangrum, Jadeka A, PMHNP       And   LORazepam  (ATIVAN ) injection 2 mg  2 mg Intramuscular TID PRN Motley-Mangrum, Jadeka A, PMHNP       haloperidol  lactate (HALDOL ) injection 10 mg  10 mg Intramuscular TID PRN Motley-Mangrum, Jadeka A, PMHNP       And   diphenhydrAMINE  (BENADRYL ) injection 50 mg  50 mg Intramuscular TID PRN Motley-Mangrum, Jadeka A, PMHNP       And   LORazepam  (ATIVAN ) injection 2 mg  2 mg Intramuscular TID PRN Motley-Mangrum, Jadeka A, PMHNP       escitalopram  (LEXAPRO ) tablet 10 mg  10 mg Oral Daily Terrionna Bridwell, MD   10 mg at 02/21/24 1002   hydrOXYzine  (ATARAX ) tablet 25 mg  25 mg Oral TID PRN Motley-Mangrum, Jadeka A, PMHNP       influenza vac split trivalent PF (FLULAVAL) injection 0.5 mL  0.5 mL Intramuscular Tomorrow-1000 Aurelia Blotter, MD       lactulose  (CHRONULAC ) 10 GM/15ML solution 10 g  10 g Oral BID PRN Harold Moncus, MD       LORazepam  (ATIVAN ) tablet 1 mg  1 mg Oral Q4H PRN Motley-Mangrum, Jadeka A, PMHNP       magnesium  hydroxide (MILK OF MAGNESIA) suspension 30 mL  30 mL Oral Daily PRN Motley-Mangrum, Jadeka A, PMHNP       OLANZapine  (ZYPREXA ) tablet 2.5 mg  2.5 mg Oral q AM Matteson Blue, MD   2.5 mg at 02/21/24 1002   OLANZapine  (ZYPREXA ) tablet 7.5 mg  7.5 mg Oral QPM Jennavie Martinek, MD   7.5 mg at 02/20/24 1713   traZODone  (DESYREL ) tablet 50 mg  50 mg Oral QHS PRN Motley-Mangrum, Jadeka A, PMHNP   50 mg at 02/20/24 2118    Lab Results:  No results found for this or any previous visit (from the past 48 hours).   Blood Alcohol  level:  Lab Results  Component Value Date   [facility] <15 01/31/2024   ETH <10 01/29/2024    Metabolic Disorder Labs: Lab Results  Component Value Date   HGBA1C 6.0 (H) 02/07/2024    MPG 125.5 02/07/2024   No results found for: "PROLACTIN" Lab Results  Component Value Date   CHOL 121 02/07/2024   TRIG 64 02/07/2024   HDL 39 (L) 02/07/2024   CHOLHDL 3.1 02/07/2024   VLDL 13 02/07/2024   LDLCALC 69 02/07/2024    Physical Findings: AIMS:  , ,  ,  ,    CIWA:  COWS:      Psychiatric Specialty Exam:  Presentation  General Appearance:  Casual  Eye Contact: Fair  Speech: Normal Rate  Speech Volume: Normal    Mood and Affect  Mood: Euthymic  Affect: Appropriate   Thought Process  Thought Processes: Coherent  Descriptions of Associations:Intact  Orientation:Partial  Thought Content:Illogical  Hallucinations: denies  Ideas of Reference:None  Suicidal Thoughts: denies  Homicidal Thoughts: denies   Sensorium  Memory: Immediate Fair; Recent Poor; Remote Poor  Judgment: Impaired  Insight: Shallow   Executive Functions  Concentration: Fair  Attention Span: Fair  Recall: Poor  Fund of Knowledge: Poor  Language: Fair   Psychomotor Activity  Psychomotor Activity: reduced  Musculoskeletal: Strength & Muscle Tone: within normal limits Gait & Station: normal Assets  Assets: Desire for Improvement; Resilience    Physical Exam: Physical Exam Vitals and nursing note reviewed.    Review of Systems  Constitutional: Negative.   Respiratory: Negative.     Blood pressure 104/60, pulse 83, temperature 98.2 F (36.8 C), resp. rate 14, height 5\' 10"  (1.778 m), weight 117.9 kg, SpO2 94%. Body mass index is 37.31 kg/m.  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  Patient is admitted for further safety evaluation and stabilization. Patient is displaying cognitive problems, unable  to give any information on his medical hx, or psychiatric hx, needing assistance with his ADLs.     Treatment Plan Summary:   Safety and Monitoring:             -- Voluntary admission to inpatient psychiatric unit for safety, stabilization and treatment             -- Daily contact with patient to assess and evaluate symptoms and progress in treatment             -- Patient's case to be discussed in multi-disciplinary team meeting             -- Observation Level: q15 minute checks             -- Vital signs:  q12 hours             -- Precautions: suicide, elopement, and assault   2. Psychiatric Diagnoses and Treatment:               Lexapro  10 mg daily  zyprexa   2.5 mg QAM and increase to 7.5mg  QHS Ativan  1 mg every 4 hour as needed needed catatonia-started in ED   -- The risks/benefits/side-effects/alternatives to this medication were discussed in detail with the patient and time was given for questions. The patient consents to medication trial.                -- Metabolic profile and EKG monitoring obtained while on an atypical antipsychotic (BMI: Lipid Panel: HbgA1c: QTc:)              -- Encouraged patient to participate in unit milieu and in scheduled group therapies                            3. Medical Issues Being Addressed:  No urgent medical needs identified   4. Discharge Planning: APS has been called to evaluate the need for guardianship as patient shows great difficulty  in doing his ADLS, unable to recall and manage his medical problems and psychiatric problems.             -- Social work and case management to assist with discharge planning and identification of hospital follow-up needs prior to discharge             -- Estimated LOS: 3-4days             -- Discharge Concerns: Need to establish a safety plan; Medication compliance and effectiveness             -- Discharge Goals: Return home with outpatient referrals follow ups   Physician Treatment Plan for Primary  Diagnosis: MDD (major depressive disorder) Long Term Goal(s): Improvement in symptoms so as ready for discharge   Short Term Goals: Ability to identify changes in lifestyle to reduce recurrence of condition will improve, Ability to verbalize feelings will improve, Ability to disclose and discuss suicidal ideas, Ability to demonstrate self-control will improve, and Ability to identify and develop effective coping behaviors will improve   Physician Treatment Plan for Secondary Diagnosis: Principal Problem:   MDD (major depressive disorder)   Long Term Goal(s): Improvement in symptoms so as ready for discharge   Short Term Goals: Ability to identify changes in lifestyle to reduce recurrence of condition will improve, Ability to verbalize feelings will improve, Ability to disclose and discuss suicidal ideas, Ability to demonstrate self-control will improve, Ability to identify and develop effective coping behaviors will improve, Ability to maintain clinical measurements within normal limits will improve, and Compliance with prescribed medications will improve  Aurelia Blotter, MD 02/21/2024, 1:37 PM

## 2024-02-22 ENCOUNTER — Other Ambulatory Visit: Payer: Self-pay

## 2024-02-22 DIAGNOSIS — F331 Major depressive disorder, recurrent, moderate: Secondary | ICD-10-CM | POA: Diagnosis not present

## 2024-02-22 MED ORDER — OLANZAPINE 2.5 MG PO TABS
2.5000 mg | ORAL_TABLET | Freq: Every morning | ORAL | 0 refills | Status: DC
  Filled 2024-02-22: qty 30, 30d supply, fill #0

## 2024-02-22 MED ORDER — TRAZODONE HCL 50 MG PO TABS
50.0000 mg | ORAL_TABLET | Freq: Every evening | ORAL | 0 refills | Status: DC | PRN
  Filled 2024-02-22: qty 30, 30d supply, fill #0

## 2024-02-22 MED ORDER — ESCITALOPRAM OXALATE 10 MG PO TABS
10.0000 mg | ORAL_TABLET | Freq: Every day | ORAL | 0 refills | Status: DC
  Filled 2024-02-22: qty 30, 30d supply, fill #0

## 2024-02-22 MED ORDER — OLANZAPINE 5 MG PO TABS
7.5000 mg | ORAL_TABLET | Freq: Every evening | ORAL | 0 refills | Status: DC
  Filled 2024-02-22: qty 45, 30d supply, fill #0

## 2024-02-22 NOTE — [facility] Counselor (Signed)
 CSW reached out to financial counseling.   Caleb Leblanc, MSW, LCSW 02/22/2024 2:49 PM

## 2024-02-22 NOTE — Group Note (Signed)
 Recreation Therapy Group Note   Group Topic:Relaxation  Group Date: 02/22/2024 Start Time: 1100 End Time: 1140 Facilitators: Deatrice Factor, LRT, CTRS Location: Dayroom  Group Description: PMR (Progressive Muscle Relaxation). LRT educates patients on what PMR is and the benefits that come from it. Patients are asked to sit with their feet flat on the floor while sitting up and all the way back in their chair, if possible. LRT and pts follow a prompt through a speaker that requires you to tense and release different muscles in their body and focus on their breathing. During session, lights are off and soft music is being played. Pts are given a stress ball to use if needed.   Goal Area(s) Addressed:  Patients will be able to describe progressive muscle relaxation.  Patient will practice using relaxation technique. Patient will identify a new coping skill.  Patient will follow multistep directions to reduce anxiety and stress.   Affect/Mood: Appropriate   Participation Level: Active and Engaged   Participation Quality: Independent   Behavior: Appropriate, Calm, and Cooperative   Speech/Thought Process: Coherent   Insight: Good   Judgement: Good   Modes of Intervention: Education and Exploration   Patient Response to Interventions:  Attentive, Engaged, and Receptive   Education Outcome:  Acknowledges education   Clinical Observations/Individualized Feedback: Caleb Leblanc was active in their participation of session activities and group discussion. Pt completed all exercises as prompted. Pt minimally interacted with LRT and peers while present.    Plan: Continue to engage patient in RT group sessions 2-3x/week.   Deatrice Factor, LRT, CTRS 02/22/2024 1:38 PM

## 2024-02-22 NOTE — Progress Notes (Signed)
 [facility] MD Progress Note  02/22/2024 3:45 PM Caleb Leblanc  MRN:  [id number]  Caleb Leblanc is a 63 y.o. male admitted: Presented to the ED on 01/31/2024  5:11 PM for altered mental status. He carries the psychiatric diagnoses of depression and has a past medical history of catatonia, OSA, constipation. His current presentation of despondence, hopeless, suicidal thoughts, flat affect is most consistent with depression.   Subjective:  Chart reviewed, case discussed in multidisciplinary meeting, patient seen during rounds.  Today on interview patient is noted to be sitting in the day area.  He offers no complaints.  He has fair appetite and sleep.  He denies having any side effects to medications.  Per nursing report patient remains calm and has no behavioral problems.  Discussed with the treatment team about possible discharge tomorrow.  Patient denies hallucinations and denies SI/HI/plan.  He expressed his understanding of the possibility of him going to a shelter upon discharge.  Sleep: Fair  Appetite:  Fair  Past Psychiatric History: see h&P Family History: History reviewed. No pertinent family history. Social History:  Social History   Substance and Sexual Activity  Alcohol  Use Not Currently     Social History   Substance and Sexual Activity  Drug Use Never    Social History   Socioeconomic History   Marital status: Widowed    Spouse name: Not on file   Number of children: Not on file   Years of education: Not on file   Highest education level: Not on file  Occupational History   Not on file  Tobacco Use   Smoking status: Former    Current packs/day: 0.00    Types: Cigarettes    Quit date: 09/09/2017    Years since quitting: 6.4   Smokeless tobacco: Never   Tobacco comments:    pt states he smoked 4 cigs/day before he quit  Vaping Use   Vaping status: Never Used  Substance and Sexual Activity   Alcohol  use: Not Currently   Drug use: Never   Sexual activity: Not on  file  Other Topics Concern   Not on file  Social History Narrative   Not on file   Social Drivers of Health   Financial Resource Strain: Not on file  Food Insecurity: Food Insecurity Present (02/04/2024)   Hunger Vital Sign    Worried About Running Out of Food in the Last Year: Often true    Ran Out of Food in the Last Year: Often true  Transportation Needs: Unmet Transportation Needs (02/04/2024)   PRAPARE - Administrator, Civil Service (Medical): Yes    Lack of Transportation (Non-Medical): Yes  Physical Activity: Not on file  Stress: Not on file  Social Connections: Socially Isolated (01/18/2024)   Social Connection and Isolation Panel [NHANES]    Frequency of Communication with Friends and Family: Never    Frequency of Social Gatherings with Friends and Family: Never    Attends Religious Services: Never    Database administrator or Organizations: No    Attends Engineer, structural: Never    Marital Status: Never married   Past Medical History:  Past Medical History:  Diagnosis Date   Hypertension     Past Surgical History:  Procedure Laterality Date   ABDOMINAL SURGERY      Current Medications: Current Facility-Administered Medications  Medication Dose Route Frequency Provider Last Rate Last Admin   acetaminophen  (TYLENOL ) tablet 650 mg  650 mg Oral  Q6H PRN Motley-Mangrum, Jadeka A, PMHNP       alum & mag hydroxide-simeth (MAALOX/MYLANTA) 200-200-20 MG/5ML suspension 30 mL  30 mL Oral Q4H PRN Motley-Mangrum, Jadeka A, PMHNP       amLODipine  (NORVASC ) tablet 10 mg  10 mg Oral Daily Motley-Mangrum, Jadeka A, PMHNP   10 mg at 02/22/24 0935   haloperidol  (HALDOL ) tablet 5 mg  5 mg Oral TID PRN Motley-Mangrum, Jadeka A, PMHNP       And   diphenhydrAMINE  (BENADRYL ) capsule 50 mg  50 mg Oral TID PRN Motley-Mangrum, Jadeka A, PMHNP       haloperidol  lactate (HALDOL ) injection 5 mg  5 mg Intramuscular TID PRN Motley-Mangrum, Jadeka A, PMHNP       And    diphenhydrAMINE  (BENADRYL ) injection 50 mg  50 mg Intramuscular TID PRN Motley-Mangrum, Jadeka A, PMHNP       And   LORazepam  (ATIVAN ) injection 2 mg  2 mg Intramuscular TID PRN Motley-Mangrum, Jadeka A, PMHNP       haloperidol  lactate (HALDOL ) injection 10 mg  10 mg Intramuscular TID PRN Motley-Mangrum, Jadeka A, PMHNP       And   diphenhydrAMINE  (BENADRYL ) injection 50 mg  50 mg Intramuscular TID PRN Motley-Mangrum, Jadeka A, PMHNP       And   LORazepam  (ATIVAN ) injection 2 mg  2 mg Intramuscular TID PRN Motley-Mangrum, Jadeka A, PMHNP       escitalopram  (LEXAPRO ) tablet 10 mg  10 mg Oral Daily Jissell Trafton, MD   10 mg at 02/22/24 1610   hydrOXYzine  (ATARAX ) tablet 25 mg  25 mg Oral TID PRN Motley-Mangrum, Jadeka A, PMHNP       influenza vac split trivalent PF (FLULAVAL) injection 0.5 mL  0.5 mL Intramuscular Tomorrow-1000 Aurelia Blotter, MD       lactulose  (CHRONULAC ) 10 GM/15ML solution 10 g  10 g Oral BID PRN Shevelle Smither, MD       LORazepam  (ATIVAN ) tablet 1 mg  1 mg Oral Q4H PRN Motley-Mangrum, Jadeka A, PMHNP       magnesium  hydroxide (MILK OF MAGNESIA) suspension 30 mL  30 mL Oral Daily PRN Motley-Mangrum, Jadeka A, PMHNP       OLANZapine  (ZYPREXA ) tablet 2.5 mg  2.5 mg Oral q AM Chianti Goh, MD   2.5 mg at 02/22/24 0935   OLANZapine  (ZYPREXA ) tablet 7.5 mg  7.5 mg Oral QPM Corrie Brannen, MD   7.5 mg at 02/21/24 1730   traZODone  (DESYREL ) tablet 50 mg  50 mg Oral QHS PRN Motley-Mangrum, Jadeka A, PMHNP   50 mg at 02/21/24 2120    Lab Results:  No results found for this or any previous visit (from the past 48 hours).   Blood Alcohol  level:  Lab Results  Component Value Date   [facility] <15 01/31/2024   ETH <10 01/29/2024    Metabolic Disorder Labs: Lab Results  Component Value Date   HGBA1C 6.0 (H) 02/07/2024   MPG 125.5 02/07/2024   No results found for: "PROLACTIN" Lab Results  Component Value Date   CHOL 121 02/07/2024   TRIG 64 02/07/2024   HDL 39 (L)  02/07/2024   CHOLHDL 3.1 02/07/2024   VLDL 13 02/07/2024   LDLCALC 69 02/07/2024    Physical Findings: AIMS:  , ,  ,  ,    CIWA:    COWS:      Psychiatric Specialty Exam:  Presentation  General Appearance:  Casual  Eye Contact: Fair  Speech: Normal  Rate  Speech Volume: Normal    Mood and Affect  Mood: Euthymic  Affect: Appropriate   Thought Process  Thought Processes: Coherent  Descriptions of Associations:Intact  Orientation:Partial  Thought Content:Illogical  Hallucinations: denies  Ideas of Reference:None  Suicidal Thoughts: denies  Homicidal Thoughts: denies   Sensorium  Memory: Immediate Fair; Recent Poor; Remote Poor  Judgment: Impaired  Insight: Shallow   Executive Functions  Concentration: Fair  Attention Span: Fair  Recall: Poor  Fund of Knowledge: Poor  Language: Fair   Psychomotor Activity  Psychomotor Activity: reduced  Musculoskeletal: Strength & Muscle Tone: within normal limits Gait & Station: normal Assets  Assets: Desire for Improvement; Resilience    Physical Exam: Physical Exam Vitals and nursing note reviewed.    Review of Systems  Constitutional: Negative.   Respiratory: Negative.     Blood pressure 132/82, pulse 94, temperature 98.1 F (36.7 C), resp. rate 18, height 5\' 10"  (1.778 m), weight 117.9 kg, SpO2 93%. Body mass index is 37.31 kg/m.  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  Patient is admitted for further safety evaluation and stabilization. Patient is displaying cognitive problems, unable to give any information on his medical hx, or psychiatric hx, needing assistance with his ADLs.     Treatment Plan Summary:   Safety and  Monitoring:             -- Voluntary admission to inpatient psychiatric unit for safety, stabilization and treatment             -- Daily contact with patient to assess and evaluate symptoms and progress in treatment             -- Patient's case to be discussed in multi-disciplinary team meeting             -- Observation Level: q15 minute checks             -- Vital signs:  q12 hours             -- Precautions: suicide, elopement, and assault   2. Psychiatric Diagnoses and Treatment:               Lexapro  10 mg daily  zyprexa   2.5 mg QAM and increase to 7.5mg  QHS Ativan  1 mg every 4 hour as needed needed catatonia-started in ED   -- The risks/benefits/side-effects/alternatives to this medication were discussed in detail with the patient and time was given for questions. The patient consents to medication trial.                -- Metabolic profile and EKG monitoring obtained while on an atypical antipsychotic (BMI: Lipid Panel: HbgA1c: QTc:)              -- Encouraged patient to participate in unit milieu and in scheduled group therapies                            3. Medical Issues Being Addressed:  No urgent medical needs identified   4. Discharge Planning: APS has been called to evaluate the need for guardianship as patient shows great difficulty in doing his ADLS, unable to recall and manage his medical problems and psychiatric problems.             --  Social work and case management to assist with discharge planning and identification of hospital follow-up needs prior to discharge             -- Estimated LOS: 3-4days             -- Discharge Concerns: Need to establish a safety plan; Medication compliance and effectiveness             -- Discharge Goals: Return home with outpatient referrals follow ups   Physician Treatment Plan for Primary Diagnosis: MDD (major depressive disorder) Long Term Goal(s): Improvement in symptoms so as ready for discharge   Short Term Goals: Ability  to identify changes in lifestyle to reduce recurrence of condition will improve, Ability to verbalize feelings will improve, Ability to disclose and discuss suicidal ideas, Ability to demonstrate self-control will improve, and Ability to identify and develop effective coping behaviors will improve   Physician Treatment Plan for Secondary Diagnosis: Principal Problem:   MDD (major depressive disorder)   Long Term Goal(s): Improvement in symptoms so as ready for discharge   Short Term Goals: Ability to identify changes in lifestyle to reduce recurrence of condition will improve, Ability to verbalize feelings will improve, Ability to disclose and discuss suicidal ideas, Ability to demonstrate self-control will improve, Ability to identify and develop effective coping behaviors will improve, Ability to maintain clinical measurements within normal limits will improve, and Compliance with prescribed medications will improve  Aurelia Blotter, MD 02/22/2024, 3:45 PM

## 2024-02-22 NOTE — Group Note (Signed)
 Date:  02/22/2024 Time:  9:12 PM  Group Topic/Focus:  Making Healthy Choices:   The focus of this group is to help patients identify negative/unhealthy choices they were using prior to admission and identify positive/healthier coping strategies to replace them upon discharge.    Participation Level:  Active  Participation Quality:  Appropriate  Affect:  Appropriate  Cognitive:  Appropriate  Insight: Good  Engagement in Group:  Engaged  Modes of Intervention:  Discussion  Additional Comments:    Lynette Saras 02/22/2024, 9:12 PM

## 2024-02-22 NOTE — Group Note (Signed)
 Recreation Therapy Group Note   Group Topic:Stress Management  Group Date: 02/22/2024 Start Time: 1500 End Time: 1550 Facilitators: Deatrice Factor, LRT, CTRS Location: Courtyard  Group Description: Outdoor Recreation. Patients had the option to play corn hole, ring toss, bowling or listening to music while outside in the courtyard getting fresh air and sunlight. Patients helped water and prune the raised garden beds. LRT and patients discussed things that they enjoy doing in their free time outside of the hospital. LRT encouraged patients to drink water after being active and getting their heart rate up.   Goal Area(s) Addressed: Patient will identify leisure interests.  Patient will practice healthy decision making. Patient will engage in recreation activity.   Affect/Mood: Appropriate   Participation Level: Active   Participation Quality: Independent   Behavior: Appropriate   Speech/Thought Process: Coherent   Insight: Fair   Judgement: Fair    Modes of Intervention: Activity   Patient Response to Interventions:  Receptive   Education Outcome:  Acknowledges education   Clinical Observations/Individualized Feedback: Dino was active in their participation of session activities and group discussion. Pt interacted well with LRT and peers duration of session.    Plan: Continue to engage patient in RT group sessions 2-3x/week.   Deatrice Factor, LRT, CTRS 02/22/2024 5:10 PM

## 2024-02-22 NOTE — Progress Notes (Signed)
   02/22/24 2030  Psych Admission Type (Psych Patients Only)  Admission Status Voluntary  Psychosocial Assessment  Patient Complaints None  Eye Contact Fair  Facial Expression Flat  Affect Flat  Speech Logical/coherent  Interaction Minimal  Motor Activity Slow  Appearance/Hygiene In scrubs;Body odor  Behavior Characteristics Cooperative  Mood Depressed  Thought Process  Coherency WDL  Content WDL  Delusions None reported or observed  Perception WDL  Hallucination None reported or observed  Judgment Limited  Confusion Mild  Danger to Self  Current suicidal ideation? Denies  Danger to Others  Danger to Others None reported or observed   Progress note   D: Pt seen in dayroom. Seen in milieu and some interaction with other peers. Pt denies SI, HI, AVH. Pt rates pain  0/10. Pt rates anxiety  0/10 and depression  0/10. Eating and sleeping well. No other concerns noted at this time.  A: Pt provided support and encouragement. Pt given scheduled medication as prescribed. PRNs as appropriate. Q15 min checks for safety.   R: Pt safe on the unit. Will continue to monitor.

## 2024-02-22 NOTE — Progress Notes (Signed)
   02/21/24 2100  Psych Admission Type (Psych Patients Only)  Admission Status Voluntary  Psychosocial Assessment  Patient Complaints None  Eye Contact Brief  Facial Expression Flat  Affect Flat  Speech Soft  Interaction Minimal  Motor Activity Slow  Appearance/Hygiene In scrubs  Behavior Characteristics Cooperative  Mood Depressed  Thought Process  Coherency WDL  Content WDL  Delusions None reported or observed  Perception WDL  Hallucination None reported or observed  Judgment Impaired  Confusion Mild  Danger to Self  Current suicidal ideation? Denies  Danger to Others  Danger to Others None reported or observed   Progress note   D: Pt seen in dayroom. Pt denies SI, HI, AVH. Pt rates pain  0/10. Pt rates anxiety  0/10 and depression  0/10. Pt states he is eating at least half of his meals and is sleeping well. No other concerns noted at this time.  A: Pt provided support and encouragement. Pt given scheduled medication as prescribed. PRNs as appropriate. Q15 min checks for safety.   R: Pt safe on the unit. Will continue to monitor.

## 2024-02-22 NOTE — Plan of Care (Signed)
  Problem: Education: Goal: Emotional status will improve Outcome: Progressing Goal: Mental status will improve Outcome: Progressing Goal: Verbalization of understanding the information provided will improve Outcome: Progressing   Problem: Activity: Goal: Sleeping patterns will improve Outcome: Progressing   Problem: Coping: Goal: Ability to demonstrate self-control will improve Outcome: Progressing   Problem: Health Behavior/Discharge Planning: Goal: Compliance with treatment plan for underlying cause of condition will improve Outcome: Progressing

## 2024-02-22 NOTE — Progress Notes (Signed)
   02/22/24 0732  Psych Admission Type (Psych Patients Only)  Admission Status Voluntary  Psychosocial Assessment  Patient Complaints None  Eye Contact Fair  Facial Expression Blank  Affect Flat  Speech Logical/coherent  Interaction Minimal  Motor Activity Slow  Appearance/Hygiene In scrubs  Behavior Characteristics Cooperative  Mood Sullen  Thought Process  Coherency WDL  Content WDL  Delusions None reported or observed  Perception WDL  Hallucination None reported or observed  Judgment Impaired  Confusion Mild  Danger to Self  Current suicidal ideation? Denies  Danger to Others  Danger to Others None reported or observed

## 2024-02-23 ENCOUNTER — Other Ambulatory Visit: Payer: Self-pay

## 2024-02-23 NOTE — Plan of Care (Signed)
  Problem: Education: Goal: Emotional status will improve Outcome: Progressing Goal: Mental status will improve Outcome: Progressing Goal: Verbalization of understanding the information provided will improve Outcome: Progressing   Problem: Coping: Goal: Ability to demonstrate self-control will improve Outcome: Progressing   Problem: Health Behavior/Discharge Planning: Goal: Compliance with treatment plan for underlying cause of condition will improve Outcome: Progressing   Problem: Physical Regulation: Goal: Ability to maintain clinical measurements within normal limits will improve Outcome: Progressing   Problem: Safety: Goal: Periods of time without injury will increase Outcome: Progressing

## 2024-02-23 NOTE — Progress Notes (Signed)
 Patient was discharged from [facility] unit at approx 1325 escorted by staff. Patient denies SI/HI/AVH. Discharge packet to include printed AVS, outpatient medications, Suicide Risk Assessment, and Transition Record reviewed with patient. Belongings to include eyeglasses returned. Suicide safety plan unable to be completed.

## 2024-02-23 NOTE — Group Note (Signed)
 Physical/Occupational Therapy Group Note  Group Topic: UE Therex   Group Date: 02/23/2024 Start Time: 1300 End Time: 1345 Facilitators: Hilma Lucks, OT   Group Description: Group discussed impact of chronic/acute pain on safety and independence with functional tasks and impact on mental health.  Identified and discussed any previously learned or implemented strategies used.  Discussed and reviewed cognitive behavioral pain coping strategies to address/improve overall management of pain. Discussed relaxation, distraction techniques, cognitive restructuring, activity pacing/energy conservation, environment/home safety modifications, and role of sleep and sleep hygiene. Allowed time for questions and further discussion.  Therapeutic Goal(s):  Identify and discuss previously utilized pain coping strategies and implications of pain on function/well-being Identify and discuss implementing new cognitive behavioral pain coping strategies into daily routines Demonstrate understanding and performance of learned cognitive behavioral pain coping strategies  Individual Participation: Pt active and engaged for time present during session. Pt notified of discharge approx in to the group and then was assisted off unit. While in group, pt required PRN multimodal cues to assist with UE therex technique for 2/4 exercises he completed with good carryover.    Participation Level: Active and Engaged   Participation Quality: Minimal Cues   Behavior: Alert, Appropriate, Attentive , and Calm   Speech/Thought Process: Organized and Relevant   Affect/Mood: Appropriate and Stable    Insight: Moderate and Good   Judgement: Fair, Moderate, and Good   Modes of Intervention: Activity, Clarification, Discussion, Education, Problem-solving, and Socialization  Patient Response to Interventions:  Engaged, Interested , Receptive    Plan: Pt discharged from unit during group.   Agam Davenport R., MPH, MS, OTR/L ascom  [phone] 02/23/24, 2:40 PM

## 2024-02-23 NOTE — BHH Counselor (Signed)
 CSW checked with [employer].  No male beds available at this time, facility closed due to facility taking residents to dentist.  Shasta Deist, MSW, LCSW 02/23/2024 11:10 AM

## 2024-02-23 NOTE — Discharge Summary (Signed)
 Physician Discharge Summary Note  Patient:  Caleb Leblanc is an 63 y.o., male [name] DOB:  08/02/1961 Patient phone:  [phone] (home)  Patient address:   [address] [place] [address],    Date of Admission:  02/04/2024 Date of Discharge: 02/23/24  Reason for Admission:  SHI BAYERL is a 63 y.o. male admitted: Presented to the ED on 01/31/2024  5:11 PM for altered mental status. He carries the psychiatric diagnoses of depression and has a past medical history of catatonia, OSA, constipation. His current presentation of despondence, hopeless, suicidal thoughts, flat affect is most consistent with depression Patient is admitted to [facility] psych unit with Q15 min safety monitoring. Multidisciplinary team approach is offered. Medication management; group/milieu therapy is offered.   Principal Problem: MDD (major depressive disorder) Discharge Diagnoses: Principal Problem:   MDD (major depressive disorder)   Past Psychiatric History: see h&p  Family Psychiatric  History: see h&p Social History:  Social History   Substance and Sexual Activity  Alcohol  Use Not Currently     Social History   Substance and Sexual Activity  Drug Use Never    Social History   Socioeconomic History   Marital status: Widowed    Spouse name: Not on file   Number of children: Not on file   Years of education: Not on file   Highest education level: Not on file  Occupational History   Not on file  Tobacco Use   Smoking status: Former    Current packs/day: 0.00    Types: Cigarettes    Quit date: 09/09/2017    Years since quitting: 6.4   Smokeless tobacco: Never   Tobacco comments:    pt states he smoked 4 cigs/day before he quit  Vaping Use   Vaping status: Never Used  Substance and Sexual Activity   Alcohol  use: Not Currently   Drug use: Never   Sexual activity: Not on file  Other Topics Concern   Not on file  Social History Narrative   Not on file   Social Drivers of  Health   Financial Resource Strain: Not on file  Food Insecurity: Food Insecurity Present (02/04/2024)   Hunger Vital Sign    Worried About Running Out of Food in the Last Year: Often true    Ran Out of Food in the Last Year: Often true  Transportation Needs: Unmet Transportation Needs (02/04/2024)   PRAPARE - Administrator, Civil Service (Medical): Yes    Lack of Transportation (Non-Medical): Yes  Physical Activity: Not on file  Stress: Not on file  Social Connections: Socially Isolated (01/18/2024)   Social Connection and Isolation Panel [NHANES]    Frequency of Communication with Friends and Family: Never    Frequency of Social Gatherings with Friends and Family: Never    Attends Religious Services: Never    Database administrator or Organizations: No    Attends Engineer, structural: Never    Marital Status: Never married   Past Medical History:  Past Medical History:  Diagnosis Date   Hypertension     Past Surgical History:  Procedure Laterality Date   ABDOMINAL SURGERY     Family History: History reviewed. No pertinent family history.  Hospital Course:  Caleb Leblanc is a 63 y.o. male admitted: Presented to the ED on 01/31/2024  5:11 PM for altered mental status. He carries the psychiatric diagnoses of depression and has a past medical history of catatonia, OSA,  constipation. His current presentation of despondence, hopeless, suicidal thoughts, flat affect is most consistent with depression Patient is admitted to [facility] psych unit with Q15 min safety monitoring. Multidisciplinary team approach is offered. Medication management; group/milieu therapy is offered.  On admission patient was started on Lexapro  10 mg for depression and Zyprexa  for psychosis and mood stabilization as he endorsed depression and auditory hallucinations with suicidal ideation.  Patient tolerated medications very well.  His Zyprexa  dose was titrated to 2.5 mg every morning and 7.5 mg  nightly.  Patient tolerated medications very well with no reported side effects of EPS.  During the stay patient responded well and reported improvement in his depression and resolution of the hallucinations and suicidal thoughts.  On the day of discharge patient remains future oriented and is willing to participate in outpatient mental health services.  Patient understood that he is homeless and has no financial aid to look for any resources.  Social work team offered [employer] as a Sales promotion account executive and he agreed with the plan.  Medications were filled out for him for of 30-day supply.  On the day of discharge he consistently denied SI/HI/plan.  Denies auditory/visual hallucinations.  Physical Findings: AIMS:  , ,  ,  ,    CIWA:    COWS:        Psychiatric Specialty Exam:  Presentation  General Appearance:  Casual  Eye Contact: Fair  Speech: Normal Rate  Speech Volume: Normal    Mood and Affect  Mood: Euthymic  Affect: Appropriate   Thought Process  Thought Processes: Coherent  Descriptions of Associations:Intact  Orientation:Partial  Thought Content:Illogical  Hallucinations:No data recorded Ideas of Reference:None  Suicidal Thoughts:No data recorded Homicidal Thoughts:No data recorded  Sensorium  Memory: Immediate Fair; Recent Poor; Remote Poor  Judgment: Impaired  Insight: Shallow   Executive Functions  Concentration: Fair  Attention Span: Fair  Recall: Poor  Fund of Knowledge: Poor  Language: Fair   Psychomotor Activity  Psychomotor Activity:No data recorded Musculoskeletal: Strength & Muscle Tone: within normal limits Gait & Station: normal Assets  Assets: Desire for Improvement; Resilience   Sleep  Sleep:No data recorded   Physical Exam: Physical Exam ROS Blood pressure 130/81, pulse 88, temperature 98.1 F (36.7 C), resp. rate 18, height 5\' 10"  (1.778 m), weight 117.9 kg, SpO2 98%. Body mass index is 37.31  kg/m.   Social History   Tobacco Use  Smoking Status Former   Current packs/day: 0.00   Types: Cigarettes   Quit date: 09/09/2017   Years since quitting: 6.4  Smokeless Tobacco Never  Tobacco Comments   pt states he smoked 4 cigs/day before he quit   Tobacco Cessation:  N/A, patient does not currently use tobacco products   Blood Alcohol  level:  Lab Results  Component Value Date   [facility] <15 01/31/2024   ETH <10 01/29/2024    Metabolic Disorder Labs:  Lab Results  Component Value Date   HGBA1C 6.0 (H) 02/07/2024   MPG 125.5 02/07/2024   No results found for: "PROLACTIN" Lab Results  Component Value Date   CHOL 121 02/07/2024   TRIG 64 02/07/2024   HDL 39 (L) 02/07/2024   CHOLHDL 3.1 02/07/2024   VLDL 13 02/07/2024   LDLCALC 69 02/07/2024    See Psychiatric Specialty Exam and Suicide Risk Assessment completed by Attending Physician prior to discharge.  Discharge destination:  Other:  Shelter  Is patient on multiple antipsychotic therapies at discharge:  No   Has Patient had three  or more failed trials of antipsychotic monotherapy by history:  No  Recommended Plan for Multiple Antipsychotic Therapies: NA  Discharge Instructions     Diet - low sodium heart healthy   Complete by: As directed    Increase activity slowly   Complete by: As directed    Increase activity slowly   Complete by: As directed       Allergies as of 02/23/2024       Reactions   Augmentin [amoxicillin-pot Clavulanate] Anaphylaxis        Medication List     TAKE these medications      Indication  amLODipine  10 MG tablet Commonly known as: NORVASC  Take 10 mg by mouth daily.    escitalopram  10 MG tablet Commonly known as: LEXAPRO  Take 1 tablet (10 mg total) by mouth daily.    OLANZapine  5 MG tablet Commonly known as: ZYPREXA  Take 1.5 tablets (7.5 mg total) by mouth every evening. What changed: You were already taking a medication with the same name, and this  prescription was added. Make sure you understand how and when to take each.    OLANZapine  2.5 MG tablet Commonly known as: ZYPREXA  Take 1 tablet (2.5 mg total) by mouth in the morning. What changed:  medication strength how much to take when to take this    traZODone  50 MG tablet Commonly known as: DESYREL  Take 1 tablet (50 mg total) by mouth at bedtime as needed for sleep.         Follow-up Information     [facility] Follow up.   Specialty: Urgent Care Why: Therapy walk in hours are from: Monday 8am - 1pm, Tuesday 8am-1pm, Wednesday 8am-1pm, Friday 1pm-4pm. Psychiatry walk in are: Monday 8am - 11am, Tuesday 8am-11am, Wednesday 8am-11am, Thursday 8am-11am and Friday 8am-10am.  Appointments for both are first come first serve, please arrive early. Contact information: [address] [place]  [phone]        [facility] Follow up.   Specialty: Professional Counselor Why: Walk in hours are from 9AM to 12PM Monday through Friday.  Appointments are first come first serve, please arrive early. Contact information: [facility] [address] [place]  [address] [place] [address] [phone]                 Follow-up recommendations:  Activity:  As tolerated    Signed: Neyda Durango, MD 02/23/2024, 12:25 PM

## 2024-02-23 NOTE — BHH Counselor (Addendum)
 ADDENDUM CSW attempted to call Shelvy Dickens at [phone].  Call went unanswered and CSW left HIPAA compliant voicemail.  Shasta Deist, MSW, LCSW 02/23/2024 11:52 AM    CSW unable to locate or identify a contact number for APS worker Kristian Petty with [facility] DSS.  Call to main office to DSS went unanswered for over 15 minutes.   CSW sent email as address was found in patient's record, no contact number for Shelvy Dickens was located.   CSW awaiting response.  CSW contacted APS and left voicemail requesting a call back with the contact information for Shelvy Dickens if possible.    Shasta Deist, MSW, LCSW 02/23/2024 11:08 AM

## 2024-02-23 NOTE — BHH Suicide Risk Assessment (Signed)
 [facility] Discharge Suicide Risk Assessment   Principal Problem: MDD (major depressive disorder) Discharge Diagnoses: Principal Problem:   MDD (major depressive disorder)   Total Time spent with patient: 30 minutes  Musculoskeletal: Strength & Muscle Tone: within normal limits Gait & Station: normal Patient leans: N/A  Psychiatric Specialty Exam  Presentation  General Appearance:  Casual  Eye Contact: Fair  Speech: Normal Rate  Speech Volume: Normal  Handedness: Right   Mood and Affect  Mood: Euthymic  Duration of Depression Symptoms: No data recorded Affect: Appropriate   Thought Process  Thought Processes: Coherent  Descriptions of Associations:Intact  Orientation:Partial  Thought Content:Illogical  History of Schizophrenia/Schizoaffective disorder:No data recorded Duration of Psychotic Symptoms:No data recorded Hallucinations:No data recorded Ideas of Reference:None  Suicidal Thoughts:No data recorded Homicidal Thoughts:No data recorded  Sensorium  Memory: Immediate Fair; Recent Poor; Remote Poor  Judgment: Impaired  Insight: Shallow   Executive Functions  Concentration: Fair  Attention Span: Fair  Recall: Poor  Fund of Knowledge: Poor  Language: Fair   Psychomotor Activity  Psychomotor Activity:No data recorded  Assets  Assets: Desire for Improvement; Resilience   Sleep  Sleep:No data recorded  Physical Exam: Physical Exam ROS Blood pressure 130/81, pulse 88, temperature 98.1 F (36.7 C), resp. rate 18, height 5\' 10"  (1.778 m), weight 117.9 kg, SpO2 98%. Body mass index is 37.31 kg/m.  Mental Status Per Nursing Assessment::   On Admission:  Suicidal ideation indicated by patient  Demographic Factors:  Male and Low socioeconomic status  Loss Factors: Decrease in vocational status  Historical Factors: Impulsivity  Risk Reduction Factors:   Religious beliefs about death, Positive social support,  Positive therapeutic relationship, and Positive coping skills or problem solving skills  Continued Clinical Symptoms:  Bipolar Disorder:   Mixed State  Cognitive Features That Contribute To Risk:  None    Suicide Risk:  Minimal: No identifiable suicidal ideation.  Patients presenting with no risk factors but with morbid ruminations; may be classified as minimal risk based on the severity of the depressive symptoms    Plan Of Care/Follow-up recommendations:  Activity:  As tolerated  Aurelia Blotter, MD 02/23/2024, 10:10 AM

## 2024-05-15 NOTE — Congregational Nurse Program (Signed)
  Dept: [phone]   Congregational Nurse Program Note  Date of Encounter: 05/14/2024  Resident new to [facility] does not have medications for hypertension.  BP 134/84, pulse 90 and regular,  Referred to 05/15/2024 MD clinic. Past Medical History: Past Medical History:  Diagnosis Date   Hypertension     Encounter Details:  Community Questionnaire - 05/14/24 1155       Questionnaire   Ask client: Do you give verbal consent for me to treat you today? Yes    Student Assistance N/A    Location Patient Served  [facility]    Encounter Setting CN site    Population Status Unhoused    Engineer, building services or [place] Insurance    Insurance/Financial Assistance Referral N/A    Medication Have Medication Insecurities    Medical Provider No    Screening Referrals Made Annual Wellness Visit    Medical Referrals Made [facility] PCP/Clinic    Medical Appointment Completed N/A    CNP Interventions Navigate Healthcare System;Advocate/Support;Counsel;Educate    Screenings CN Performed Blood Pressure    ED Visit Averted N/A    Life-Saving Intervention Made N/A

## 2024-05-22 ENCOUNTER — Other Ambulatory Visit: Payer: Self-pay

## 2024-05-22 ENCOUNTER — Other Ambulatory Visit: Payer: Self-pay | Admitting: Critical Care Medicine

## 2024-05-22 ENCOUNTER — Encounter: Payer: Self-pay | Admitting: *Deleted

## 2024-05-22 ENCOUNTER — Encounter: Payer: Self-pay | Admitting: Critical Care Medicine

## 2024-05-22 LAB — POCT GLYCOSYLATED HEMOGLOBIN (HGB A1C): Hemoglobin-A1c: 103

## 2024-05-22 MED ORDER — TAMSULOSIN HCL 0.4 MG PO CAPS
0.4000 mg | ORAL_CAPSULE | Freq: Every day | ORAL | 3 refills | Status: AC
  Filled 2024-05-22 (×2): qty 30, 30d supply, fill #0
  Filled 2024-07-25: qty 30, 30d supply, fill #1
  Filled 2024-07-25: qty 30, 30d supply, fill #0
  Filled 2024-07-25 – 2024-08-28 (×3): qty 30, 30d supply, fill #1
  Filled 2024-09-24: qty 30, 30d supply, fill #2
  Filled 2024-10-22: qty 30, 30d supply, fill #3

## 2024-05-22 MED ORDER — AMLODIPINE BESYLATE 5 MG PO TABS
5.0000 mg | ORAL_TABLET | Freq: Every day | ORAL | 1 refills | Status: DC
  Filled 2024-05-22 (×2): qty 30, 30d supply, fill #0
  Filled 2024-07-25: qty 30, 30d supply, fill #1
  Filled 2024-07-25: qty 30, 30d supply, fill #0
  Filled 2024-07-25 – 2024-08-28 (×3): qty 30, 30d supply, fill #1

## 2024-05-22 NOTE — Progress Notes (Signed)
 Med refills

## 2024-05-22 NOTE — Congregational Nurse Program (Unsigned)
  Dept: [phone]   Congregational Nurse Program Note  Date of Encounter: 05/22/2024  Past Medical History: Past Medical History:  Diagnosis Date   Hypertension     Encounter Details:  Community Questionnaire - 05/22/24 1310       Questionnaire   Ask client: Do you give verbal consent for me to treat you today? Yes    Student Assistance N/A    Location Patient Served  [facility]    Encounter Setting CN site;Phone/Text/Email    Population Status Unhoused    Insurance [employer]    Insurance/Financial Assistance Referral N/A    Medication Have Medication Insecurities    Medical Provider No    Screening Referrals Made N/A    Medical Referrals Made [facility] PCP/Clinic    Medical Appointment Completed N/A    CNP Interventions Advocate/Support;Navigate Healthcare System;Case Management    Screenings CN Performed Blood Pressure;Blood Glucose    ED Visit Averted N/A    Life-Saving Intervention Made N/A        Client signed paper to see MD in [facility] clinic. He reports being off medication for 3 months. He said he took blood pressure, blood sugar and prostate medication in the past.Completed triage form for MD. Blood pressure (!) 136/90, pulse 92, SpO2 98%. CBG.

## 2024-05-23 ENCOUNTER — Encounter: Payer: Self-pay | Admitting: *Deleted

## 2024-05-23 ENCOUNTER — Other Ambulatory Visit: Payer: Self-pay

## 2024-05-23 NOTE — Progress Notes (Signed)
 This is a 63 year old male who just arrived at the [facility].  He has a history of catatonia major depressive disorder diabetes hypertension and benign prostatic hypertrophy.  Patient was just released from the criminal justice system after incarceration he was in [place]  He was homeless after this eventually came to [place] where he is originally from.  He just arrived and was admitted at the [facility] recently.  He is out of all his medications.  After questioning him it seems he has been on metformin 500 mg twice daily, amlodipine  10 mg daily, tamsulosin  0.4 mg daily.  The patient's had 2 admissions to behavioral health since coming to the [place] area.  He was prescribed olanzapine  trazodone  Lexapro .  He stated he had difficulty with all 3 medications and no longer is taking these.  He will need additional mental health follow-up and he does need a primary care provider  I prescribed the amlodipine  at a 5 mg daily dose is the tamsulosin  0.4 mg daily he does not need metformin his last A1c in April was 6 and recent blood sugars have been normal including today  I will get him into one of my clinic slots in September to establish for primary care

## 2024-05-23 NOTE — Congregational Nurse Program (Signed)
  Dept: [phone]   Congregational Nurse Program Note  Date of Encounter: 05/23/2024  Past Medical History: Past Medical History:  Diagnosis Date   Hypertension     Encounter Details:  Community Questionnaire - 05/23/24 1312       Questionnaire   Ask client: Do you give verbal consent for me to treat you today? Yes    Student Assistance N/A    Location Patient Served  [facility]    Encounter Setting CN site    Population Status Unhoused    Insurance Medicaid    Insurance/Financial Assistance Referral N/A    Medication Have Medication Insecurities;Patient Medications Reviewed;Provided Medication Assistance    Medical Provider No    Screening Referrals Made N/A    Medical Referrals Made N/A    Medical Appointment Completed N/A    CNP Interventions Advocate/Support;Case Management    Screenings CN Performed N/A    ED Visit Averted N/A    Life-Saving Intervention Made N/A         Picked up norvasc  and flomax  from [facility] pharmacy per MD request and brought to client at [facility]. Medication reviewed with client. He acknowledges understanding.  Caleb Leblanc W RN CN

## 2024-06-21 ENCOUNTER — Ambulatory Visit (INDEPENDENT_AMBULATORY_CARE_PROVIDER_SITE_OTHER): Payer: MEDICAID

## 2024-06-21 VITALS — BP 130/85 | HR 93 | Temp 98.1°F | Resp 16 | Ht 68.0 in | Wt 256.0 lb

## 2024-06-21 DIAGNOSIS — Z79899 Other long term (current) drug therapy: Secondary | ICD-10-CM | POA: Diagnosis not present

## 2024-06-21 DIAGNOSIS — Z7689 Persons encountering health services in other specified circumstances: Secondary | ICD-10-CM

## 2024-06-21 DIAGNOSIS — I10 Essential (primary) hypertension: Secondary | ICD-10-CM

## 2024-06-21 NOTE — Progress Notes (Unsigned)
 Patient ID: Caleb Leblanc, male    DOB: 29-Apr-1961  MRN: [id number]  CC: Establish Care   Subjective: Justice Milliron is a 63 y.o. male with past medical history of hypertension who presents to clinic to establish care. Pt reports he is living in a shelter at this time. Has multiple chronic health concerns that he will like to address after establishing care. Reports he is taking all medications as directed and does not need any refills at this time.    Patient Active Problem List   Diagnosis Date Noted   MDD (major depressive disorder) 02/01/2024   Catatonia 01/20/2024   HTN (hypertension) 01/18/2024   Acute encephalopathy 01/18/2024   AMS (altered mental status) 08/16/2020   Bilateral carpal tunnel syndrome 09/09/2019   Pain in both hands 07/19/2019   OSA (obstructive sleep apnea) 02/21/2018   Morbid obesity due to excess calories (HCC) 02/21/2018     Current Outpatient Medications on File Prior to Visit  Medication Sig Dispense Refill   amLODipine  (NORVASC ) 5 MG tablet Take 1 tablet (5 mg total) by mouth daily. 60 tablet 1   cyclobenzaprine (FLEXERIL) 10 MG tablet Take 10 mg by mouth.     naproxen  (NAPROSYN ) 500 MG tablet Take 500 mg by mouth.     tamsulosin  (FLOMAX ) 0.4 MG CAPS capsule Take 1 capsule (0.4 mg total) by mouth daily. 60 capsule 3   No current facility-administered medications on file prior to visit.    Allergies  Allergen Reactions   Augmentin [Amoxicillin-Pot Clavulanate] Anaphylaxis    Social History   Socioeconomic History   Marital status: Widowed    Spouse name: Not on file   Number of children: Not on file   Years of education: Not on file   Highest education level: Not on file  Occupational History   Not on file  Tobacco Use   Smoking status: Former    Current packs/day: 0.00    Types: Cigarettes    Quit date: 09/09/2017    Years since quitting: 6.7   Smokeless tobacco: Never   Tobacco comments:    pt states he smoked 4 cigs/day  before he quit  Vaping Use   Vaping status: Never Used  Substance and Sexual Activity   Alcohol  use: Not Currently   Drug use: Never   Sexual activity: Not on file  Other Topics Concern   Not on file  Social History Narrative   Not on file   Social Drivers of Health   Financial Resource Strain: Medium Risk (06/21/2024)   Overall Financial Resource Strain (CARDIA)    Difficulty of Paying Living Expenses: Somewhat hard  Food Insecurity: Food Insecurity Present (02/04/2024)   Hunger Vital Sign    Worried About Running Out of Food in the Last Year: Often true    Ran Out of Food in the Last Year: Often true  Transportation Needs: Unmet Transportation Needs (02/04/2024)   PRAPARE - Transportation    Lack of Transportation (Medical): Yes    Lack of Transportation (Non-Medical): Yes  Physical Activity: Sufficiently Active (06/21/2024)   Exercise Vital Sign    Days of Exercise per Week: 7 days    Minutes of Exercise per Session: 60 min  Stress: Not on file  Social Connections: Socially Isolated (01/18/2024)   Social Connection and Isolation Panel    Frequency of Communication with Friends and Family: Never    Frequency of Social Gatherings with Friends and Family: Never    Attends Religious Services:  Never    Active Member of Clubs or Organizations: No    Attends Banker Meetings: Never    Marital Status: Never married  Intimate Partner Violence: Not At Risk (02/04/2024)   Humiliation, Afraid, Rape, and Kick questionnaire    Fear of Current or Ex-Partner: No    Emotionally Abused: No    Physically Abused: No    Sexually Abused: No    No family history on file.  Past Surgical History:  Procedure Laterality Date   ABDOMINAL SURGERY      ROS: Review of Systems Negative except as stated above  PHYSICAL EXAM: BP 130/85   Pulse 93   Temp 98.1 F (36.7 C) (Oral)   Resp 16   Ht 5' 8 (1.727 m)   Wt 256 lb (116.1 kg)   SpO2 94%   BMI 38.92 kg/m   Physical  Exam  General: well-appearing, no acute distress Skin: no jaundice, rashes, or lesions Cardiovascular: regular heart rate and rhythm, normal S1/S2, no murmurs, gallops, or rubs, peripheral pulses 2+ bilaterally Chest: no skeletal deformity, lungs clear to auscultation bilaterally, equal breath sounds bilaterally Abdomen: soft, non-distended, non-tender to palpation, no hepatomegaly, no splenomegaly, normoactive bowel sounds Extremities: no peripheral edema    ASSESSMENT AND PLAN:  There are no diagnoses linked to this encounter.   Patient was given the opportunity to ask questions.  Patient verbalized understanding of the plan and was able to repeat key elements of the plan.    1. Encounter to establish care with new provider (Primary) - Plan to follow up in a month for yearly physical and labs.   2. Hypertension, unspecified type - BP: 130/85 which is at goal - Continue amlodipine  5mg  daily     Requested Prescriptions    No prescriptions requested or ordered in this encounter    Return in about 1 month (around 07/21/2024) for physical, labs.  Sula Leavy Rode, PA-C

## 2024-07-08 ENCOUNTER — Ambulatory Visit (HOSPITAL_BASED_OUTPATIENT_CLINIC_OR_DEPARTMENT_OTHER): Payer: Self-pay | Admitting: Family Medicine

## 2024-07-25 ENCOUNTER — Other Ambulatory Visit (HOSPITAL_COMMUNITY): Payer: Self-pay

## 2024-07-25 ENCOUNTER — Other Ambulatory Visit: Payer: Self-pay

## 2024-08-02 ENCOUNTER — Ambulatory Visit (INDEPENDENT_AMBULATORY_CARE_PROVIDER_SITE_OTHER): Payer: MEDICAID

## 2024-08-02 VITALS — BP 129/86 | HR 95 | Temp 98.5°F | Resp 16 | Ht 68.0 in | Wt 265.0 lb

## 2024-08-02 DIAGNOSIS — Z1159 Encounter for screening for other viral diseases: Secondary | ICD-10-CM

## 2024-08-02 DIAGNOSIS — G4733 Obstructive sleep apnea (adult) (pediatric): Secondary | ICD-10-CM

## 2024-08-02 DIAGNOSIS — R4189 Other symptoms and signs involving cognitive functions and awareness: Secondary | ICD-10-CM

## 2024-08-02 DIAGNOSIS — Z13220 Encounter for screening for lipoid disorders: Secondary | ICD-10-CM

## 2024-08-02 DIAGNOSIS — Z131 Encounter for screening for diabetes mellitus: Secondary | ICD-10-CM

## 2024-08-02 DIAGNOSIS — Z23 Encounter for immunization: Secondary | ICD-10-CM

## 2024-08-02 DIAGNOSIS — Z1329 Encounter for screening for other suspected endocrine disorder: Secondary | ICD-10-CM

## 2024-08-02 DIAGNOSIS — I10 Essential (primary) hypertension: Secondary | ICD-10-CM | POA: Diagnosis not present

## 2024-08-02 DIAGNOSIS — R5383 Other fatigue: Secondary | ICD-10-CM | POA: Diagnosis not present

## 2024-08-02 DIAGNOSIS — Z130 Encounter for screening for diseases of the blood and blood-forming organs and certain disorders involving the immune mechanism: Secondary | ICD-10-CM

## 2024-08-02 DIAGNOSIS — Z114 Encounter for screening for human immunodeficiency virus [HIV]: Secondary | ICD-10-CM

## 2024-08-02 DIAGNOSIS — Z13228 Encounter for screening for other metabolic disorders: Secondary | ICD-10-CM

## 2024-08-02 NOTE — Progress Notes (Signed)
     Patient ID: Caleb Leblanc, male    DOB: April 03, 1961  MRN: [id number]  CC: Annual Exam   Subjective: Caleb Leblanc is a 63 y.o. male with past medical history of hypertension who presents to clinic for Complaints of short term memory loss for the past year.  Reports he forget things that happened the day before. Needs written reminders. Notices having a harder time with speech and word finding.  Reports increased confusion when provided with instructions or when remembering what tasks he is doing. Has trouble sleeping, pt is not able to stay asleep.  Was previously using a CPAP but no longer has one.  Denies trauma, falls or recent injuries in the past year.   No chest pain, shortness of breath Allergies  Allergen Reactions   Augmentin [Amoxicillin-Pot Clavulanate] Anaphylaxis    ROS: Review of Systems Negative except as stated above  PHYSICAL EXAM: BP 129/86   Pulse 95   Temp 98.5 F (36.9 C) (Oral)   Resp 16   Ht 5' 8 (1.727 m)   Wt 265 lb (120.2 kg)   SpO2 97%   BMI 40.29 kg/m   Physical Exam  General: well-appearing, no acute distress Skin: no jaundice, rashes, or lesions Cardiovascular: regular heart rate and rhythm, normal S1/S2, no murmurs, gallops, or rubs, peripheral pulses 2+ bilaterally Chest: no skeletal deformity, lungs clear to auscultation bilaterally, equal breath sounds bilaterally Musculoskeletal: normal gait Extremities: no peripheral edema  ASSESSMENT AND PLAN:  1. Cognitive deficits (Primary) - Neuropsychological assessment; Future - TSH  2. OSA (obstructive sleep apnea) - Nocturnal polysomnography (NPSG); Future  3. Primary hypertension - Continue amlodipine  5 mg tablet daily  4. Other fatigue - CBC - TSH  5. Encounter for immunization - Flu vaccine trivalent PF, 6mos and older(Flulaval,Afluria,Fluarix,Fluzone )  6. Screening for blood disease - CBC  7. Encounter for lipid screening for cardiovascular disease - Lipid  panel  8. Screening for metabolic disorder - Comprehensive metabolic panel with GFR  9. Encounter for screening for HIV - HIV Antibody (routine testing w rflx)  10. Encounter for hepatitis C screening test for low risk patient - Hepatitis C antibody  11. Thyroid  disorder screen - TSH  12. Diabetes mellitus screening - POCT glycosylated hemoglobin (Hb A1C)  Patient was given the opportunity to ask questions.  Patient verbalized understanding of the plan and was able to repeat key elements of the plan.    Orders Placed This Encounter  Procedures   Flu vaccine trivalent PF, 6mos and older(Flulaval,Afluria,Fluarix,Fluzone )   CBC   Comprehensive metabolic panel with GFR   Lipid panel   HIV Antibody (routine testing w rflx)   Hepatitis C antibody   TSH   POCT glycosylated hemoglobin (Hb A1C)   Neuropsychological assessment   Nocturnal polysomnography (NPSG)     Requested Prescriptions    No prescriptions requested or ordered in this encounter    Return in about 1 month (around 09/02/2024) for physical.  Sula Leavy Rode, PA-C

## 2024-08-03 LAB — COMPREHENSIVE METABOLIC PANEL WITH GFR
ALT: 11 IU/L (ref 0–44)
AST: 14 IU/L (ref 0–40)
Albumin: 4.1 g/dL (ref 3.9–4.9)
Alkaline Phosphatase: 50 IU/L (ref 47–123)
BUN/Creatinine Ratio: 13 (ref 10–24)
BUN: 12 mg/dL (ref 8–27)
Bilirubin Total: 0.3 mg/dL (ref 0.0–1.2)
CO2: 25 mmol/L (ref 20–29)
Calcium: 8.8 mg/dL (ref 8.6–10.2)
Chloride: 101 mmol/L (ref 96–106)
Creatinine, Ser: 0.91 mg/dL (ref 0.76–1.27)
Globulin, Total: 3.4 g/dL (ref 1.5–4.5)
Glucose: 79 mg/dL (ref 70–99)
Potassium: 4.5 mmol/L (ref 3.5–5.2)
Sodium: 141 mmol/L (ref 134–144)
Total Protein: 7.5 g/dL (ref 6.0–8.5)
eGFR: 95 mL/min/1.73 (ref >59)

## 2024-08-03 LAB — CBC
Hematocrit: 46.4 % (ref 37.5–51.0)
Hemoglobin: 14.9 g/dL (ref 13.0–17.7)
MCH: 28.1 pg (ref 26.6–33.0)
MCHC: 32.1 g/dL (ref 31.5–35.7)
MCV: 88 fL (ref 79–97)
Platelets: 266 x10E3/uL (ref 150–450)
RBC: 5.3 x10E6/uL (ref 4.14–5.80)
RDW: 13.8 % (ref 11.6–15.4)
WBC: 5.2 x10E3/uL (ref 3.4–10.8)

## 2024-08-03 LAB — HEPATITIS C ANTIBODY: Hep C Virus Ab: NONREACTIVE

## 2024-08-03 LAB — LIPID PANEL
Chol/HDL Ratio: 2.8 ratio (ref 0.0–5.0)
Cholesterol, Total: 200 mg/dL — ABNORMAL HIGH (ref 100–199)
HDL: 71 mg/dL (ref >39)
LDL Chol Calc (NIH): 114 mg/dL — ABNORMAL HIGH (ref 0–99)
Triglycerides: 86 mg/dL (ref 0–149)
VLDL Cholesterol Cal: 15 mg/dL (ref 5–40)

## 2024-08-03 LAB — TSH: TSH: 1.8 u[IU]/mL (ref 0.450–4.500)

## 2024-08-03 LAB — HIV ANTIBODY (ROUTINE TESTING W REFLEX): HIV Screen 4th Generation wRfx: NONREACTIVE

## 2024-08-07 ENCOUNTER — Ambulatory Visit: Payer: Self-pay

## 2024-08-14 ENCOUNTER — Other Ambulatory Visit: Payer: Self-pay

## 2024-08-14 DIAGNOSIS — R4189 Other symptoms and signs involving cognitive functions and awareness: Secondary | ICD-10-CM

## 2024-08-21 ENCOUNTER — Other Ambulatory Visit: Payer: Self-pay

## 2024-08-21 ENCOUNTER — Telehealth: Payer: Self-pay

## 2024-08-21 NOTE — Telephone Encounter (Signed)
 Copied from CRM [id number]. Topic: Referral - Question >> Aug 21, 2024 10:11 AM Tinnie BROCKS wrote: Reason for CRM: [facility] calling to follow up on neuropsych referral. The currently referred to place does not do neuropsych, however they responded that Dr. Corina at [facility] would be a good option to transfer the referral to.  Requesting phone call and message through mychart once complete.

## 2024-08-22 ENCOUNTER — Other Ambulatory Visit: Payer: Self-pay

## 2024-08-22 NOTE — Telephone Encounter (Signed)
 Pt would like referral to Dr. Corina at [facility]

## 2024-08-27 ENCOUNTER — Other Ambulatory Visit: Payer: Self-pay

## 2024-08-28 ENCOUNTER — Telehealth: Payer: Self-pay

## 2024-08-28 ENCOUNTER — Other Ambulatory Visit (HOSPITAL_COMMUNITY): Payer: Self-pay

## 2024-08-28 NOTE — Telephone Encounter (Signed)
 Copied from CRM [id number]. Topic: Referral - Question >> Aug 21, 2024 10:11 AM Tinnie BROCKS wrote: Reason for CRM: [facility] calling to follow up on neuropsych referral. The currently referred to place does not do neuropsych, however they responded that Dr. Corina at [facility] would be a good option to transfer the referral to.  Requesting phone call and message through mychart once complete. >> Aug 28, 2024 12:05 PM Amy B wrote: Patient states he has not heard back from anyone regarding his referral.  Please refax referral to Dr. Corina.

## 2024-08-28 NOTE — Telephone Encounter (Signed)
 Referral was been routed to suggested office

## 2024-09-11 ENCOUNTER — Other Ambulatory Visit: Payer: Self-pay

## 2024-09-11 ENCOUNTER — Ambulatory Visit (INDEPENDENT_AMBULATORY_CARE_PROVIDER_SITE_OTHER): Payer: MEDICAID

## 2024-09-11 VITALS — BP 153/91 | HR 79 | Temp 98.2°F | Resp 16 | Ht 68.0 in | Wt 273.6 lb

## 2024-09-11 DIAGNOSIS — I10 Essential (primary) hypertension: Secondary | ICD-10-CM | POA: Diagnosis not present

## 2024-09-11 DIAGNOSIS — R4189 Other symptoms and signs involving cognitive functions and awareness: Secondary | ICD-10-CM

## 2024-09-11 DIAGNOSIS — Z0000 Encounter for general adult medical examination without abnormal findings: Secondary | ICD-10-CM | POA: Diagnosis not present

## 2024-09-11 MED ORDER — LOSARTAN POTASSIUM 50 MG PO TABS
50.0000 mg | ORAL_TABLET | Freq: Every day | ORAL | 2 refills | Status: AC
  Filled 2024-09-11 – 2024-09-24 (×2): qty 30, 30d supply, fill #0
  Filled 2024-10-21: qty 30, 30d supply, fill #1

## 2024-09-11 MED ORDER — AMLODIPINE BESYLATE 5 MG PO TABS
5.0000 mg | ORAL_TABLET | Freq: Every day | ORAL | 2 refills | Status: AC
  Filled 2024-09-11 – 2024-09-24 (×2): qty 30, 30d supply, fill #0
  Filled 2024-10-22: qty 30, 30d supply, fill #1

## 2024-09-11 NOTE — Progress Notes (Signed)
 Patient ID: Caleb Leblanc, male    DOB: 10-31-1960  MRN: [id number]  CC: Annual Exam   Subjective: Caleb Leblanc is a 63 y.o. male with past medical history of hypertension who presents to clinic for annual physical exam. No changes since last visit.  Previously reported cognitive deficit symptoms but has not received call to schedule an appointment for neuro psych evaluation despite referral order placed.   From previous note: Complaints of short term memory loss for the past year.  Reports he forget things that happened the day before. Needs written reminders. Notices having a harder time with speech and word finding.  Reports increased confusion when provided with instructions or when remembering what tasks he is doing.   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 60 capsule 3   cyclobenzaprine (FLEXERIL) 10 MG tablet Take 10 mg by mouth.     naproxen  (NAPROSYN ) 500 MG tablet Take 500 mg by mouth.     No current facility-administered medications on file prior to visit.    Allergies  Allergen Reactions   Augmentin [Amoxicillin-Pot Clavulanate] Anaphylaxis    Social History   Socioeconomic History   Marital status: Widowed    Spouse name: Not on file   Number of children: Not on file   Years of education: Not on file   Highest education level: Not on file  Occupational History   Not on file   Tobacco Use   Smoking status: Former    Current packs/day: 0.00    Types: Cigarettes    Quit date: 09/09/2017    Years since quitting: 7.0   Smokeless tobacco: Never   Tobacco comments:    pt states he smoked 4 cigs/day before he quit  Vaping Use   Vaping status: Never Used  Substance and Sexual Activity   Alcohol  use: Not Currently   Drug use: Never   Sexual activity: Not on file  Other Topics Concern   Not on file  Social History Narrative   Not on file   Social Drivers of Health   Financial Resource Strain: Medium Risk (06/21/2024)   Overall Financial Resource Strain (CARDIA)    Difficulty of Paying Living Expenses: Somewhat hard  Food Insecurity: Food Insecurity Present (02/04/2024)   Hunger Vital Sign    Worried About Running Out of Food in the Last Year: Often true    Ran Out of Food in the Last Year: Often true  Transportation Needs: Unmet Transportation Needs (02/04/2024)   PRAPARE - Transportation    Lack of Transportation (Medical): Yes    Lack of Transportation (Non-Medical): Yes  Physical Activity: Sufficiently Active (06/21/2024)   Exercise Vital Sign    Days of Exercise per Week: 7 days    Minutes of Exercise per Session: 60 min  Stress: Not on file  Social Connections: Socially Isolated (01/18/2024)   Social Connection and Isolation Panel  Frequency of Communication with Friends and Family: Never    Frequency of Social Gatherings with Friends and Family: Never    Attends Religious Services: Never    Database Administrator or Organizations: No    Attends Banker Meetings: Never    Marital Status: Never married  Intimate Partner Violence: Not At Risk (02/04/2024)   Humiliation, Afraid, Rape, and Kick questionnaire    Fear of Current or Ex-Partner: No    Emotionally Abused: No    Physically Abused: No    Sexually Abused: No    History reviewed. No pertinent family history.  Past Surgical History:  Procedure Laterality Date   ABDOMINAL  SURGERY      ROS: Review of Systems Negative except as stated above  PHYSICAL EXAM: BP (!) 153/91   Pulse 79   Temp 98.2 F (36.8 C) (Oral)   Resp 16   Ht 5' 8 (1.727 m)   Wt 273 lb 9.6 oz (124.1 kg)   SpO2 96%   BMI 41.60 kg/m   Physical Exam  General: well-appearing, no acute distress Skin: no jaundice, rashes, or lesions Head: normocephalic, no lesions, no abnormal hair distribution or loss Eyes: anicteric sclera, pupils equally round and reactive to light and accommodation, extraocular movements intact Ears: no external lesions, tympanic membrane translucent  Nose: no septal deviation, turbinates clear Throat: trachea midline, no thyromegaly, moist mucus membranes Cardiovascular: regular heart rate and rhythm, normal S1/S2, no murmurs, gallops, or rubs, peripheral pulses 2+ bilaterally Chest: no skeletal deformity, lungs clear to auscultation bilaterally, equal breath sounds bilaterally Abdomen: soft, non-distended, non-tender to palpation, no hepatomegaly, no splenomegaly, normoactive bowel sounds Musculoskeletal: strength of upper and lower extremities equal bilaterally, 5/5, normal gait Extremities: mild BLE peripheral edema, nails intact Neurologic: cranial nerves II-XII intact      Latest Ref Rng & Units 08/02/2024   11:18 AM 02/10/2024    6:35 AM 01/31/2024    5:57 PM  CMP  Glucose 70 - 99 mg/dL 79  98  899   BUN 8 - 27 mg/dL 12  12  25    Creatinine 0.76 - 1.27 mg/dL 9.08  9.07  8.87   Sodium 134 - 144 mmol/L 141  137  138   Potassium 3.5 - 5.2 mmol/L 4.5  3.8  3.4   Chloride 96 - 106 mmol/L 101  101  104   CO2 20 - 29 mmol/L 25  25  24    Calcium 8.6 - 10.2 mg/dL 8.8  8.6  8.5   Total Protein 6.0 - 8.5 g/dL 7.5  7.4  8.2   Total Bilirubin 0.0 - 1.2 mg/dL 0.3  0.6  0.8   Alkaline Phos 47 - 123 IU/L 50  37  38   AST 0 - 40 IU/L 14  30  24    ALT 0 - 44 IU/L 11  52  31    Lipid Panel     Component Value Date/Time   CHOL 200 (H) 08/02/2024 1118   TRIG 86  08/02/2024 1118   HDL 71 08/02/2024 1118   CHOLHDL 2.8 08/02/2024 1118   CHOLHDL 3.1 02/07/2024 0730   VLDL 13 02/07/2024 0730   LDLCALC 114 (H) 08/02/2024 1118    CBC    Component Value Date/Time   WBC 5.2 08/02/2024 1118   WBC 7.6 01/31/2024 1757   RBC 5.30 08/02/2024 1118   RBC 4.97 01/31/2024 1757   HGB 14.9 08/02/2024 1118   HCT 46.4 08/02/2024 1118  PLT 266 08/02/2024 1118   MCV 88 08/02/2024 1118   MCH 28.1 08/02/2024 1118   MCH 27.2 01/31/2024 1757   MCHC 32.1 08/02/2024 1118   MCHC 30.6 01/31/2024 1757   RDW 13.8 08/02/2024 1118   LYMPHSABS 1.7 01/31/2024 1757   MONOABS 0.8 01/31/2024 1757   EOSABS 0.0 01/31/2024 1757   BASOSABS 0.0 01/31/2024 1757    ASSESSMENT AND PLAN:  1. Encounter for annual wellness visit - Complete physical exam performed today, findings listed above.   2. Primary hypertension - BP: 153/91, not at goal.  - Discussed with patient benefit of adding additional medication to help lower BP to goal of <130/90.  - Given pt presents with mild lower extremity edema, plan to add losartan vs increasing amlodipine . - Continue Amlodipine  5mg  tablet daily - Start Losartan 50mg  tablet daily  3. Cognitive deficits (Primary) - Ambulatory referral to Psychology    Patient was given the opportunity to ask questions.  Patient verbalized understanding of the plan and was able to repeat key elements of the plan.    Orders Placed This Encounter  Procedures   Ambulatory referral to Psychology     Requested Prescriptions   Signed Prescriptions Disp Refills   losartan (COZAAR) 50 MG tablet 30 tablet 2    Sig: Take 1 tablet (50 mg total) by mouth daily.   amLODipine  (NORVASC ) 5 MG tablet 30 tablet 2    Sig: Take 1 tablet (5 mg total) by mouth daily.    Return in about 3 months (around 12/10/2024) for follow-up blood pressure.  Sula Leavy Rode, PA-C

## 2024-09-20 ENCOUNTER — Other Ambulatory Visit: Payer: Self-pay

## 2024-09-20 ENCOUNTER — Encounter: Payer: Self-pay | Admitting: Psychology

## 2024-09-24 ENCOUNTER — Other Ambulatory Visit (HOSPITAL_COMMUNITY): Payer: Self-pay

## 2024-09-24 ENCOUNTER — Other Ambulatory Visit: Payer: Self-pay

## 2024-11-28 ENCOUNTER — Ambulatory Visit (HOSPITAL_BASED_OUTPATIENT_CLINIC_OR_DEPARTMENT_OTHER): Payer: MEDICAID | Admitting: Pulmonary Disease

## 2024-12-11 ENCOUNTER — Ambulatory Visit: Payer: MEDICAID

## 2025-01-21 ENCOUNTER — Encounter: Payer: MEDICAID | Admitting: Psychology
